# Patient Record
Sex: Male | Born: 1950
Health system: Southern US, Community
[De-identification: ages and names within clinical notes are randomized; demographics above are authoritative.]

## PROBLEM LIST (undated history)

## (undated) DIAGNOSIS — E785 Hyperlipidemia, unspecified: Secondary | ICD-10-CM

## (undated) DIAGNOSIS — I10 Essential (primary) hypertension: Secondary | ICD-10-CM

## (undated) DIAGNOSIS — K5792 Diverticulitis of intestine, part unspecified, without perforation or abscess without bleeding: Secondary | ICD-10-CM

## (undated) DIAGNOSIS — N200 Calculus of kidney: Secondary | ICD-10-CM

## (undated) DIAGNOSIS — E7800 Pure hypercholesterolemia, unspecified: Secondary | ICD-10-CM

## (undated) DIAGNOSIS — R319 Hematuria, unspecified: Secondary | ICD-10-CM

## (undated) DIAGNOSIS — L409 Psoriasis, unspecified: Secondary | ICD-10-CM

## (undated) DIAGNOSIS — L309 Dermatitis, unspecified: Secondary | ICD-10-CM

## (undated) DIAGNOSIS — R0602 Shortness of breath: Secondary | ICD-10-CM

## (undated) DIAGNOSIS — E119 Type 2 diabetes mellitus without complications: Secondary | ICD-10-CM

## (undated) HISTORY — DX: Type 2 diabetes mellitus without complications: E11.9

## (undated) HISTORY — DX: Hyperlipidemia, unspecified: E78.5

## (undated) HISTORY — DX: Psoriasis, unspecified: L40.9

## (undated) HISTORY — DX: Calculus of kidney: N20.0

## (undated) HISTORY — PX: TRIGGER FINGER RELEASE: SHX641

## (undated) HISTORY — DX: Diverticulitis of intestine, part unspecified, without perforation or abscess without bleeding: K57.92

## (undated) HISTORY — PX: NECK SURGERY: SHX720

## (undated) HISTORY — DX: Pure hypercholesterolemia, unspecified: E78.00

## (undated) HISTORY — DX: Shortness of breath: R06.02

## (undated) HISTORY — DX: Hematuria, unspecified: R31.9

## (undated) HISTORY — DX: Essential (primary) hypertension: I10

## (undated) HISTORY — PX: LEFT HEART CATH: SHX5946

## (undated) HISTORY — DX: Dermatitis, unspecified: L30.9

---

## 2006-04-05 ENCOUNTER — Ambulatory Visit (HOSPITAL_COMMUNITY): Admission: RE | Admit: 2006-04-05 | Discharge: 2006-04-06 | Payer: Self-pay | Admitting: Neurosurgery

## 2007-01-09 ENCOUNTER — Ambulatory Visit: Payer: Self-pay | Admitting: Cardiology

## 2008-05-27 ENCOUNTER — Ambulatory Visit (HOSPITAL_COMMUNITY): Admission: RE | Admit: 2008-05-27 | Discharge: 2008-05-27 | Payer: Self-pay | Admitting: Ophthalmology

## 2011-04-27 NOTE — Op Note (Signed)
Albert Peterson, Peterson                ACCOUNT NO.:  [id number]   MEDICAL RECORD NO.:  [id number]          PATIENT TYPE:  AMB   LOCATION:  SDS                          FACILITY:  [facility]   PHYSICIAN:  Coletta Memos, M.D.     DATE OF BIRTH:  1951-11-24   DATE OF PROCEDURE:  04/05/2006  DATE OF DISCHARGE:                                 OPERATIVE REPORT   PREOPERATIVE DIAGNOSIS:  1.  Displaced disk left C6-C7.  2.  Left C7 radiculopathy.   POSTOPERATIVE DIAGNOSES:  1.  Displaced disk left C6-C7.  2.  Left C7 radiculopathy.   PROCEDURE:  1.  Anterior cervical decompression C6-C7.  2.  Arthrodesis C6-C7 with Peak interbody grafts 9 mm Synthes filled with      DBX bone putty.  3.  Anterior plating using 16 mm Synthes ACS plate with 04-VW screws.   COMPLICATIONS:  None.   SURGEON:  Coletta Memos, M.D.   ASSISTANT:  Hilda Lias, M.D.   INDICATIONS:  Albert Peterson is a gentleman who presented with severe pain  in his left upper extremity secondary to a displaced disk on MRI at C6-C7.  I recommended and he agreed to undergo operative decompression after  conservative treatment did not provide satisfactory relief.   OPERATIVE NOTE:  Albert Peterson was brought to the operating room, intubated, and  placed under a general anesthetic without difficulty.  His head was  positioned in slight extension on a horseshoe headrest.  His neck was  prepped and he was draped in sterile fashion.  I infiltrated 4 mL 0.5%  lidocaine with 1:200,000 strength epinephrine into the cervical region  starting from the midline extending to the medial border of the left  sternocleidomastoid approximately a fingerbreadth below the cricothyroid  membrane.  I opened the skin with a #10 blade and took this down through the  soft tissue to the platysma.  I dissected in a plane superior to the  platysma rostrally and caudally.  I then opened the platysma in a horizontal  fashion.  I dissected inferior to the platysma  rostrally and caudally to  free up soft tissue in the incision.  I then created an avascular plane to  the cervical spine.  I was able to appreciate the carotid artery and  retracted that laterally and the medial strap muscles medially.  I was  caudal enough that I took the omohyoid laterally, also.  I placed a spinal  needle and it appeared to be at C5-C6 but it was not a clear picture.  So, I  placed another spinal needle and that was at C4-C5 and I could, therefore,  count down to the C6-C7 level where I needed to be.  I then took a Financial risk analyst and removed what was a very large osteophyte anteriorly and bridging  the disk space at C6-C7.  I then reflected the longus colli muscles and  placed a self-retaining retractor.  I then used a 15 blade to open the disk  space and with the use of curets and Kerrison punches, 1 and 2 pituitary  rongeurs, I  removed a significant amount of disk material.  The microscope  was then brought into the operative field and again with curettes and a high-  speed drill, I removed bone, spurs, and ligament, until I was at the  posterior longitudinal ligament.  I then opened that ligament with a 1  Kerrison punch.  I then enlarged the opening using successively larger  Kerrison punches.  The thecal sac was exposed and then I decompressed both  C7 nerve roots.  I then irrigated out the wound.  I then sized first an 8 mm  Peak graft and frankly it appeared to be too loose.  I then placed a 9 mm  graft over DBX bone putty.  Distraction pins had been placed, one at C6-1  and one at C7.  They were then removed after I placed the interbody device.   I then sized the plate and used 16 mm.  Four screws were placed, first by  drilling and the screws were all self-tapping, and this was done with Dr.  Cassandria Santee assistance.  We did that until good purchase was achieved to the  plate.  I then irrigated the wound.  I then closed the wound in a layered  fashion.  Since I  was not able to see C6-C7 on the initial x-ray, I did not  take an x-ray postop.  I reapproximated the platysma, subcutaneous, and  subcuticular layers.  Dermabond was used for a sterile dressing.  The  patient tolerated procedure well.           ______________________________  Coletta Memos, M.D.     KC/MEDQ  D:  04/05/2006  T:  04/06/2006  Job:  [id number]

## 2011-09-06 LAB — BASIC METABOLIC PANEL
BUN: 14
Chloride: 106
Potassium: 3.8
Sodium: 138

## 2013-12-08 ENCOUNTER — Other Ambulatory Visit (HOSPITAL_COMMUNITY): Payer: Self-pay

## 2013-12-08 ENCOUNTER — Encounter (HOSPITAL_COMMUNITY): Payer: Self-pay | Admitting: Pharmacy Technician

## 2013-12-09 NOTE — Patient Instructions (Signed)
Your procedure is scheduled on:  12/17/13  Report to [facility] at 06:30 AM.  Call this number if you have problems the morning of surgery: [phone]   Remember:   Do not eat or drink:After Midnight.  Take these medicines the morning of surgery with A SIP OF WATER: None   Do not wear jewelry, make-up or nail polish.  Do not wear lotions, powders, or perfumes. You may wear deodorant.  Do not bring valuables to the hospital.  Contacts, dentures or bridgework may not be worn into surgery.   Patients discharged the day of surgery will not be allowed to drive home.  Special Instructions: Start using your eye drops as prescribed by your eye doctor.   Please read over the following fact sheets that you were given: Anesthesia Post-op Instructions and Care and Recovery After Surgery    Cataract Surgery  A cataract is a clouding of the lens of the eye. When a lens becomes cloudy, vision is reduced based on the degree and nature of the clouding. Surgery may be needed to improve vision. Surgery removes the cloudy lens and usually replaces it with a substitute lens (intraocular lens, IOL). LET YOUR EYE DOCTOR KNOW ABOUT:  Allergies to food or medicine.   Medicines taken including herbs, eyedrops, over-the-counter medicines, and creams.   Use of steroids (by mouth or creams).   Previous problems with anesthetics or numbing medicine.   History of bleeding problems or blood clots.   Previous surgery.   Other health problems, including diabetes and kidney problems.   Possibility of pregnancy, if this applies.  RISKS AND COMPLICATIONS  Infection.   Inflammation of the eyeball (endophthalmitis) that can spread to both eyes (sympathetic ophthalmia).   Poor wound healing.   If an IOL is inserted, it can later fall out of proper position. This is very uncommon.   Clouding of the part of your eye that holds an IOL in place. This is called an "after-cataract." These are uncommon, but easily  treated.  BEFORE THE PROCEDURE  Do not eat or drink anything except small amounts of water for 8 to 12 before your surgery, or as directed by your caregiver.   Unless you are told otherwise, continue any eyedrops you have been prescribed.   Talk to your primary caregiver about all other medicines that you take (both prescription and non-prescription). In some cases, you may need to stop or change medicines near the time of your surgery. This is most important if you are taking blood-thinning medicine.Do not stop medicines unless you are told to do so.   Arrange for someone to drive you to and from the procedure.   Do not put contact lenses in either eye on the day of your surgery.  PROCEDURE There is more than one method for safely removing a cataract. Your doctor can explain the differences and help determine which is best for you. Phacoemulsification surgery is the most common form of cataract surgery.  An injection is given behind the eye or eyedrops are given to make this a painless procedure.   A small cut (incision) is made on the edge of the clear, dome-shaped surface that covers the front of the eye (cornea).   A tiny probe is painlessly inserted into the eye. This device gives off ultrasound waves that soften and break up the cloudy center of the lens. This makes it easier for the cloudy lens to be removed by suction.   An IOL may be implanted.  The normal lens of the eye is covered by a clear capsule. Part of that capsule is intentionally left in the eye to support the IOL.   Your surgeon may or may not use stitches to close the incision.  There are other forms of cataract surgery that require a larger incision and stiches to close the eye. This approach is taken in cases where the doctor feels that the cataract cannot be easily removed using phacoemulsification. AFTER THE PROCEDURE  When an IOL is implanted, it does not need care. It becomes a permanent part of your eye  and cannot be seen or felt.   Your doctor will schedule follow-up exams to check on your progress.   Review your other medicines with your doctor to see which can be resumed after surgery.   Use eyedrops or take medicine as prescribed by your doctor.  Document Released: 11/15/2011 Document Reviewed: 11/12/2011 [facility] Patient Information 2012 [place], [place].   PATIENT INSTRUCTIONS POST-ANESTHESIA  IMMEDIATELY FOLLOWING SURGERY:  Do not drive or operate machinery for the first twenty four hours after surgery.  Do not make any important decisions for twenty four hours after surgery or while taking narcotic pain medications or sedatives.  If you develop intractable nausea and vomiting or a severe headache please notify your doctor immediately.  FOLLOW-UP:  Please make an appointment with your surgeon as instructed. You do not need to follow up with anesthesia unless specifically instructed to do so.  WOUND CARE INSTRUCTIONS (if applicable):  Keep a dry clean dressing on the anesthesia/puncture wound site if there is drainage.  Once the wound has quit draining you may leave it open to air.  Generally you should leave the bandage intact for twenty four hours unless there is drainage.  If the epidural site drains for more than 36-48 hours please call the anesthesia department.  QUESTIONS?:  Please feel free to call your physician or the hospital operator if you have any questions, and they will be happy to assist you.

## 2013-12-11 ENCOUNTER — Encounter (HOSPITAL_COMMUNITY): Payer: Self-pay

## 2013-12-11 ENCOUNTER — Encounter (HOSPITAL_COMMUNITY)
Admission: RE | Admit: 2013-12-11 | Discharge: 2013-12-11 | Disposition: A | Discharge status: Home / Self Care | Payer: BC Managed Care – PPO | Source: Ambulatory Visit | Attending: Ophthalmology | Admitting: Ophthalmology

## 2013-12-11 ENCOUNTER — Other Ambulatory Visit: Payer: Self-pay

## 2013-12-11 DIAGNOSIS — Z01818 Encounter for other preprocedural examination: Secondary | ICD-10-CM | POA: Insufficient documentation

## 2013-12-11 DIAGNOSIS — Z01812 Encounter for preprocedural laboratory examination: Secondary | ICD-10-CM | POA: Insufficient documentation

## 2013-12-11 LAB — BASIC METABOLIC PANEL
BUN: 16 mg/dL (ref 6–23)
CO2: 26 mEq/L (ref 19–32)
Calcium: 9.5 mg/dL (ref 8.4–10.5)
Chloride: 104 mEq/L (ref 96–112)
Creatinine, Ser: 1.23 mg/dL (ref 0.50–1.35)
GFR calc Af Amer: 70 mL/min — ABNORMAL LOW (ref >90)
GFR, EST NON AFRICAN AMERICAN: 60 mL/min — AB (ref >90)
GLUCOSE: 152 mg/dL — AB (ref 70–99)
POTASSIUM: 4.3 meq/L (ref 3.7–5.3)
Sodium: 138 mEq/L (ref 137–147)

## 2013-12-11 LAB — HEMOGLOBIN AND HEMATOCRIT, BLOOD
HCT: 44.2 % (ref 39.0–52.0)
HEMOGLOBIN: 15 g/dL (ref 13.0–17.0)

## 2013-12-16 MED ORDER — PHENYLEPHRINE HCL 2.5 % OP SOLN
OPHTHALMIC | Status: AC
  Filled 2013-12-16: qty 15

## 2013-12-16 MED ORDER — LIDOCAINE HCL (PF) 1 % IJ SOLN
INTRAMUSCULAR | Status: AC
  Filled 2013-12-16: qty 2

## 2013-12-16 MED ORDER — NEOMYCIN-POLYMYXIN-DEXAMETH 3.5-10000-0.1 OP SUSP
OPHTHALMIC | Status: AC
  Filled 2013-12-16: qty 5

## 2013-12-16 MED ORDER — LIDOCAINE HCL 3.5 % OP GEL
OPHTHALMIC | Status: AC
  Filled 2013-12-16: qty 1

## 2013-12-16 MED ORDER — CYCLOPENTOLATE-PHENYLEPHRINE OP SOLN OPTIME - NO CHARGE
OPHTHALMIC | Status: AC
  Filled 2013-12-16: qty 2

## 2013-12-16 MED ORDER — TETRACAINE HCL 0.5 % OP SOLN
OPHTHALMIC | Status: AC
  Filled 2013-12-16: qty 2

## 2013-12-17 ENCOUNTER — Encounter (HOSPITAL_COMMUNITY): Payer: BC Managed Care – PPO | Admitting: Anesthesiology

## 2013-12-17 ENCOUNTER — Ambulatory Visit (HOSPITAL_COMMUNITY): Payer: BC Managed Care – PPO | Admitting: Anesthesiology

## 2013-12-17 ENCOUNTER — Encounter (HOSPITAL_COMMUNITY): Payer: Self-pay | Admitting: *Deleted

## 2013-12-17 ENCOUNTER — Ambulatory Visit (HOSPITAL_COMMUNITY)
Admission: RE | Admit: 2013-12-17 | Discharge: 2013-12-17 | Disposition: A | Discharge status: Home / Self Care | Payer: BC Managed Care – PPO | Source: Ambulatory Visit | Attending: Ophthalmology | Admitting: Ophthalmology

## 2013-12-17 ENCOUNTER — Encounter (HOSPITAL_COMMUNITY)
Admission: RE | Disposition: A | Discharge status: Home / Self Care | Payer: Self-pay | Source: Ambulatory Visit | Attending: Ophthalmology

## 2013-12-17 DIAGNOSIS — I10 Essential (primary) hypertension: Secondary | ICD-10-CM | POA: Insufficient documentation

## 2013-12-17 DIAGNOSIS — H2589 Other age-related cataract: Secondary | ICD-10-CM | POA: Insufficient documentation

## 2013-12-17 HISTORY — PX: CATARACT EXTRACTION W/PHACO: SHX586

## 2013-12-17 SURGERY — PHACOEMULSIFICATION, CATARACT, WITH IOL INSERTION
Anesthesia: Monitor Anesthesia Care | Site: Eye | Laterality: Right

## 2013-12-17 MED ORDER — BSS IO SOLN
INTRAOCULAR | Status: DC | PRN
Start: 2013-12-17 — End: 2013-12-17
  Administered 2013-12-17: 15 mL via INTRAOCULAR

## 2013-12-17 MED ORDER — LIDOCAINE HCL (PF) 1 % IJ SOLN
INTRAMUSCULAR | Status: DC | PRN
Start: 2013-12-17 — End: 2013-12-17
  Administered 2013-12-17: .4 mL

## 2013-12-17 MED ORDER — LACTATED RINGERS IV SOLN
INTRAVENOUS | Status: DC | PRN
  Administered 2013-12-17: 07:00:00 via INTRAVENOUS

## 2013-12-17 MED ORDER — FENTANYL CITRATE 0.05 MG/ML IJ SOLN: 25.0000 ug | INTRAMUSCULAR | Status: DC | PRN

## 2013-12-17 MED ORDER — FENTANYL CITRATE 0.05 MG/ML IJ SOLN
INTRAMUSCULAR | Status: AC
  Filled 2013-12-17: qty 2

## 2013-12-17 MED ORDER — EPINEPHRINE HCL 1 MG/ML IJ SOLN
INTRAMUSCULAR | Status: AC
  Filled 2013-12-17: qty 1

## 2013-12-17 MED ORDER — CYCLOPENTOLATE-PHENYLEPHRINE 0.2-1 % OP SOLN
1.0000 [drp] | OPHTHALMIC | Status: AC
  Administered 2013-12-17 (×3): 1 [drp] via OPHTHALMIC

## 2013-12-17 MED ORDER — ONDANSETRON HCL 4 MG/2ML IJ SOLN
4.0000 mg | Freq: Once | INTRAMUSCULAR | Status: AC | PRN
Start: 2013-12-17 — End: 2013-12-17

## 2013-12-17 MED ORDER — TETRACAINE HCL 0.5 % OP SOLN
1.0000 [drp] | OPHTHALMIC | Status: AC
  Administered 2013-12-17 (×3): 1 [drp] via OPHTHALMIC

## 2013-12-17 MED ORDER — FENTANYL CITRATE 0.05 MG/ML IJ SOLN
25.0000 ug | INTRAMUSCULAR | Status: AC
  Administered 2013-12-17 (×2): 25 ug via INTRAVENOUS

## 2013-12-17 MED ORDER — MIDAZOLAM HCL 2 MG/2ML IJ SOLN
INTRAMUSCULAR | Status: AC
  Filled 2013-12-17: qty 2

## 2013-12-17 MED ORDER — LIDOCAINE HCL 3.5 % OP GEL
1.0000 "application " | Freq: Once | OPHTHALMIC | Status: AC
  Administered 2013-12-17: 1 via OPHTHALMIC

## 2013-12-17 MED ORDER — NEOMYCIN-POLYMYXIN-DEXAMETH 3.5-10000-0.1 OP SUSP
OPHTHALMIC | Status: DC | PRN
  Administered 2013-12-17: 2 [drp] via OPHTHALMIC

## 2013-12-17 MED ORDER — EPINEPHRINE HCL 1 MG/ML IJ SOLN
INTRAMUSCULAR | Status: DC | PRN
  Administered 2013-12-17: 08:00:00

## 2013-12-17 MED ORDER — LACTATED RINGERS IV SOLN
INTRAVENOUS | Status: DC
Start: 2013-12-17 — End: 2013-12-19
  Administered 2013-12-17: 08:00:00 via INTRAVENOUS

## 2013-12-17 MED ORDER — PHENYLEPHRINE HCL 2.5 % OP SOLN
1.0000 [drp] | OPHTHALMIC | Status: AC
  Administered 2013-12-17 (×3): 1 [drp] via OPHTHALMIC

## 2013-12-17 MED ORDER — PROVISC 10 MG/ML IO SOLN
INTRAOCULAR | Status: DC | PRN
Start: 2013-12-17 — End: 2013-12-17
  Administered 2013-12-17: 0.85 mL via INTRAOCULAR

## 2013-12-17 MED ORDER — POVIDONE-IODINE 5 % OP SOLN
OPHTHALMIC | Status: DC | PRN
  Administered 2013-12-17: 1 via OPHTHALMIC

## 2013-12-17 MED ORDER — MIDAZOLAM HCL 2 MG/2ML IJ SOLN
1.0000 mg | INTRAMUSCULAR | Status: DC | PRN
  Administered 2013-12-17: 2 mg via INTRAVENOUS

## 2013-12-17 SURGICAL SUPPLY — 33 items
CAPSULAR TENSION RING-AMO (OPHTHALMIC RELATED) IMPLANT
CLOTH BEACON ORANGE TIMEOUT ST (SAFETY) ×2 IMPLANT
EYE SHIELD UNIVERSAL CLEAR (GAUZE/BANDAGES/DRESSINGS) ×2 IMPLANT
GLOVE BIO SURGEON STRL SZ 6.5 (GLOVE) IMPLANT
GLOVE BIO SURGEONS STRL SZ 6.5 (GLOVE)
GLOVE BIOGEL PI IND STRL 6.5 (GLOVE) IMPLANT
GLOVE BIOGEL PI IND STRL 7.0 (GLOVE) IMPLANT
GLOVE BIOGEL PI IND STRL 7.5 (GLOVE) IMPLANT
GLOVE BIOGEL PI INDICATOR 6.5 (GLOVE)
GLOVE BIOGEL PI INDICATOR 7.0 (GLOVE) ×4
GLOVE BIOGEL PI INDICATOR 7.5 (GLOVE)
GLOVE ECLIPSE 6.5 STRL STRAW (GLOVE) IMPLANT
GLOVE ECLIPSE 7.0 STRL STRAW (GLOVE) IMPLANT
GLOVE ECLIPSE 7.5 STRL STRAW (GLOVE) IMPLANT
GLOVE EXAM NITRILE LRG STRL (GLOVE) IMPLANT
GLOVE EXAM NITRILE MD LF STRL (GLOVE) IMPLANT
GLOVE SKINSENSE NS SZ6.5 (GLOVE)
GLOVE SKINSENSE NS SZ7.0 (GLOVE)
GLOVE SKINSENSE STRL SZ6.5 (GLOVE) IMPLANT
GLOVE SKINSENSE STRL SZ7.0 (GLOVE) IMPLANT
KIT VITRECTOMY (OPHTHALMIC RELATED) IMPLANT
PAD ARMBOARD 7.5X6 YLW CONV (MISCELLANEOUS) ×2 IMPLANT
PROC W NO LENS (INTRAOCULAR LENS)
PROC W SPEC LENS (INTRAOCULAR LENS)
PROCESS W NO LENS (INTRAOCULAR LENS) IMPLANT
PROCESS W SPEC LENS (INTRAOCULAR LENS) IMPLANT
RING MALYGIN (MISCELLANEOUS) IMPLANT
SIGHTPATH CAT PROC W REG LENS (Ophthalmic Related) ×3 IMPLANT
SYR TB 1ML LL NO SAFETY (SYRINGE) ×2 IMPLANT
TAPE SURG TRANSPORE 1 IN (GAUZE/BANDAGES/DRESSINGS) IMPLANT
TAPE SURGICAL TRANSPORE 1 IN (GAUZE/BANDAGES/DRESSINGS) ×2
VISCOELASTIC ADDITIONAL (OPHTHALMIC RELATED) IMPLANT
WATER STERILE IRR 250ML POUR (IV SOLUTION) ×2 IMPLANT

## 2013-12-17 NOTE — Preoperative (Signed)
Beta Blockers   Reason not to administer Beta Blockers:Not Applicable 

## 2013-12-17 NOTE — Op Note (Signed)
Date of Admission: 12/17/2013  Date of Surgery: 12/17/2013  Pre-Op Dx: Cataract  Right  Eye  Post-Op Dx: Combined Cataract  Right  Eye,  Dx Code 366.19  Surgeon: Tonny Branch, M.D.  Assistants: None  Anesthesia: Topical with MAC  Indications: Painless, progressive loss of vision with compromise of daily activities.  Surgery: Cataract Extraction with Intraocular lens Implant Right Eye  Discription: The patient had dilating drops and viscous lidocaine placed into the right eye in the pre-op holding area. After transfer to the operating room, a time out was performed. The patient was then prepped and draped. Beginning with a 65 degree blade a paracentesis port was made at the surgeon's 2 o'clock position. The anterior chamber was then filled with 1% non-preserved lidocaine. This was followed by filling the anterior chamber with Provisc.  A 2.85mm keratome blade was used to make a clear corneal incision at the temporal limbus.  A bent cystatome needle was used to create a continuous tear capsulotomy. Hydrodissection was performed with balanced salt solution on a Fine canula. The lens nucleus was then removed using the phacoemulsification handpiece. Residual cortex was removed with the I&A handpiece. The anterior chamber and capsular bag were refilled with Provisc. A posterior chamber intraocular lens was placed into the capsular bag with it's injector. The implant was positioned with the Kuglan hook. The Provisc was then removed from the anterior chamber and capsular bag with the I&A handpiece. Stromal hydration of the main incision and paracentesis port was performed with BSS on a Fine canula. The wounds were tested for leak which was negative. The patient tolerated the procedure well. There were no operative complications. The patient was then transferred to the recovery room in stable condition.  Complications: None  Specimen: None  EBL: None  Prosthetic device: B&L enVista, MX60, power 19.0D, SN  [id number].

## 2013-12-17 NOTE — Anesthesia Postprocedure Evaluation (Signed)
  Anesthesia Post-op Note  Patient: Albert Peterson  Procedure(s) Performed: Procedure(s): CATARACT EXTRACTION PHACO AND INTRAOCULAR LENS PLACEMENT (IOC) CDE=13.21 (Right)  Patient Location: Short Stay  Anesthesia Type:MAC  Level of Consciousness: awake, alert , oriented and patient cooperative  Airway and Oxygen Therapy: Patient Spontanous Breathing  Post-op Pain: none  Post-op Assessment: Post-op Vital signs reviewed, Patient's Cardiovascular Status Stable, Respiratory Function Stable, Patent Airway, No signs of Nausea or vomiting and Pain level controlled  Post-op Vital Signs: Reviewed and stable  Complications: No apparent anesthesia complications

## 2013-12-17 NOTE — Anesthesia Preprocedure Evaluation (Signed)
Anesthesia Evaluation  Patient identified by MRN, date of birth, ID band Patient awake    Reviewed: Allergy & Precautions, H&P , NPO status , Patient's Chart, lab work & pertinent test results  Airway Mallampati: II TM Distance: >3 FB     Dental  (+) Edentulous Upper and Edentulous Lower   Pulmonary former smoker,  breath sounds clear to auscultation        Cardiovascular hypertension (not taking meds), Rhythm:Regular Rate:Normal     Neuro/Psych    GI/Hepatic negative GI ROS,   Endo/Other    Renal/GU      Musculoskeletal   Abdominal   Peds  Hematology   Anesthesia Other Findings   Reproductive/Obstetrics                           Anesthesia Physical Anesthesia Plan  ASA: II  Anesthesia Plan: MAC   Post-op Pain Management:    Induction: Intravenous  Airway Management Planned: Nasal Cannula  Additional Equipment:   Intra-op Plan:   Post-operative Plan:   Informed Consent: I have reviewed the patients History and Physical, chart, labs and discussed the procedure including the risks, benefits and alternatives for the proposed anesthesia with the patient or authorized representative who has indicated his/her understanding and acceptance.     Plan Discussed with:   Anesthesia Plan Comments:         Anesthesia Quick Evaluation

## 2013-12-17 NOTE — Anesthesia Procedure Notes (Signed)
Procedure Name: MAC Date/Time: 12/17/2013 7:59 AM Performed by: Andree Elk, AMY A Pre-anesthesia Checklist: Patient identified, Timeout performed, Emergency Drugs available, Suction available and Patient being monitored Patient Re-evaluated:Patient Re-evaluated prior to inductionOxygen Delivery Method: Nasal cannula

## 2013-12-17 NOTE — Transfer of Care (Signed)
Immediate Anesthesia Transfer of Care Note  Patient: Albert Peterson  Procedure(s) Performed: Procedure(s): CATARACT EXTRACTION PHACO AND INTRAOCULAR LENS PLACEMENT (IOC) CDE=13.21 (Right)  Patient Location: Short Stay  Anesthesia Type:MAC  Level of Consciousness: awake, alert , oriented and patient cooperative  Airway & Oxygen Therapy: Patient Spontanous Breathing  Post-op Assessment: Report given to PACU RN and Post -op Vital signs reviewed and stable  Post vital signs: Reviewed and stable  Complications: No apparent anesthesia complications

## 2013-12-17 NOTE — H&P (Signed)
I have reviewed the H&P, the patient was re-examined, and I have identified no interval changes in medical condition and plan of care since the history and physical of record  

## 2013-12-21 ENCOUNTER — Encounter (HOSPITAL_COMMUNITY): Payer: Self-pay | Admitting: Ophthalmology

## 2014-12-15 DIAGNOSIS — K859 Acute pancreatitis without necrosis or infection, unspecified: Secondary | ICD-10-CM | POA: Insufficient documentation

## 2015-07-25 ENCOUNTER — Encounter: Payer: Self-pay | Admitting: Pulmonary Disease

## 2015-07-26 ENCOUNTER — Encounter: Payer: Self-pay | Admitting: Pulmonary Disease

## 2015-07-26 ENCOUNTER — Ambulatory Visit (INDEPENDENT_AMBULATORY_CARE_PROVIDER_SITE_OTHER): Payer: BLUE CROSS/BLUE SHIELD | Admitting: Pulmonary Disease

## 2015-07-26 ENCOUNTER — Ambulatory Visit (INDEPENDENT_AMBULATORY_CARE_PROVIDER_SITE_OTHER)
Admission: RE | Admit: 2015-07-26 | Discharge: 2015-07-26 | Disposition: A | Discharge status: Home / Self Care | Payer: BLUE CROSS/BLUE SHIELD | Source: Ambulatory Visit | Attending: Pulmonary Disease | Admitting: Pulmonary Disease

## 2015-07-26 ENCOUNTER — Telehealth: Payer: Self-pay | Admitting: Pulmonary Disease

## 2015-07-26 VITALS — BP 130/70 | HR 65 | Ht 68.5 in | Wt 231.4 lb

## 2015-07-26 DIAGNOSIS — E785 Hyperlipidemia, unspecified: Secondary | ICD-10-CM | POA: Insufficient documentation

## 2015-07-26 DIAGNOSIS — I208 Other forms of angina pectoris: Secondary | ICD-10-CM | POA: Diagnosis not present

## 2015-07-26 DIAGNOSIS — R0600 Dyspnea, unspecified: Secondary | ICD-10-CM

## 2015-07-26 DIAGNOSIS — N281 Cyst of kidney, acquired: Secondary | ICD-10-CM | POA: Insufficient documentation

## 2015-07-26 DIAGNOSIS — I10 Essential (primary) hypertension: Secondary | ICD-10-CM | POA: Insufficient documentation

## 2015-07-26 DIAGNOSIS — I2089 Other forms of angina pectoris: Secondary | ICD-10-CM

## 2015-07-26 DIAGNOSIS — L409 Psoriasis, unspecified: Secondary | ICD-10-CM | POA: Insufficient documentation

## 2015-07-26 DIAGNOSIS — K5732 Diverticulitis of large intestine without perforation or abscess without bleeding: Secondary | ICD-10-CM | POA: Insufficient documentation

## 2015-07-26 DIAGNOSIS — N200 Calculus of kidney: Secondary | ICD-10-CM | POA: Insufficient documentation

## 2015-07-26 NOTE — Telephone Encounter (Signed)
Not entered in error.

## 2015-07-26 NOTE — Patient Instructions (Signed)
1. We are checking breathing tests to determine if you have COPD. 2. Albert Peterson doing a chest x-ray today to make sure you don't have any underlying abnormality in your lungs that could be contributing to your shortness of breath. 3. Please contact her cardiologist office in the next week or so to inquire as to their thoughts regarding Prinzmetal's angina. 4. You will return to clinic in 2-4 weeks to review your test results but please call me if you have any further questions or concerns.

## 2015-07-26 NOTE — Progress Notes (Signed)
Subjective:    Patient ID: Albert Peterson, male    DOB: 1951-05-06, 64 y.o.   MRN: [id number]  HPI He reports he started noticing dyspnea on exertion about a year ago.  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  He reports this seems to be intermittent.  He reports he has slowed down to try to prevent this.  He denies any worsening of his breathing with exposure to dust, chemical fumes, or perfumes. He denies any history of allergies or recurrent bronchitis. He denies any prior history of pneumonia. As a child he had no breathing problems. No allergies or eczema as a child. He does report his breathing seems to be worse when exposed to the increased heat & humidity recently. He has also developed an intermittent cough productive of mucus that he cannot qualify or quantify.  He denies any wheezing. He reports he can walk at least 3 blocks without stopping during a recent trip. He underwent a stress test recently that was abnormal but then underwent a left heart catheterization that was reportedly normal. He reports intermittent, "sharp" pain with exertion that is relieved with rest and resolves spontaneously. This is always associated with dyspnea as well. He denies any pleurisy. No fever, chills, or sweats. No nausea, emesis or diarrhea. He reports he has chronic back pain from "bulging discs". Denies any other joint pain, stiffness, or swelling.    Review of Systems No bruising. Has a rash on his leg that has been present for years and reportedly is "psoriasis". No dysuria or hematuria. A pertinent 14 point review of systems is negative except as per the history of presenting illness.  No Known Allergies  Current Outpatient Prescriptions on File Prior to Visit  Medication Sig Dispense Refill  . aspirin 81 MG tablet Take 81 mg by mouth daily.    Marland Kitchen lisinopril (PRINIVIL,ZESTRIL) 20 MG tablet Take 20 mg by mouth daily.    . metoprolol succinate (TOPROL-XL) 25 MG 24 hr  tablet Take 25 mg by mouth daily.     No current facility-administered medications on file prior to visit.   Past Medical History  Diagnosis Date  . Benign essential hypertension   . Hypercholesteremia   . DM2 (diabetes mellitus, type 2)   . Diverticulitis   . Eczema   . Hematuria   . Hyperlipidemia   . SOB (shortness of breath)   . Nephrolithiasis   . Psoriasis     right leg   Past Surgical History  Procedure Laterality Date  . Neck surgery    . Cataract extraction w/phaco Right 12/17/2013    Procedure: CATARACT EXTRACTION PHACO AND INTRAOCULAR LENS PLACEMENT (IOC) CDE=13.21;  Surgeon: Tonny Branch, MD;  Location: [facility];  Service: Ophthalmology;  Laterality: Right;  . Trigger finger release    . Left heart cath     Family History  Problem Relation Age of Onset  . Colon cancer Father   . Cancer Mother     ? type  . Breast cancer Maternal Aunt   . Lung disease Neg Hx    Social History   Social History  . Marital Status: Married    Spouse Name: N/A  . Number of Children: 1  . Years of Education: N/A   Occupational History  . shipping and receiving     Social History Main Topics  . Smoking status: Former Smoker -- 1.50 packs/day for 15 years    Types: Cigarettes  Start date: 12/11/1963    Quit date: 12/10/1993  . Smokeless tobacco: None     Comment: Quit smoking for 13 years at one point  . Alcohol Use: No  . Drug Use: No  . Sexual Activity: Not Asked   Other Topics Concern  . None   Social History Narrative   Originally from [place]. He moved to [place] in 1973. He has only lived here and [place]. He has traveled to [place], [place], [place], [place], [place], and states along the way to [place]. No international travel. 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 No history of exposure to exotic lumber. Has a dog at home. No bird, mold, or hot tub exposure. 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 No known asbestos exposure.         Objective:   Physical Exam Height 5' 8.5" (1.74 m), weight 231 lb 6.4 oz (104.962 kg). General:  Awake. Alert. No acute distress. Accompanied by wife today. Moderate central obesity..  Integument:  Warm & dry. No rash on exposed skin. No bruising. Lymphatics:  No appreciated cervical or supraclavicular lymphadenoapthy. HEENT:  Moist mucus membranes. No oral ulcers. No scleral injection or icterus. Endotracheal tube in place. PERRL. Cardiovascular:  Regular rate. No edema. No appreciable JVD.  Pulmonary:  Good aeration & clear to auscultation bilaterally. Symmetric chest wall expansion. No accessory muscle use. Abdomen: Soft. Normal bowel sounds. Protuberant. Grossly nontender. Musculoskeletal:  Normal bulk and tone. Hand grip strength 5/5 bilaterally. No joint deformity or effusion appreciated. Neurological:  CN 2-12 grossly in tact. No meningismus. Moving all 4 extremities equally. Symmetric patellar deep tendon reflexes. Psychiatric:  Mood and affect congruent. Speech normal rhythm, rate & tone.   LABS 12/15/14  BMP: 138/4.0/103/25/18/0.84/137/9.0 LFT: 4.0/7.2/0.5/83/16/16 CK: 94 CK-MB: 1.5 Troponin I: <0.01 BNP: 51 Amylase: 1281 Lipase: 1736 CBC: 13.8/14.7/44.5/258  IMAGING CXR PA/LAT 12/15/14 (personally reviewed by me): Low lung volumes. Questionable bilateral hilar fullness likely confounded by low lung volumes. No obvious parenchymal opacity or mass appreciated. No pleural effusion. Heart normal in size. Mediastinum otherwise normal in contour.  ABDOMINAL U/S 12/15/14 (per radiologist): Known left renal cyst. No gallbladder wall thickening or gallstone identified. No sonographic Murphy sign. Common bile duct 5 mm. Pancreas not well characterized in the setting of known pancreatitis.  CT ABDOMEN/PELVIS 12/15/14 (personally reviewed by me): Dependent atelectasis. No obvious pericardial effusion with limited views. Peripancreatic inflammation with trace free fluid identified. No evidence  of pancreatic duct dilatation. Moderate colonic diverticulosis noted. 4 mm right lower pole nephrolithiasis & 5.5 cm left lower pole renal cyst. No intraperitoneal free air.  CARDIAC NUCLEAR STRESS TEST (09/28/14): Abnormal stress test with moderate sized mild in intensity reversible perfusion defect noted in lateral wall suggesting moderate ischemia. Mildly reduced LV systolic function with calculated EF 46%. No specific wall motion abnormalities seen. Mild global hypokinesis. 1 mm horizontal ST depression noted with exercise on EKG portion. No chest pain reported. Appropriate hemodynamic response to exercise.    Assessment & Plan:  Patient is a 64 year old Caucasian male with a remote history of tobacco use but approximately a 22 year pack history. 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 Certainly the patient's history would be concerning for myocardial ischemia, but given his recent history of a normal left heart catheterization this can be safely ruled out. I remain concerned about the possibility for Prinzmetal's angina given the abnormal stress test from October  2015. Even so, the patient's dyspnea seems to be the major inciting factor for his chest discomfort. It seems to be affecting his ability to function with limited exertional/exercise capacity. COPD is certainly possible given his history but this will need to be further investigated with pulmonary function testing. I instructed the patient and his wife to contact my office if they've any further questions or concerns before his next appointment.  1. Dyspnea: Suspect underlying COPD. Checking full pulmonary function testing as well as 6 minute walk test before next appointment. Checking chest x-ray PA/LAT. 2. Angina of effort: Question possible variant angina/Prinzmetal angina. Defer to cardiology on further thoughts and recommendations. 3. Follow-up: Patient to return to clinic in 2-4 weeks to review his  results.

## 2015-08-11 ENCOUNTER — Telehealth: Payer: Self-pay | Admitting: Pulmonary Disease

## 2015-08-11 NOTE — Telephone Encounter (Signed)
I called spoke with spouse. She reports the PFT is already scheduled for 09/09/15. Nothing further needed

## 2015-09-09 ENCOUNTER — Ambulatory Visit: Payer: BLUE CROSS/BLUE SHIELD

## 2015-09-16 ENCOUNTER — Ambulatory Visit: Payer: BLUE CROSS/BLUE SHIELD | Admitting: Pulmonary Disease

## 2015-09-16 ENCOUNTER — Ambulatory Visit: Payer: BLUE CROSS/BLUE SHIELD

## 2015-10-21 ENCOUNTER — Telehealth: Payer: Self-pay | Admitting: Pulmonary Disease

## 2015-10-21 NOTE — Telephone Encounter (Signed)
That's fine with me. JN

## 2015-10-21 NOTE — Telephone Encounter (Signed)
Patient's wife says that she would like her husband to be seen the same day as the 6MW and PFT on 11/04/15. She said that she knows that Dr. Ashok Cordia is not working that day and requests that he see another provider the same day.  The only day he is off work is on Fridays.   Dr. Ashok Cordia, is it okay for patient to see another provider after the 6MW and PFT on the same day?  Please advise.

## 2015-10-21 NOTE — Telephone Encounter (Signed)
Spoke with patient's wife, she was under the impression that patient had to stop taking his inhalers a week before the test. Advised her that patient can use his inhalers, he just cannot use the inhalers the day of the appointment.  She said that she is okay with keeping the appointments the way they are scheduled.  She was just afraid that he would end up in the hospital if he couldn't use his inhalers.  Appointments staying same. Nothing further needed. Closing encounter

## 2015-11-04 ENCOUNTER — Ambulatory Visit (INDEPENDENT_AMBULATORY_CARE_PROVIDER_SITE_OTHER): Payer: BLUE CROSS/BLUE SHIELD | Admitting: Pulmonary Disease

## 2015-11-04 DIAGNOSIS — R0600 Dyspnea, unspecified: Secondary | ICD-10-CM

## 2015-11-04 DIAGNOSIS — I208 Other forms of angina pectoris: Secondary | ICD-10-CM

## 2015-11-04 DIAGNOSIS — I2089 Other forms of angina pectoris: Secondary | ICD-10-CM

## 2015-11-04 LAB — PULMONARY FUNCTION TEST
DL/VA % PRED: 114 %
DL/VA: 5.11 ml/min/mmHg/L
DLCO unc % pred: 80 %
DLCO unc: 23.95 ml/min/mmHg
FEF 25-75 POST: 4.56 L/s
FEF 25-75 Pre: 4.01 L/sec
FEF2575-%CHANGE-POST: 13 %
FEF2575-%PRED-POST: 182 %
FEF2575-%PRED-PRE: 160 %
FEV1-%Change-Post: 3 %
FEV1-%Pred-Post: 90 %
FEV1-%Pred-Pre: 87 %
FEV1-PRE: 2.76 L
FEV1-Post: 2.85 L
FEV1FVC-%CHANGE-POST: 2 %
FEV1FVC-%PRED-PRE: 115 %
FEV6-%Change-Post: 2 %
FEV6-%Pred-Post: 81 %
FEV6-%Pred-Pre: 79 %
FEV6-Post: 3.25 L
FEV6-Pre: 3.18 L
FEV6FVC-%Change-Post: 0 %
FEV6FVC-%Pred-Post: 105 %
FEV6FVC-%Pred-Pre: 104 %
FVC-%CHANGE-POST: 0 %
FVC-%PRED-POST: 76 %
FVC-%Pred-Pre: 75 %
FVC-POST: 3.25 L
FVC-PRE: 3.22 L
POST FEV1/FVC RATIO: 88 %
PRE FEV1/FVC RATIO: 86 %
Post FEV6/FVC ratio: 100 %
Pre FEV6/FVC Ratio: 99 %
RV % pred: 63 %
RV: 1.44 L
TLC % PRED: 69 %
TLC: 4.62 L

## 2015-11-04 NOTE — Progress Notes (Signed)
PFT done today. 

## 2015-11-04 NOTE — Progress Notes (Signed)
PFT 11/04/15: FVC 3.22 L (75%) FEV1 2.76 L (87%) FEV1/FVC 0.86 FEF 25-75 4.01 L (160%) no bronchodilator response TLC 4.62 L (69%) RV 63% ERV 30% DLCO uncorrected 80%  6MWT 11/04/15:  Walked 432 meters / Baseline Sat 94% on RA / Nadir Sat 94% on RA

## 2015-11-18 ENCOUNTER — Encounter: Payer: Self-pay | Admitting: Pulmonary Disease

## 2015-11-18 ENCOUNTER — Other Ambulatory Visit: Payer: BLUE CROSS/BLUE SHIELD

## 2015-11-18 ENCOUNTER — Ambulatory Visit (INDEPENDENT_AMBULATORY_CARE_PROVIDER_SITE_OTHER): Payer: BLUE CROSS/BLUE SHIELD | Admitting: Pulmonary Disease

## 2015-11-18 VITALS — BP 146/82 | HR 72 | Ht 68.0 in | Wt 230.2 lb

## 2015-11-18 DIAGNOSIS — R05 Cough: Secondary | ICD-10-CM | POA: Diagnosis not present

## 2015-11-18 DIAGNOSIS — J984 Other disorders of lung: Secondary | ICD-10-CM | POA: Diagnosis not present

## 2015-11-18 DIAGNOSIS — R0789 Other chest pain: Secondary | ICD-10-CM | POA: Diagnosis not present

## 2015-11-18 DIAGNOSIS — R059 Cough, unspecified: Secondary | ICD-10-CM

## 2015-11-18 NOTE — Patient Instructions (Signed)
1. I will contact you with your echocardiogram & test results. 2. We may need to do cardiopulmonary exercise testing depending upon the results. 3. Please call my office if you notice any new problems with your breathing before your next appointment.

## 2015-11-18 NOTE — Progress Notes (Signed)
Subjective:    Patient ID: Albert Peterson, male    DOB: 08-11-1951, 64 y.o.   MRN: [id number]  C.C.:  Follow-up for Dyspnea & Chest Pain.  HPI Dyspnea: No evidence for COPD on pulmonary function testing. Patient does have mild restriction on lung volumes. Reports he continues to cough producing a tan mucus. Recently he was treated with antibiotics with significant improvement in his dyspnea. He has now had return of his dyspnea on exertion that is intermittent. No fever, chills, or sweats. Doesn't recall if his cough resolved on the antibiotics. He did try a sample of Anoro at home without any improvement in symptoms.   Chest Pain:  He continues to have chest tightness with this dyspnea on exertion. This resolves with rest.  Review of Systems No fever, chills, or sweats. No nausea or emesis.  No Known Allergies  Current Outpatient Prescriptions on File Prior to Visit  Medication Sig Dispense Refill  . aspirin 81 MG tablet Take 81 mg by mouth daily.    Marland Kitchen lisinopril (PRINIVIL,ZESTRIL) 20 MG tablet Take 20 mg by mouth daily.    . metoprolol succinate (TOPROL-XL) 25 MG 24 hr tablet Take 25 mg by mouth daily.     No current facility-administered medications on file prior to visit.   Past Medical History  Diagnosis Date  . Benign essential hypertension   . Hypercholesteremia   . DM2 (diabetes mellitus, type 2) ([place])   . Diverticulitis   . Eczema   . Hematuria   . Hyperlipidemia   . SOB (shortness of breath)   . Nephrolithiasis   . Psoriasis     right leg   Past Surgical History  Procedure Laterality Date  . Neck surgery    . Cataract extraction w/phaco Right 12/17/2013    Procedure: CATARACT EXTRACTION PHACO AND INTRAOCULAR LENS PLACEMENT (IOC) CDE=13.21;  Surgeon: Tonny Branch, MD;  Location: [facility];  Service: Ophthalmology;  Laterality: Right;  . Trigger finger release    . Left heart cath     Family History  Problem Relation Age of Onset  . Colon cancer Father   . Cancer  Mother     ? type  . Breast cancer Maternal Aunt   . Lung disease Neg Hx    Social History   Social History  . Marital Status: Married    Spouse Name: N/A  . Number of Children: 1  . Years of Education: N/A   Occupational History  . shipping and receiving     Social History Main Topics  . Smoking status: Former Smoker -- 1.50 packs/day for 15 years    Types: Cigarettes    Start date: 12/11/1963    Quit date: 12/10/1993  . Smokeless tobacco: None     Comment: Quit smoking for 13 years at one point  . Alcohol Use: No  . Drug Use: No  . Sexual Activity: Not Asked   Other Topics Concern  . None   Social History Narrative   Originally from [place]. He moved to [place] in 1973. He has only lived here and [place]. He has traveled to [place], [place], [place], [place], [place], and states along the way to [place]. No international travel. 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 No history of exposure to exotic lumber. Has a dog at home. No bird, mold, or hot tub exposure. 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 No known asbestos exposure.  Objective:   Physical Exam BP 146/82 mmHg  Pulse 72  Ht 5\' 8"  (1.727 m)  Wt 230 lb 3.2 oz (104.418 kg)  BMI 35.01 kg/m2  SpO2 95% General:  Awake. Alert. No acute distress. Accompanied by wife today. Obese.  Integument:  Warm & dry. No rash on exposed skin. No bruising. Lymphatics:  No appreciated cervical or supraclavicular lymphadenoapthy. HEENT:  Moist mucus membranes. Minimal nasal turbinate swelling. No oral ulcers. PERRL. Cardiovascular:  Regular rate. No edema. No appreciable JVD.  Pulmonary: Clear bilaterally to auscultation. No accessory muscle use. Abdomen: Soft. Normal bowel sounds. Protuberant.  Musculoskeletal:  Normal bulk and tone. Hand grip strength 5/5 bilaterally. No joint deformity or effusion appreciated.  PFT 11/04/15: FVC 3.22 L (75%) FEV1 2.76 L (87%) FEV1/FVC 0.86 FEF 25-75 4.01 L  (160%) no bronchodilator response TLC 4.62 L (69%) RV 63% ERV 30% DLCO uncorrected 80%  6MWT 11/04/15: Walked 432 meters / Baseline Sat 94% on RA / Nadir Sat 94% on RA  IMAGING CXR PA/LAT 07/26/15 (personally reviewed by me): Low lung volumes. No nodule or opacity appreciated. Heart normal in size. Mediastinum normal in contour.  CXR PA/LAT 12/15/14 (previously reviewed by me): Low lung volumes. Questionable bilateral hilar fullness likely confounded by low lung volumes. No obvious parenchymal opacity or mass appreciated. No pleural effusion. Heart normal in size. Mediastinum otherwise normal in contour.  ABDOMINAL U/S 12/15/14 (per radiologist): Known left renal cyst. No gallbladder wall thickening or gallstone identified. No sonographic Murphy sign. Common bile duct 5 mm. Pancreas not well characterized in the setting of known pancreatitis.  CT ABDOMEN/PELVIS 12/15/14 (previously reviewed by me): Dependent atelectasis. No obvious pericardial effusion with limited views. Peripancreatic inflammation with trace free fluid identified. No evidence of pancreatic duct dilatation. Moderate colonic diverticulosis noted. 4 mm right lower pole nephrolithiasis & 5.5 cm left lower pole renal cyst. No intraperitoneal free air.  CARDIAC NUCLEAR STRESS TEST (09/28/14): Abnormal stress test with moderate sized mild in intensity reversible perfusion defect noted in lateral wall suggesting moderate ischemia. Mildly reduced LV systolic function with calculated EF 46%. No specific wall motion abnormalities seen. Mild global hypokinesis. 1 mm horizontal ST depression noted with exercise on EKG portion. No chest pain reported. Appropriate hemodynamic response to exercise.  LABS 12/15/14  BMP: 138/4.0/103/25/18/0.84/137/9.0 LFT: 4.0/7.2/0.5/83/16/16 CK: 94 CK-MB: 1.5 Troponin I: <0.01 BNP: 51 Amylase: 1281 Lipase: 1736 CBC: 13.8/14.7/44.5/258    Assessment & Plan:  64 year old male with mild restrictive lung  disease. Reports his dyspnea and chest discomfort did significantly improve during a three-week course of broad-spectrum antibiotics for treatment of his underlying MRSA cellulitis. An atypical infection is certainly possible. There is no suggestion of interstitial lung disease on his prior chest x-ray but this is certainly a possibility despite his normal carbon monoxide diffusion capacity. I reassured the patient and his wife that inhaler therapy would be of limited benefit at this time given absence of COPD on his spirometry. I instructed the patient and his wife to contact me if they have any new breathing problems before his next appointment.  1. Mild restrictive lung disease: Checking CT scan of the chest without contrast. May need cardiopulmonary exercise testing for evaluation of underlying dyspnea. 2. Other chest pain: Checking transthoracic echocardiogram. We require cardiopulmonary exercise testing depending upon this result. 3. Cough: Checking sputum culture for AFB, fungus, and bacteria. 4. Follow-up: Patient to return to clinic in 4-8 weeks or sooner if needed.

## 2015-11-21 LAB — RESPIRATORY CULTURE OR RESPIRATORY AND SPUTUM CULTURE
Culture: NORMAL
ORGANISM ID, BACTERIA: NORMAL

## 2015-11-25 ENCOUNTER — Ambulatory Visit (HOSPITAL_COMMUNITY)
Admission: RE | Admit: 2015-11-25 | Discharge: 2015-11-25 | Disposition: A | Discharge status: Home / Self Care | Payer: BLUE CROSS/BLUE SHIELD | Source: Ambulatory Visit | Attending: Pulmonary Disease | Admitting: Pulmonary Disease

## 2015-11-25 ENCOUNTER — Ambulatory Visit (HOSPITAL_BASED_OUTPATIENT_CLINIC_OR_DEPARTMENT_OTHER)
Admission: RE | Admit: 2015-11-25 | Discharge: 2015-11-25 | Disposition: A | Discharge status: Home / Self Care | Payer: BLUE CROSS/BLUE SHIELD | Source: Ambulatory Visit | Attending: Pulmonary Disease | Admitting: Pulmonary Disease

## 2015-11-25 DIAGNOSIS — I10 Essential (primary) hypertension: Secondary | ICD-10-CM | POA: Diagnosis not present

## 2015-11-25 DIAGNOSIS — R0789 Other chest pain: Secondary | ICD-10-CM | POA: Insufficient documentation

## 2015-11-25 DIAGNOSIS — J984 Other disorders of lung: Secondary | ICD-10-CM

## 2015-11-25 DIAGNOSIS — E785 Hyperlipidemia, unspecified: Secondary | ICD-10-CM | POA: Insufficient documentation

## 2015-11-25 DIAGNOSIS — E119 Type 2 diabetes mellitus without complications: Secondary | ICD-10-CM | POA: Diagnosis not present

## 2015-11-25 DIAGNOSIS — I2510 Atherosclerotic heart disease of native coronary artery without angina pectoris: Secondary | ICD-10-CM | POA: Diagnosis not present

## 2015-11-29 ENCOUNTER — Other Ambulatory Visit: Payer: Self-pay

## 2015-11-29 DIAGNOSIS — R0600 Dyspnea, unspecified: Secondary | ICD-10-CM

## 2015-12-13 LAB — FUNGUS CULTURE W SMEAR: SMEAR RESULT: NONE SEEN

## 2015-12-16 ENCOUNTER — Encounter (HOSPITAL_COMMUNITY): Payer: BLUE CROSS/BLUE SHIELD

## 2015-12-28 ENCOUNTER — Ambulatory Visit: Payer: BLUE CROSS/BLUE SHIELD | Admitting: Pulmonary Disease

## 2015-12-31 LAB — AFB CULTURE WITH SMEAR (NOT AT ARMC): ACID FAST SMEAR: NONE SEEN

## 2016-03-28 DIAGNOSIS — R404 Transient alteration of awareness: Secondary | ICD-10-CM | POA: Diagnosis not present

## 2016-03-28 DIAGNOSIS — R112 Nausea with vomiting, unspecified: Secondary | ICD-10-CM | POA: Diagnosis not present

## 2016-03-28 DIAGNOSIS — R197 Diarrhea, unspecified: Secondary | ICD-10-CM | POA: Diagnosis not present

## 2016-03-28 DIAGNOSIS — R531 Weakness: Secondary | ICD-10-CM | POA: Diagnosis not present

## 2016-05-21 ENCOUNTER — Encounter: Payer: Self-pay | Admitting: Internal Medicine

## 2016-06-19 ENCOUNTER — Ambulatory Visit: Payer: BLUE CROSS/BLUE SHIELD | Admitting: Nurse Practitioner

## 2016-06-29 ENCOUNTER — Encounter: Payer: Self-pay | Admitting: Nurse Practitioner

## 2016-06-29 ENCOUNTER — Ambulatory Visit (INDEPENDENT_AMBULATORY_CARE_PROVIDER_SITE_OTHER): Payer: BLUE CROSS/BLUE SHIELD | Admitting: Nurse Practitioner

## 2016-06-29 ENCOUNTER — Other Ambulatory Visit: Payer: Self-pay

## 2016-06-29 VITALS — BP 122/65 | HR 85 | Temp 97.9°F | Ht 68.5 in | Wt 215.6 lb

## 2016-06-29 DIAGNOSIS — R1013 Epigastric pain: Secondary | ICD-10-CM | POA: Diagnosis not present

## 2016-06-29 DIAGNOSIS — R0600 Dyspnea, unspecified: Secondary | ICD-10-CM | POA: Diagnosis not present

## 2016-06-29 NOTE — Progress Notes (Signed)
Primary Care Physician:  Monico Blitz, MD Primary Gastroenterologist:  Dr. Gala Romney  Chief Complaint  Patient presents with  . Gastroesophageal Reflux    HPI:   Albert Peterson is a 65 y.o. male who presents on referral from primary care for GERD. PCP notes reviewed, is on Protonix once daily. No colonoscopy or endoscopy notes in our system. Has received been referred to cardiology and pulmonology due to dyspnea on exertion and chest pain. Pulmonology and cardiology workup completed. Pulmonology notes dyspnea suspected underlying COPD, possible angina on exertion defer to cardiology. After pulmonary function testing noted does have mild restrictive lung volumes.  Recently seen in the emergency department at [facility] on 05/11/2016 for abdominal pain, vomiting. Vitals are stable, labs drawn show borderline normal white blood cell count at 10.2, normal hemoglobin and hematocrit of 14.9/44.9, normal kidney function with a creatinine of 1.07. Normal LFTs.  Today he states he's been having some epigastric pain as well as shortness of breath after eating. Symptoms have been ongoing for approximately 2 years. No N/V. Minimal to no epigastric discomfort. Denies esophageal burning, bitter taste. Has been taking Protonix which has helped. Denies hematochezia, melena, fever, chills. Has lost some weight intentionally. Has been seen by cardiology with stress test and cardiac cath with no stenting required. Pulmonary said mild COPD, but not a primary cause of his symptoms. Last colonoscopy "well over 5 years ago" at [facility] and was not told when to return. Has never had EGD. Before starting Protonix he would have to sit upright in bed to catch his breath; tends to eat late due to work. Denies dysphagia symptoms. Denies chest pain, dyspnea, dizziness, lightheadedness, syncope, near syncope. Denies any other upper or lower GI symptoms.  Past Medical History  Diagnosis Date  . Benign essential  hypertension   . Hypercholesteremia   . DM2 (diabetes mellitus, type 2) ([place])   . Diverticulitis   . Eczema   . Hematuria   . Hyperlipidemia   . SOB (shortness of breath)   . Nephrolithiasis     Per patient "kidney cysts" not stones  . Psoriasis     right leg    Past Surgical History  Procedure Laterality Date  . Neck surgery    . Cataract extraction w/phaco Right 12/17/2013    Procedure: CATARACT EXTRACTION PHACO AND INTRAOCULAR LENS PLACEMENT (IOC) CDE=13.21;  Surgeon: Tonny Branch, MD;  Location: [facility];  Service: Ophthalmology;  Laterality: Right;  . Trigger finger release    . Left heart cath    . Colonoscopy      At [facility] 2012 or earlier; records requested    Current Outpatient Prescriptions  Medication Sig Dispense Refill  . aspirin 81 MG tablet Take 81 mg by mouth daily.    . hydrALAZINE (APRESOLINE) 50 MG tablet Take 25 mg by mouth 2 (two) times daily.   2  . pantoprazole (PROTONIX) 40 MG tablet Take 40 mg by mouth daily.  2  . Potassium 99 MG TABS Take 99 mg by mouth daily.    . ranitidine (ZANTAC) 150 MG tablet Take 150 mg by mouth 2 (two) times daily.     No current facility-administered medications for this visit.    Allergies as of 06/29/2016  . (No Known Allergies)    Family History  Problem Relation Age of Onset  . Colon cancer Father     Unknown age of diagnosis  . Cancer Mother     ? type  . Breast  cancer Maternal Aunt   . Lung disease Neg Hx     Social History   Social History  . Marital Status: Married    Spouse Name: N/A  . Number of Children: 1  . Years of Education: N/A   Occupational History  . shipping and receiving     Social History Main Topics  . Smoking status: Former Smoker -- 1.50 packs/day for 15 years    Types: Cigarettes    Start date: 12/11/1963    Quit date: 12/10/1993  . Smokeless tobacco: Never Used     Comment: Quit smoking for 13 years at one point  . Alcohol Use: No     Comment: None currently; previous  "youth experience" as a teenager  . Drug Use: No  . Sexual Activity: Not on file   Other Topics Concern  . Not on file   Social History Narrative   Originally from [place]. He moved to [place] in 1973. He has only lived here and [place]. He has traveled to [place], [place], [place], [place], [place], and states along the way to [place]. No international travel. 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 No history of exposure to exotic lumber. Has a dog at home. No bird, mold, or hot tub exposure. 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 No known asbestos exposure.     Review of Systems: General: Negative for anorexia, weight loss, fever, chills, fatigue, weakness. ENT: Negative for hoarseness, difficulty swallowing. CV: Negative for chest pain, angina, palpitations, peripheral edema.  Respiratory: Negative for dyspnea at rest, cough, sputum, wheezing.  GI: See history of present illness. MS: Negative for excessive joint/back pain.  Derm: Negative for rash or itching.  Neuro: Negative for memory loss, confusion.  Endo: Negative for unusual weight change.  Heme: Negative for bruising or bleeding.    Physical Exam: BP 122/65 mmHg  Pulse 85  Temp(Src) 97.9 F (36.6 C) (Oral)  Ht 5' 8.5" (1.74 m)  Wt 215 lb 9.6 oz (97.796 kg)  BMI 32.30 kg/m2 General:   Alert and oriented. Pleasant and cooperative. Well-nourished and well-developed.  Head:  Normocephalic and atraumatic. Eyes:  Without icterus, sclera clear and conjunctiva pink.  Ears:  Normal auditory acuity. Cardiovascular:  S1, S2 present without murmurs appreciated. Extremities without clubbing or edema. Respiratory:  Clear to auscultation bilaterally. No wheezes, rales, or rhonchi. No distress.  Gastrointestinal:  +BS, rounded but soft, non-tender and non-distended. No HSM noted. No guarding or rebound. No masses appreciated.  Rectal:  Deferred  Musculoskalatal:  Symmetrical without gross  deformities. Neurologic:  Alert and oriented x4;  grossly normal neurologically. Psych:  Alert and cooperative. Normal mood and affect. Heme/Lymph/Immune: No excessive bruising noted.    06/29/2016 11:16 AM   Disclaimer: This note was dictated with voice recognition software. Similar sounding words can inadvertently be transcribed and may not be corrected upon review.

## 2016-06-29 NOTE — Patient Instructions (Signed)
1. Continue taking Protonix and ranitidine as we discussed. 2. We will request records from [facility] including your emergency room visit, CAT scan imaging, previous colonoscopy. 3. We will schedule your procedure for you. 4. Return for follow-up in 3 months or based on recommendations made after your procedure.

## 2016-06-29 NOTE — Assessment & Plan Note (Signed)
Patient with dyspepsia symptoms including worsening shortness of breath after eating. Denies classic symptoms such as epigastric pain, esophageal burning, nausea, vomiting. He has had an extensive workup with pulmonary and cardiology including cardiac catheterization, stress testing, pulmonary function tests. No etiology for his symptoms discovered as of yet. Protonix has seemed to help his symptoms. At this point given persistent symptoms which improved initially on PPI we will proceed with an upper endoscopy to further evaluate. Differentials include esophagitis, silent GERD, hiatal hernia, peptic ulcer disease, gastritis, duodenitis. No dysphagia.  We'll request previous colonoscopy records as well as radius emergency room notes from [facility] to include CT imaging of the abdomen. Continue PPI and H2 receptor blocker. Return for follow-up in 3 months, or based on postprocedure recommendations.  Proceed with EGD with Dr. Gala Romney in near future: the risks, benefits, and alternatives have been discussed with the patient in detail. The patient states understanding and desires to proceed.  The patient is not on any anticoagulants, anxiolytics, chronic pain medications, or antidepressants. Rare to no drinking, no drugs. Conscious sedation should be adequate for his procedure.

## 2016-06-29 NOTE — Progress Notes (Signed)
CC'D TO PCP °

## 2016-06-29 NOTE — Assessment & Plan Note (Signed)
Worsening dyspnea after eating, extensive pulmonary and cardiac workup. Possible upper GI etiology. Plan per below.

## 2016-07-24 ENCOUNTER — Encounter (HOSPITAL_COMMUNITY): Payer: Self-pay | Admitting: *Deleted

## 2016-07-26 ENCOUNTER — Encounter (HOSPITAL_COMMUNITY): Payer: Self-pay | Admitting: *Deleted

## 2016-07-26 ENCOUNTER — Ambulatory Visit (HOSPITAL_COMMUNITY)
Admission: RE | Admit: 2016-07-26 | Discharge: 2016-07-26 | Disposition: A | Discharge status: Home / Self Care | Payer: BLUE CROSS/BLUE SHIELD | Source: Ambulatory Visit | Attending: Internal Medicine | Admitting: Internal Medicine

## 2016-07-26 ENCOUNTER — Encounter (HOSPITAL_COMMUNITY)
Admission: RE | Disposition: A | Discharge status: Home / Self Care | Payer: Self-pay | Source: Ambulatory Visit | Attending: Internal Medicine

## 2016-07-26 DIAGNOSIS — E119 Type 2 diabetes mellitus without complications: Secondary | ICD-10-CM | POA: Diagnosis not present

## 2016-07-26 DIAGNOSIS — K219 Gastro-esophageal reflux disease without esophagitis: Secondary | ICD-10-CM | POA: Diagnosis not present

## 2016-07-26 DIAGNOSIS — K3189 Other diseases of stomach and duodenum: Secondary | ICD-10-CM | POA: Diagnosis not present

## 2016-07-26 DIAGNOSIS — Z79899 Other long term (current) drug therapy: Secondary | ICD-10-CM | POA: Insufficient documentation

## 2016-07-26 DIAGNOSIS — Z7982 Long term (current) use of aspirin: Secondary | ICD-10-CM | POA: Diagnosis not present

## 2016-07-26 DIAGNOSIS — K30 Functional dyspepsia: Secondary | ICD-10-CM

## 2016-07-26 DIAGNOSIS — R1013 Epigastric pain: Secondary | ICD-10-CM | POA: Insufficient documentation

## 2016-07-26 DIAGNOSIS — K2950 Unspecified chronic gastritis without bleeding: Secondary | ICD-10-CM | POA: Diagnosis not present

## 2016-07-26 DIAGNOSIS — Z87891 Personal history of nicotine dependence: Secondary | ICD-10-CM | POA: Diagnosis not present

## 2016-07-26 DIAGNOSIS — I10 Essential (primary) hypertension: Secondary | ICD-10-CM | POA: Insufficient documentation

## 2016-07-26 HISTORY — PX: BIOPSY: SHX5522

## 2016-07-26 HISTORY — PX: ESOPHAGOGASTRODUODENOSCOPY: SHX5428

## 2016-07-26 SURGERY — EGD (ESOPHAGOGASTRODUODENOSCOPY)
Anesthesia: Moderate Sedation

## 2016-07-26 MED ORDER — MEPERIDINE HCL 100 MG/ML IJ SOLN
INTRAMUSCULAR | Status: AC
  Filled 2016-07-26: qty 2

## 2016-07-26 MED ORDER — LIDOCAINE VISCOUS 2 % MT SOLN
OROMUCOSAL | Status: AC
  Filled 2016-07-26: qty 15

## 2016-07-26 MED ORDER — STERILE WATER FOR IRRIGATION IR SOLN
Status: DC | PRN
  Administered 2016-07-26: 08:00:00

## 2016-07-26 MED ORDER — MIDAZOLAM HCL 5 MG/5ML IJ SOLN
INTRAMUSCULAR | Status: AC
  Filled 2016-07-26: qty 10

## 2016-07-26 MED ORDER — MIDAZOLAM HCL 5 MG/5ML IJ SOLN
INTRAMUSCULAR | Status: DC | PRN
  Administered 2016-07-26 (×2): 1 mg via INTRAVENOUS
  Administered 2016-07-26: 2 mg via INTRAVENOUS

## 2016-07-26 MED ORDER — ONDANSETRON HCL 4 MG/2ML IJ SOLN
INTRAMUSCULAR | Status: DC | PRN
  Administered 2016-07-26: 4 mg via INTRAVENOUS

## 2016-07-26 MED ORDER — MEPERIDINE HCL 100 MG/ML IJ SOLN
INTRAMUSCULAR | Status: DC | PRN
Start: 2016-07-26 — End: 2016-07-26
  Administered 2016-07-26: 25 mg via INTRAVENOUS
  Administered 2016-07-26: 50 mg via INTRAVENOUS

## 2016-07-26 MED ORDER — SODIUM CHLORIDE 0.9 % IV SOLN
INTRAVENOUS | Status: DC
  Administered 2016-07-26: 1000 mL via INTRAVENOUS

## 2016-07-26 MED ORDER — LIDOCAINE VISCOUS 2 % MT SOLN
OROMUCOSAL | Status: DC | PRN
  Administered 2016-07-26: 3 mL via OROMUCOSAL

## 2016-07-26 MED ORDER — ONDANSETRON HCL 4 MG/2ML IJ SOLN
INTRAMUSCULAR | Status: AC
  Filled 2016-07-26: qty 2

## 2016-07-26 NOTE — H&P (View-Only) (Signed)
Primary Care Physician:  Monico Blitz, MD Primary Gastroenterologist:  Dr. Gala Romney  Chief Complaint  Patient presents with  . Gastroesophageal Reflux    HPI:   Albert Peterson is a 65 y.o. male who presents on referral from primary care for GERD. PCP notes reviewed, is on Protonix once daily. No colonoscopy or endoscopy notes in our system. Has received been referred to cardiology and pulmonology due to dyspnea on exertion and chest pain. Pulmonology and cardiology workup completed. Pulmonology notes dyspnea suspected underlying COPD, possible angina on exertion defer to cardiology. After pulmonary function testing noted does have mild restrictive lung volumes.  Recently seen in the emergency department at [facility] on 05/11/2016 for abdominal pain, vomiting. Vitals are stable, labs drawn show borderline normal white blood cell count at 10.2, normal hemoglobin and hematocrit of 14.9/44.9, normal kidney function with a creatinine of 1.07. Normal LFTs.  Today he states he's been having some epigastric pain as well as shortness of breath after eating. Symptoms have been ongoing for approximately 2 years. No N/V. Minimal to no epigastric discomfort. Denies esophageal burning, bitter taste. Has been taking Protonix which has helped. Denies hematochezia, melena, fever, chills. Has lost some weight intentionally. Has been seen by cardiology with stress test and cardiac cath with no stenting required. Pulmonary said mild COPD, but not a primary cause of his symptoms. Last colonoscopy "well over 5 years ago" at [facility] and was not told when to return. Has never had EGD. Before starting Protonix he would have to sit upright in bed to catch his breath; tends to eat late due to work. Denies dysphagia symptoms. Denies chest pain, dyspnea, dizziness, lightheadedness, syncope, near syncope. Denies any other upper or lower GI symptoms.  Past Medical History  Diagnosis Date  . Benign essential  hypertension   . Hypercholesteremia   . DM2 (diabetes mellitus, type 2) ([place])   . Diverticulitis   . Eczema   . Hematuria   . Hyperlipidemia   . SOB (shortness of breath)   . Nephrolithiasis     Per patient "kidney cysts" not stones  . Psoriasis     right leg    Past Surgical History  Procedure Laterality Date  . Neck surgery    . Cataract extraction w/phaco Right 12/17/2013    Procedure: CATARACT EXTRACTION PHACO AND INTRAOCULAR LENS PLACEMENT (IOC) CDE=13.21;  Surgeon: Tonny Branch, MD;  Location: [facility];  Service: Ophthalmology;  Laterality: Right;  . Trigger finger release    . Left heart cath    . Colonoscopy      At [facility] 2012 or earlier; records requested    Current Outpatient Prescriptions  Medication Sig Dispense Refill  . aspirin 81 MG tablet Take 81 mg by mouth daily.    . hydrALAZINE (APRESOLINE) 50 MG tablet Take 25 mg by mouth 2 (two) times daily.   2  . pantoprazole (PROTONIX) 40 MG tablet Take 40 mg by mouth daily.  2  . Potassium 99 MG TABS Take 99 mg by mouth daily.    . ranitidine (ZANTAC) 150 MG tablet Take 150 mg by mouth 2 (two) times daily.     No current facility-administered medications for this visit.    Allergies as of 06/29/2016  . (No Known Allergies)    Family History  Problem Relation Age of Onset  . Colon cancer Father     Unknown age of diagnosis  . Cancer Mother     ? type  . Breast  cancer Maternal Aunt   . Lung disease Neg Hx     Social History   Social History  . Marital Status: Married    Spouse Name: N/A  . Number of Children: 1  . Years of Education: N/A   Occupational History  . shipping and receiving     Social History Main Topics  . Smoking status: Former Smoker -- 1.50 packs/day for 15 years    Types: Cigarettes    Start date: 12/11/1963    Quit date: 12/10/1993  . Smokeless tobacco: Never Used     Comment: Quit smoking for 13 years at one point  . Alcohol Use: No     Comment: None currently; previous  "youth experience" as a teenager  . Drug Use: No  . Sexual Activity: Not on file   Other Topics Concern  . Not on file   Social History Narrative   Originally from [place]. He moved to [place] in 1973. He has only lived here and [place]. He has traveled to [place], [place], [place], [place], [place], and states along the way to [place]. No international travel. 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 No history of exposure to exotic lumber. Has a dog at home. No bird, mold, or hot tub exposure. 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 No known asbestos exposure.     Review of Systems: General: Negative for anorexia, weight loss, fever, chills, fatigue, weakness. ENT: Negative for hoarseness, difficulty swallowing. CV: Negative for chest pain, angina, palpitations, peripheral edema.  Respiratory: Negative for dyspnea at rest, cough, sputum, wheezing.  GI: See history of present illness. MS: Negative for excessive joint/back pain.  Derm: Negative for rash or itching.  Neuro: Negative for memory loss, confusion.  Endo: Negative for unusual weight change.  Heme: Negative for bruising or bleeding.    Physical Exam: BP 122/65 mmHg  Pulse 85  Temp(Src) 97.9 F (36.6 C) (Oral)  Ht 5' 8.5" (1.74 m)  Wt 215 lb 9.6 oz (97.796 kg)  BMI 32.30 kg/m2 General:   Alert and oriented. Pleasant and cooperative. Well-nourished and well-developed.  Head:  Normocephalic and atraumatic. Eyes:  Without icterus, sclera clear and conjunctiva pink.  Ears:  Normal auditory acuity. Cardiovascular:  S1, S2 present without murmurs appreciated. Extremities without clubbing or edema. Respiratory:  Clear to auscultation bilaterally. No wheezes, rales, or rhonchi. No distress.  Gastrointestinal:  +BS, rounded but soft, non-tender and non-distended. No HSM noted. No guarding or rebound. No masses appreciated.  Rectal:  Deferred  Musculoskalatal:  Symmetrical without gross  deformities. Neurologic:  Alert and oriented x4;  grossly normal neurologically. Psych:  Alert and cooperative. Normal mood and affect. Heme/Lymph/Immune: No excessive bruising noted.    06/29/2016 11:16 AM   Disclaimer: This note was dictated with voice recognition software. Similar sounding words can inadvertently be transcribed and may not be corrected upon review.

## 2016-07-26 NOTE — Interval H&P Note (Signed)
History and Physical Interval Note:  07/26/2016 7:35 AM  Albert Peterson  has presented today for surgery, with the diagnosis of dyspepsia  The various methods of treatment have been discussed with the patient and family. After consideration of risks, benefits and other options for treatment, the patient has consented to  Procedure(s) with comments: ESOPHAGOGASTRODUODENOSCOPY (EGD) (N/A) - 730  as a surgical intervention .  The patient's history has been reviewed, patient examined, no change in status, stable for surgery.  I have reviewed the patient's chart and labs.  Questions were answered to the patient's satisfaction.     Albert Peterson  No change. Patient notes twice a day PPI better than once daily for control of symptoms.  The risks, benefits, limitations, alternatives and imponderables have been reviewed with the patient. Potential for esophageal dilation, biopsy, etc. have also been reviewed.  Questions have been answered. All parties agreeable.

## 2016-07-26 NOTE — Op Note (Signed)
[facility] Patient Name: Albert Peterson Procedure Date: 07/26/2016 7:29 AM MRN: [id number] Date of Birth: [birth year] Attending MD: Norvel Richards , MD CSN: [id number] Age: 65 Admit Type: Outpatient Procedure:                Upper GI endoscopy with biopsy Indications:              Dyspepsia Providers:                Norvel Richards, MD, Jeanann Lewandowsky. Sharon Seller, RN,                            Purcell Nails. Tina Griffiths, Technician Referring MD:              Medicines:                Midazolam 4 mg IV, Meperidine 75 mg IV, Ondansetron                            4 mg IV Complications:            No immediate complications. Estimated Blood Loss:     Estimated blood loss was minimal. Procedure:                Pre-Anesthesia Assessment:                           - Prior to the procedure, a History and Physical                            was performed, and patient medications and                            allergies were reviewed. The patient's tolerance of                            previous anesthesia was also reviewed. The risks                            and benefits of the procedure and the sedation                            options and risks were discussed with the patient.                            All questions were answered, and informed consent                            was obtained. Prior Anticoagulants: The patient has                            taken no previous anticoagulant or antiplatelet                            agents. ASA Grade Assessment: II - A patient with  mild systemic disease. After reviewing the risks                            and benefits, the patient was deemed in                            satisfactory condition to undergo the procedure.                           After obtaining informed consent, the endoscope was                            passed under direct vision. Throughout the                            procedure, the  patient's blood pressure, pulse, and                            oxygen saturations were monitored continuously. The                            [id number] scope was introduced through the                            mouth, and advanced to the second part of duodenum.                            The upper GI endoscopy was accomplished without                            difficulty. The patient tolerated the procedure                            well. Scope In: [id number] AM Scope Out: 7:51:53 AM Total Procedure Duration: 0 hours 5 minutes 14 seconds  Findings:      The examined esophagus was normal.      A few localized 3 mm erosions were found in the gastric antrum. No ulcer       or infiltrative process observed. Biopsies were taken with a cold       forceps for histology. Estimated blood loss was minimal.      The duodenal bulb and second portion of the duodenum were normal. Impression:               - Normal esophagus.                           - Erosive gastropathy. Biopsied.                           - Normal duodenal bulb and second portion of the                            duodenum. Moderate Sedation:      Moderate (conscious) sedation was administered by the endoscopy nurse  and supervised by the endoscopist. The following parameters were       monitored: oxygen saturation, heart rate, blood pressure, respiratory       rate, EKG, adequacy of pulmonary ventilation, and response to care.       Total physician intraservice time was 15 minutes. Recommendation:           - Patient has a contact number available for                            emergencies. The signs and symptoms of potential                            delayed complications were discussed with the                            patient. Return to normal activities tomorrow.                            Written discharge instructions were provided to the                            patient.                           -  Advance diet as tolerated.                           - Stop Protonix for now; three-week trial of                            Dexilant (dexlansoprazole) 60 mg PO daily today.                           - Return to GI office after studies are complete.                            Further evaluation may be needed.                           - Continue present medications. Procedure Code(s):        --- Professional ---                           [phone], Esophagogastroduodenoscopy, flexible,                            transoral; with biopsy, single or multiple                           99152, Moderate sedation services provided by the                            same physician or other qualified health care                            professional performing  the diagnostic or                            therapeutic service that the sedation supports,                            requiring the presence of an independent trained                            observer to assist in the monitoring of the                            patient's level of consciousness and physiological                            status; initial 15 minutes of intraservice time,                            patient age 58 years or older Diagnosis Code(s):        --- Professional ---                           K31.89, Other diseases of stomach and duodenum                           K30, Functional dyspepsia CPT copyright 2016 American Medical Association. All rights reserved. The codes documented in this report are preliminary and upon coder review may  be revised to meet current compliance requirements. Cristopher Estimable. Cuca Benassi, MD Norvel Richards, MD 07/26/2016 8:12:50 AM This report has been signed electronically. Number of Addenda: 0

## 2016-07-26 NOTE — Discharge Instructions (Signed)
EGD Discharge instructions Please read the instructions outlined below and refer to this sheet in the next few weeks. These discharge instructions provide you with general information on caring for yourself after you leave the hospital. Your doctor may also give you specific instructions. While your treatment has been planned according to the most current medical practices available, unavoidable complications occasionally occur. If you have any problems or questions after discharge, please call your doctor. ACTIVITY  You may resume your regular activity but move at a slower pace for the next 24 hours.   Take frequent rest periods for the next 24 hours.   Walking will help expel (get rid of) the air and reduce the bloated feeling in your abdomen.   No driving for 24 hours (because of the anesthesia (medicine) used during the test).   You may shower.   Do not sign any important legal documents or operate any machinery for 24 hours (because of the anesthesia used during the test).  NUTRITION  Drink plenty of fluids.   You may resume your normal diet.   Begin with a light meal and progress to your normal diet.   Avoid alcoholic beverages for 24 hours or as instructed by your caregiver.  MEDICATIONS  You may resume your normal medications unless your caregiver tells you otherwise.  WHAT YOU CAN EXPECT TODAY  You may experience abdominal discomfort such as a feeling of fullness or gas pains.  FOLLOW-UP  Your doctor will discuss the results of your test with you.  SEEK IMMEDIATE MEDICAL ATTENTION IF ANY OF THE FOLLOWING OCCUR:  Excessive nausea (feeling sick to your stomach) and/or vomiting.   Severe abdominal pain and distention (swelling).   Trouble swallowing.   Temperature over 101 F (37.8 C).   Rectal bleeding or vomiting of blood.    Stop Protonix; begin a three-week trial of Dexilant 60 mg once daily. Go by my office for supply of free samples  Further  recommendations to follow pending review of pathology report

## 2016-07-27 ENCOUNTER — Encounter: Payer: Self-pay | Admitting: Internal Medicine

## 2016-07-31 ENCOUNTER — Telehealth: Payer: Self-pay | Admitting: Internal Medicine

## 2016-07-31 ENCOUNTER — Encounter (HOSPITAL_COMMUNITY): Payer: Self-pay | Admitting: Internal Medicine

## 2016-07-31 NOTE — Telephone Encounter (Signed)
PT WIFE STATED THAT HE WAS WORSE SINCE HE STOPPED HIS PROTONIX AND BEGAN WITH THE SAMPLES HE GOT HERE.  HE IS GOING TO TAKE PROTONIX AGAIN.  [phone]   ALSO WANTING TO HEAR  BIOPSY RESULTS IF THEY ARE READY

## 2016-08-02 NOTE — Telephone Encounter (Signed)
Communication noted; he may stay on Protonix

## 2016-08-02 NOTE — Telephone Encounter (Signed)
Spoke with [name], pts wife. Pt has not gotten letter yet. Went over letter with her. Pt said the protonix works better for him and he would prefer to stay on it.

## 2016-10-02 ENCOUNTER — Ambulatory Visit: Payer: BLUE CROSS/BLUE SHIELD | Admitting: Nurse Practitioner

## 2016-10-12 ENCOUNTER — Ambulatory Visit: Payer: BLUE CROSS/BLUE SHIELD | Admitting: Nurse Practitioner

## 2017-01-21 DIAGNOSIS — E784 Other hyperlipidemia: Secondary | ICD-10-CM | POA: Diagnosis not present

## 2017-01-21 DIAGNOSIS — I10 Essential (primary) hypertension: Secondary | ICD-10-CM | POA: Diagnosis not present

## 2017-01-21 DIAGNOSIS — K5732 Diverticulitis of large intestine without perforation or abscess without bleeding: Secondary | ICD-10-CM | POA: Diagnosis not present

## 2017-01-21 DIAGNOSIS — J4521 Mild intermittent asthma with (acute) exacerbation: Secondary | ICD-10-CM | POA: Diagnosis not present

## 2017-01-23 DIAGNOSIS — R0600 Dyspnea, unspecified: Secondary | ICD-10-CM | POA: Diagnosis not present

## 2017-01-23 DIAGNOSIS — R5383 Other fatigue: Secondary | ICD-10-CM | POA: Diagnosis not present

## 2017-05-10 DIAGNOSIS — M25512 Pain in left shoulder: Secondary | ICD-10-CM | POA: Diagnosis not present

## 2017-05-10 DIAGNOSIS — I10 Essential (primary) hypertension: Secondary | ICD-10-CM | POA: Diagnosis not present

## 2017-05-10 DIAGNOSIS — J4521 Mild intermittent asthma with (acute) exacerbation: Secondary | ICD-10-CM | POA: Diagnosis not present

## 2017-05-10 DIAGNOSIS — E784 Other hyperlipidemia: Secondary | ICD-10-CM | POA: Diagnosis not present

## 2017-05-10 DIAGNOSIS — K5732 Diverticulitis of large intestine without perforation or abscess without bleeding: Secondary | ICD-10-CM | POA: Diagnosis not present

## 2017-05-10 DIAGNOSIS — Z125 Encounter for screening for malignant neoplasm of prostate: Secondary | ICD-10-CM | POA: Diagnosis not present

## 2017-08-13 IMAGING — CT CT CHEST W/O CM
2 of 3 series · 15 of 36 positions shown, 18 images · non-contrast
Comparison: Chest radiograph July 26, 2015

CLINICAL DATA: Shortness of Breath, intermittent for 1 year.
History of restrictive type lung disease. Hypertension. Former
tobacco use.

EXAM:
CT CHEST WITHOUT CONTRAST
TECHNIQUE: Multidetector CT imaging of the chest was performed following the
standard protocol without IV contrast.

[Series 2: chestroutine 5.0 b40f · axial · 0.75mm/px · z∈[-295,-60]mm · 12 of 57 slices shown, 15 images]
[im 5/57  mediastinal]
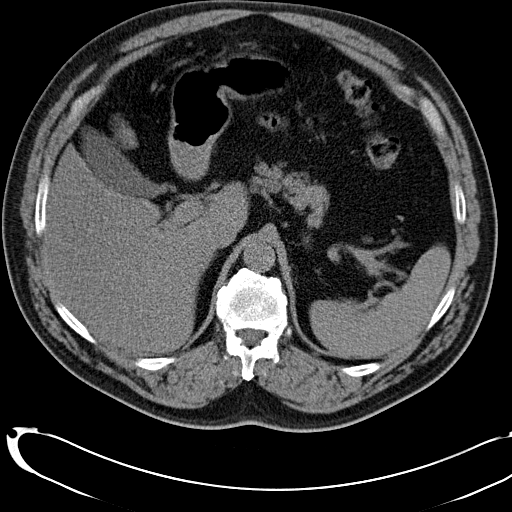
[im 5/57  lung]
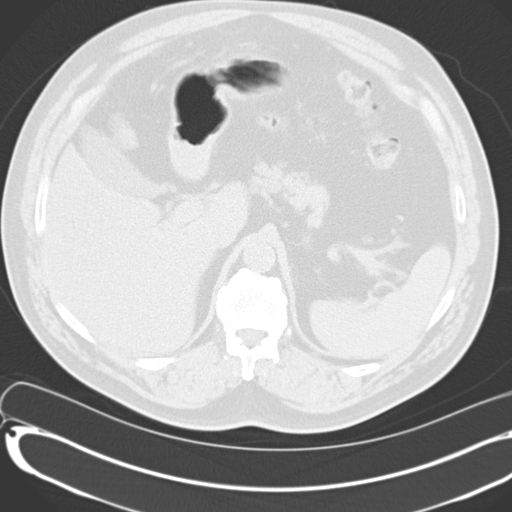
[im 9/57  lung]
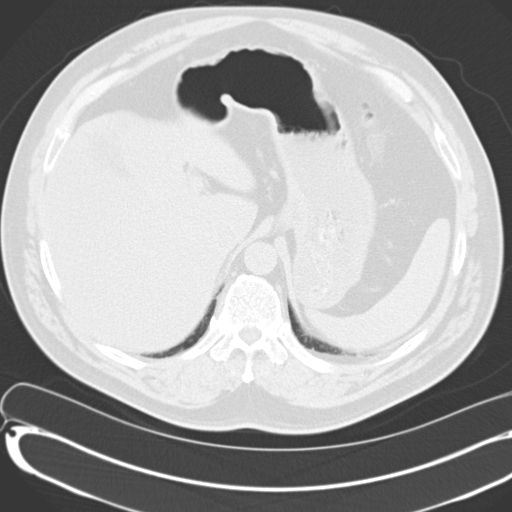
[im 13/57  lung]
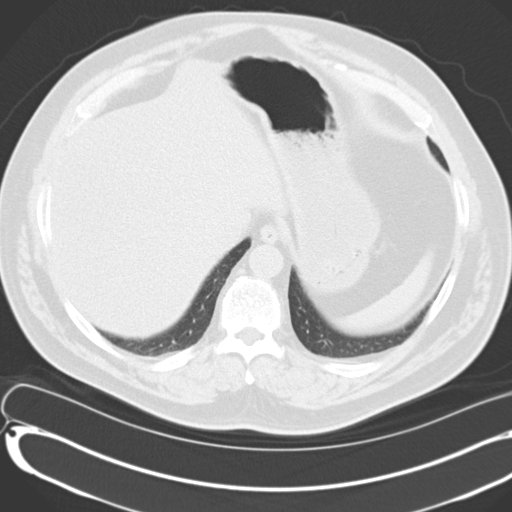
[im 17/57  lung]
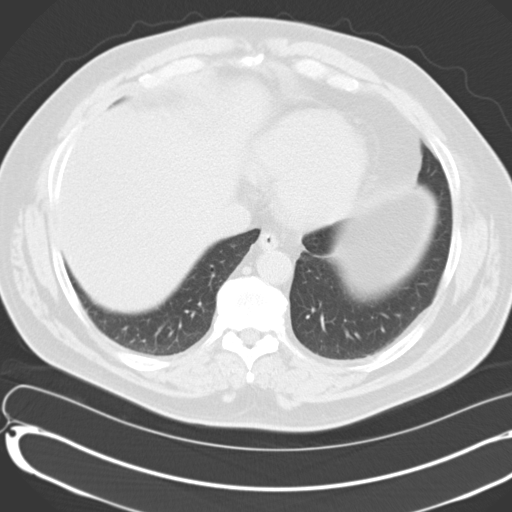
[im 21/57  mediastinal]
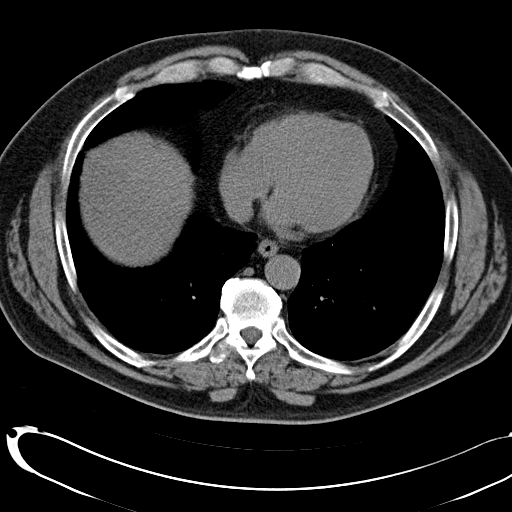
[im 21/57  lung]
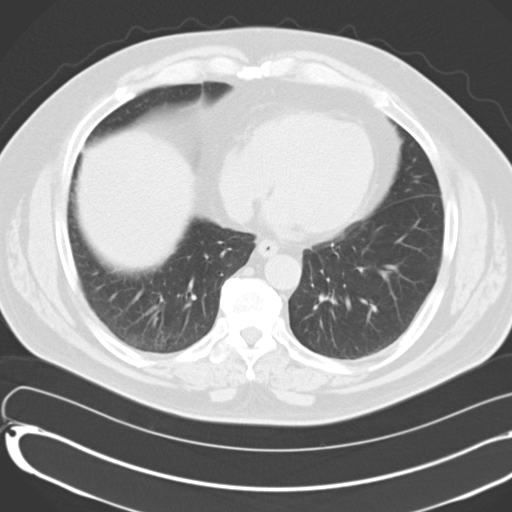
[im 25/57  lung]
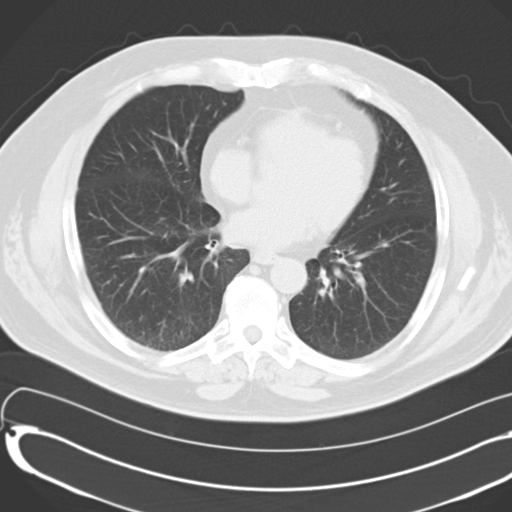
[im 32/57  lung]
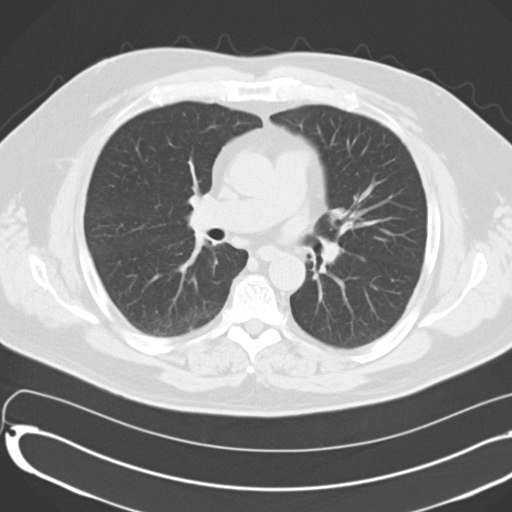
[im 36/57  lung]
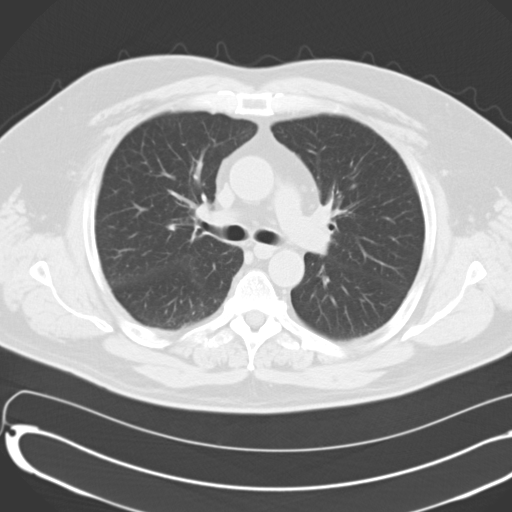
[im 40/57  mediastinal]
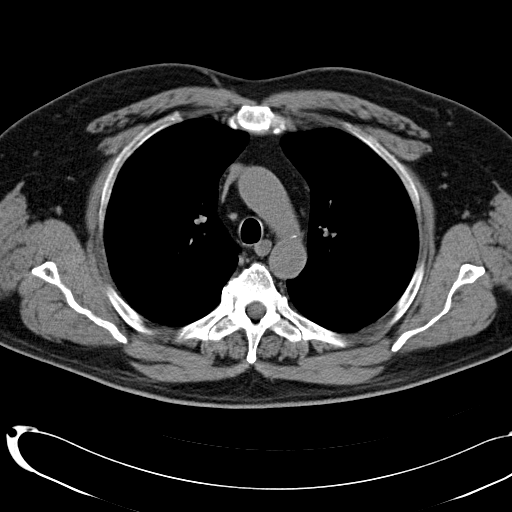
[im 40/57  lung]
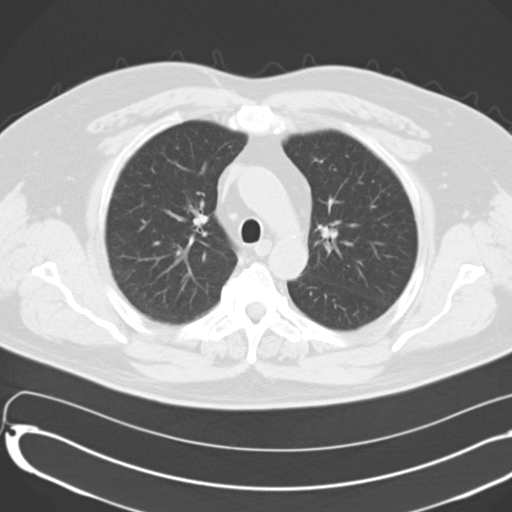
[im 44/57  lung]
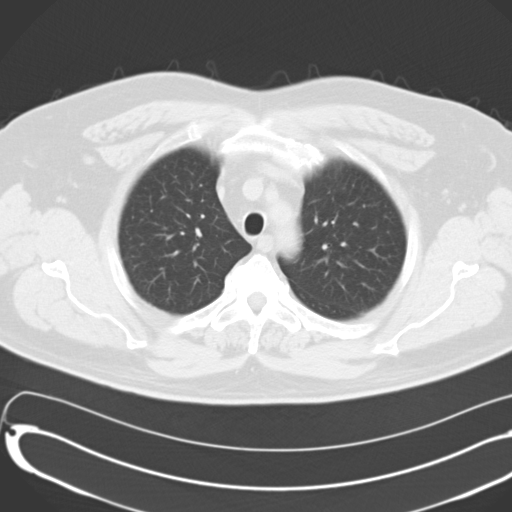
[im 48/57  lung]
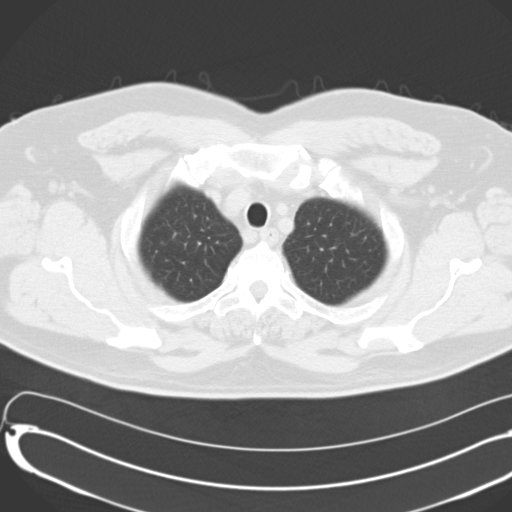
[im 52/57  lung]
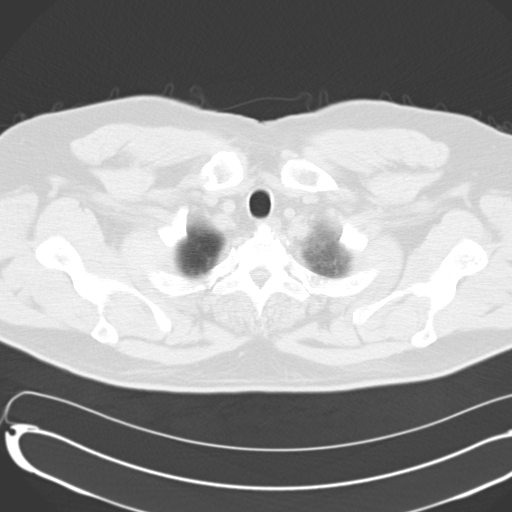

[Series 4: mpr coro 3mm · coronal · 0.57mm/px · 3 of 87 slices shown]
[im 18/87  lung]
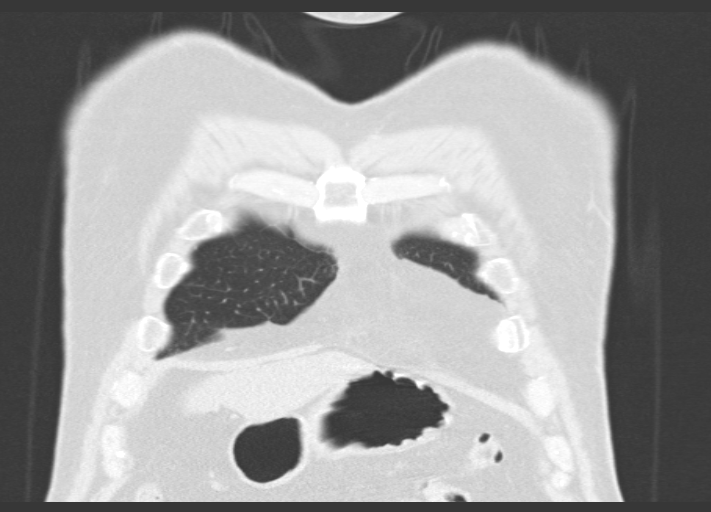
[im 35/87  lung]
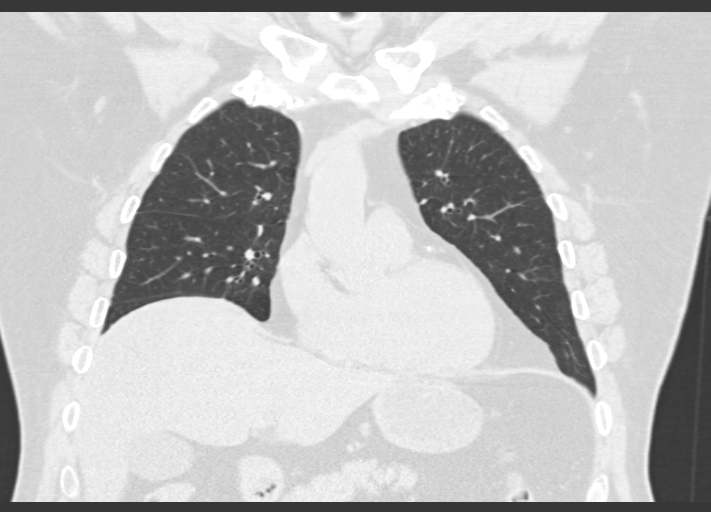
[im 52/87  lung]
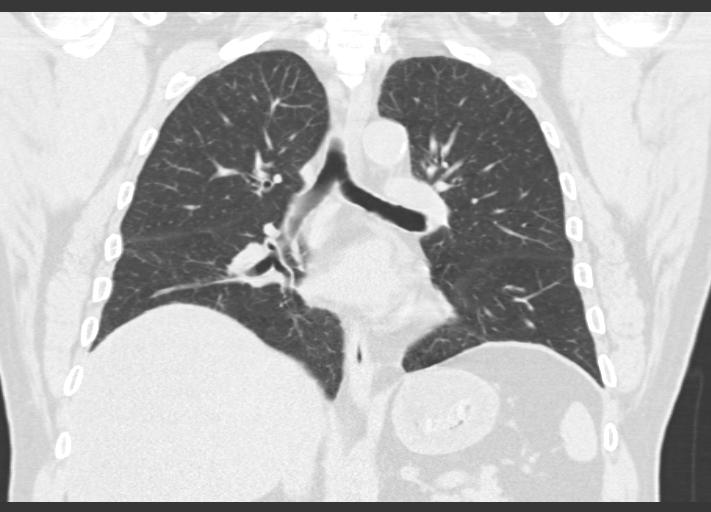

[15 of 36 positions shown; findings below may reference images not displayed]

FINDINGS: Mediastinum/Lymph Nodes: Visualized thyroid appears normal. There is
no appreciable thoracic adenopathy. There is no thoracic aortic
aneurysm. Visualized great vessels appear unremarkable on this
noncontrast enhanced study. There are scattered foci of coronary
artery calcification. The pericardium is not thickened. There is
moderate mediastinal fat.

Lungs/Pleura: There is no edema or consolidation. There is no
appreciable lobular septal thickening or honeycombing type
appearance to suggest interstitial fibrosis. No appreciable
ground-glass type appearance is evident on this study. There is no
abnormal pleural thickening or effusion.

Upper abdomen: Visualized upper abdominal structures are
unremarkable.

Musculoskeletal: There is postoperative change in the lower cervical
spine. No blastic or lytic bone lesions.
IMPRESSION: Lungs are clear. There is no evidence suggesting interstitial
fibrosis or lobular septal thickening of the interstitium. No
airspace opacity or ground-glass type opacity. No pleural effusions.

No adenopathy.

Scattered foci of coronary calcification are present.

## 2017-09-09 DIAGNOSIS — J4521 Mild intermittent asthma with (acute) exacerbation: Secondary | ICD-10-CM | POA: Diagnosis not present

## 2017-09-09 DIAGNOSIS — R7303 Prediabetes: Secondary | ICD-10-CM | POA: Diagnosis not present

## 2017-09-09 DIAGNOSIS — E782 Mixed hyperlipidemia: Secondary | ICD-10-CM | POA: Diagnosis not present

## 2017-09-09 DIAGNOSIS — I10 Essential (primary) hypertension: Secondary | ICD-10-CM | POA: Diagnosis not present

## 2017-09-09 DIAGNOSIS — Z0000 Encounter for general adult medical examination without abnormal findings: Secondary | ICD-10-CM | POA: Diagnosis not present

## 2017-10-04 DIAGNOSIS — Z23 Encounter for immunization: Secondary | ICD-10-CM | POA: Diagnosis not present

## 2017-12-23 ENCOUNTER — Encounter: Payer: Self-pay | Admitting: Cardiovascular Disease

## 2017-12-23 DIAGNOSIS — I10 Essential (primary) hypertension: Secondary | ICD-10-CM | POA: Diagnosis not present

## 2017-12-23 DIAGNOSIS — Z125 Encounter for screening for malignant neoplasm of prostate: Secondary | ICD-10-CM | POA: Diagnosis not present

## 2017-12-23 DIAGNOSIS — J4521 Mild intermittent asthma with (acute) exacerbation: Secondary | ICD-10-CM | POA: Diagnosis not present

## 2017-12-23 DIAGNOSIS — Z0000 Encounter for general adult medical examination without abnormal findings: Secondary | ICD-10-CM | POA: Diagnosis not present

## 2017-12-23 DIAGNOSIS — E7849 Other hyperlipidemia: Secondary | ICD-10-CM | POA: Diagnosis not present

## 2018-02-04 DIAGNOSIS — Z79899 Other long term (current) drug therapy: Secondary | ICD-10-CM | POA: Diagnosis not present

## 2018-02-04 DIAGNOSIS — E119 Type 2 diabetes mellitus without complications: Secondary | ICD-10-CM | POA: Diagnosis not present

## 2018-02-04 DIAGNOSIS — R079 Chest pain, unspecified: Secondary | ICD-10-CM | POA: Diagnosis not present

## 2018-02-04 DIAGNOSIS — R0602 Shortness of breath: Secondary | ICD-10-CM | POA: Diagnosis not present

## 2018-02-04 DIAGNOSIS — Z7982 Long term (current) use of aspirin: Secondary | ICD-10-CM | POA: Diagnosis not present

## 2018-02-04 DIAGNOSIS — Z87891 Personal history of nicotine dependence: Secondary | ICD-10-CM | POA: Diagnosis not present

## 2018-02-04 DIAGNOSIS — I10 Essential (primary) hypertension: Secondary | ICD-10-CM | POA: Diagnosis not present

## 2018-03-06 DIAGNOSIS — I10 Essential (primary) hypertension: Secondary | ICD-10-CM | POA: Diagnosis not present

## 2018-03-06 DIAGNOSIS — K5730 Diverticulosis of large intestine without perforation or abscess without bleeding: Secondary | ICD-10-CM | POA: Diagnosis not present

## 2018-03-06 DIAGNOSIS — M1990 Unspecified osteoarthritis, unspecified site: Secondary | ICD-10-CM | POA: Diagnosis not present

## 2018-03-06 DIAGNOSIS — J45909 Unspecified asthma, uncomplicated: Secondary | ICD-10-CM | POA: Diagnosis not present

## 2018-03-06 DIAGNOSIS — Z1211 Encounter for screening for malignant neoplasm of colon: Secondary | ICD-10-CM | POA: Diagnosis not present

## 2018-03-06 DIAGNOSIS — D122 Benign neoplasm of ascending colon: Secondary | ICD-10-CM | POA: Diagnosis not present

## 2018-03-10 DIAGNOSIS — E7849 Other hyperlipidemia: Secondary | ICD-10-CM | POA: Diagnosis not present

## 2018-03-10 DIAGNOSIS — I10 Essential (primary) hypertension: Secondary | ICD-10-CM | POA: Diagnosis not present

## 2018-03-10 DIAGNOSIS — J4521 Mild intermittent asthma with (acute) exacerbation: Secondary | ICD-10-CM | POA: Diagnosis not present

## 2018-03-10 HISTORY — PX: COLONOSCOPY: SHX174

## 2018-03-25 ENCOUNTER — Emergency Department (HOSPITAL_COMMUNITY): Payer: Medicare Other

## 2018-03-25 ENCOUNTER — Emergency Department (HOSPITAL_COMMUNITY)
Admission: EM | Admit: 2018-03-25 | Discharge: 2018-03-25 | Discharge status: Against Medical Advice | Payer: Medicare Other | Attending: Emergency Medicine | Admitting: Emergency Medicine

## 2018-03-25 ENCOUNTER — Encounter (HOSPITAL_COMMUNITY): Payer: Self-pay | Admitting: Emergency Medicine

## 2018-03-25 ENCOUNTER — Other Ambulatory Visit: Payer: Self-pay

## 2018-03-25 DIAGNOSIS — E1140 Type 2 diabetes mellitus with diabetic neuropathy, unspecified: Secondary | ICD-10-CM | POA: Insufficient documentation

## 2018-03-25 DIAGNOSIS — I10 Essential (primary) hypertension: Secondary | ICD-10-CM | POA: Insufficient documentation

## 2018-03-25 DIAGNOSIS — Z7982 Long term (current) use of aspirin: Secondary | ICD-10-CM | POA: Diagnosis not present

## 2018-03-25 DIAGNOSIS — Z87891 Personal history of nicotine dependence: Secondary | ICD-10-CM | POA: Diagnosis not present

## 2018-03-25 DIAGNOSIS — R200 Anesthesia of skin: Secondary | ICD-10-CM | POA: Insufficient documentation

## 2018-03-25 DIAGNOSIS — R0602 Shortness of breath: Secondary | ICD-10-CM | POA: Diagnosis not present

## 2018-03-25 DIAGNOSIS — R072 Precordial pain: Secondary | ICD-10-CM | POA: Diagnosis not present

## 2018-03-25 DIAGNOSIS — R202 Paresthesia of skin: Secondary | ICD-10-CM | POA: Diagnosis not present

## 2018-03-25 DIAGNOSIS — R079 Chest pain, unspecified: Secondary | ICD-10-CM | POA: Diagnosis not present

## 2018-03-25 DIAGNOSIS — R0789 Other chest pain: Secondary | ICD-10-CM | POA: Diagnosis not present

## 2018-03-25 DIAGNOSIS — Z79899 Other long term (current) drug therapy: Secondary | ICD-10-CM | POA: Insufficient documentation

## 2018-03-25 LAB — CBC
HEMATOCRIT: 42.7 % (ref 39.0–52.0)
Hemoglobin: 13.9 g/dL (ref 13.0–17.0)
MCH: 29.6 pg (ref 26.0–34.0)
MCHC: 32.6 g/dL (ref 30.0–36.0)
MCV: 91 fL (ref 78.0–100.0)
Platelets: 296 10*3/uL (ref 150–400)
RBC: 4.69 MIL/uL (ref 4.22–5.81)
RDW: 12.5 % (ref 11.5–15.5)
WBC: 11.4 10*3/uL — AB (ref 4.0–10.5)

## 2018-03-25 LAB — BASIC METABOLIC PANEL
ANION GAP: 11 (ref 5–15)
BUN: 16 mg/dL (ref 6–20)
CALCIUM: 9.4 mg/dL (ref 8.9–10.3)
CO2: 22 mmol/L (ref 22–32)
Chloride: 106 mmol/L (ref 101–111)
Creatinine, Ser: 1.2 mg/dL (ref 0.61–1.24)
GFR calc Af Amer: >60 mL/min (ref >60)
Glucose, Bld: 146 mg/dL — ABNORMAL HIGH (ref 65–99)
POTASSIUM: 3.7 mmol/L (ref 3.5–5.1)
SODIUM: 139 mmol/L (ref 135–145)

## 2018-03-25 LAB — TROPONIN I

## 2018-03-25 NOTE — ED Triage Notes (Signed)
Pt reports CP started this evening, had checked into another ED and the pain went away so he left. CP returned again and called EMS. Denies pain at this time. Pt took a Rolaids 45 minutes ago and EMS gave pt 324 ASA.

## 2018-03-25 NOTE — Discharge Instructions (Signed)
You were seen in the ED today with worsening chest pain. You are deciding to leave the Emergency Department against medical advice. You understand that we could be missing the early signs of a heart attack and delaying this diagnosis could lead to significant disability or even death. You are welcome to return at any time for treatment if you chang your mind. Please call 911 immediately with any new or worsening chest pain. Call your Cardiologist first thing tomorrow morning.

## 2018-03-25 NOTE — ED Provider Notes (Signed)
Emergency Department Provider Note   I have reviewed the triage vital signs and the nursing notes.   HISTORY  Chief Complaint Chest Pain   HPI Albert Peterson is a 67 y.o. male with PMH of HLD, DM, HTN's to the emergency department for evaluation of intermittent chest pain this evening.  Patient had an initial episode of substernal chest tightness that was more severe than his typical episodes.  He experienced some associated right arm numbness/tingling symptoms and presented to the [facility] emergency department.  He left there without being seen because his pain resolved.  After returning home he had 2 additional episodes of chest pain which is very unusual for him.  Patient states that he typically has chest pain in the setting of difficulty breathing and has never experienced right arm symptoms with his chest pain before.  He states he is seen a cardiologist for these symptoms with his last heart catheterization in 2015.  He is also had an upper endoscopy in 2017 with no clear diagnosis given.  Patient does not have nitroglycerin at home. He was given ASA en route with EMS.   Past Medical History:  Diagnosis Date  . Benign essential hypertension   . Diverticulitis   . DM2 (diabetes mellitus, type 2) ([place])   . Eczema   . Hematuria   . Hypercholesteremia   . Hyperlipidemia   . Nephrolithiasis    Per patient "kidney cysts" not stones  . Psoriasis    right leg  . SOB (shortness of breath)     Patient Active Problem List   Diagnosis Date Noted  . Dyspepsia 06/29/2016  . Restrictive lung disease 11/18/2015  . HTN (hypertension), benign 07/26/2015  . Renal cyst, left 07/26/2015  . Nephrolithiasis 07/26/2015  . Dyspnea 07/26/2015  . Hyperlipidemia 07/26/2015  . Diverticulitis of colon 07/26/2015  . Psoriasis 07/26/2015  . Angina of effort ([place]) 07/26/2015  . Pancreatitis, acute 12/15/2014    Past Surgical History:  Procedure Laterality Date  . BIOPSY  07/26/2016   Procedure: BIOPSY;  Surgeon: Daneil Dolin, MD;  Location: [facility] ENDO SUITE;  Service: Endoscopy;;  gastric   . CATARACT EXTRACTION W/PHACO Right 12/17/2013   Procedure: CATARACT EXTRACTION PHACO AND INTRAOCULAR LENS PLACEMENT (IOC) CDE=13.21;  Surgeon: Tonny Branch, MD;  Location: [facility] ORS;  Service: Ophthalmology;  Laterality: Right;  . COLONOSCOPY     At [facility] 2012 or earlier; records requested  . ESOPHAGOGASTRODUODENOSCOPY N/A 07/26/2016   Procedure: ESOPHAGOGASTRODUODENOSCOPY (EGD);  Surgeon: Daneil Dolin, MD;  Location: [facility] ENDO SUITE;  Service: Endoscopy;  Laterality: N/A;  730   . LEFT HEART CATH    . NECK SURGERY    . TRIGGER FINGER RELEASE      Current Outpatient Rx  . Order #: [id number] Class: Historical Med  . Order #: [id number] Class: Historical Med  . Order #: [id number] Class: Historical Med  . Order #: [id number] Class: Historical Med  . Order #: [id number] Class: Historical Med    Allergies Patient has no known allergies.  Family History  Problem Relation Age of Onset  . Colon cancer Father        Unknown age of diagnosis  . Cancer Mother        ? type  . Breast cancer Maternal Aunt   . Lung disease Neg Hx     Social History Social History   Tobacco Use  . Smoking status: Former Smoker    Packs/day: 1.50    Years: 15.00    Pack years:  22.50    Types: Cigarettes    Start date: 12/11/1963    Last attempt to quit: 12/10/1993    Years since quitting: 24.3  . Smokeless tobacco: Never Used  . Tobacco comment: Quit smoking for 13 years at one point  Substance Use Topics  . Alcohol use: No    Alcohol/week: 0.0 oz    Comment: None currently; previous "youth experience" as a teenager  . Drug use: No    Review of Systems  Constitutional: No fever/chills Eyes: No visual changes. ENT: No sore throat. Cardiovascular: Positive chest pain. Respiratory: Denies shortness of breath. Gastrointestinal: No abdominal pain.  No nausea, no vomiting.  No diarrhea.  No  constipation. Genitourinary: Negative for dysuria. Musculoskeletal: Negative for back pain. Skin: Negative for rash. Neurological: Negative for headaches, focal weakness. Positive right arm paresthesias.   10-point ROS otherwise negative.  ____________________________________________   PHYSICAL EXAM:  VITAL SIGNS: ED Triage Vitals  Enc Vitals Group     BP 03/25/18 2039 (!) 154/83     Pulse Rate 03/25/18 2039 74     Resp 03/25/18 2039 18     Temp 03/25/18 2039 (!) 97.5 F (36.4 C)     Temp Source 03/25/18 2039 Oral     SpO2 03/25/18 2039 98 %     Weight 03/25/18 2040 223 lb (101.2 kg)     Height 03/25/18 2040 5\' 8"  (1.727 m)     Pain Score 03/25/18 2040 0   Constitutional: Alert and oriented. Well appearing and in no acute distress. Eyes: Conjunctivae are normal. Head: Atraumatic. Nose: No congestion/rhinnorhea. Mouth/Throat: Mucous membranes are moist.  Neck: No stridor. Cardiovascular: Normal rate, regular rhythm. Good peripheral circulation. Grossly normal heart sounds.   Respiratory: Normal respiratory effort.  No retractions. Lungs CTAB. Gastrointestinal: Soft and nontender. No distention.  Musculoskeletal: No lower extremity tenderness nor edema. No gross deformities of extremities. Neurologic:  Normal speech and language. No gross focal neurologic deficits are appreciated.  Skin:  Skin is warm, dry and intact. No rash noted.  ____________________________________________   LABS (all labs ordered are listed, but only abnormal results are displayed)  Labs Reviewed  BASIC METABOLIC PANEL - Abnormal; Notable for the following components:      Result Value   Glucose, Bld 146 (*)    All other components within normal limits  CBC - Abnormal; Notable for the following components:   WBC 11.4 (*)    All other components within normal limits  TROPONIN I   ____________________________________________  EKG   EKG Interpretation  Date/Time:  Tuesday March 25 2018  20:42:59 EDT Ventricular Rate:  77 PR Interval:  164 QRS Duration: 80 QT Interval:  384 QTC Calculation: 434 R Axis:   2 Text Interpretation:  Normal sinus rhythm Left ventricular hypertrophy Inferior infarct , age undetermined Abnormal ECG since last tracing no significant change Confirmed by Noemi Chapel [phone]) on 03/25/2018 9:04:36 PM       ____________________________________________  RADIOLOGY  Dg Chest 2 View  Result Date: 03/25/2018 CLINICAL DATA:  Chest pain EXAM: CHEST - 2 VIEW COMPARISON:  02/04/2018 FINDINGS: Anterior cervical fusion. Normal cardiac silhouette. No effusion, infiltrate pneumothorax. subtle linear interstitial markings extend to the peripheral lung. No acute osseous abnormality. IMPRESSION: Mild interstitial edema pattern. Electronically Signed   By: Suzy Bouchard M.D.   On: 03/25/2018 21:10    ____________________________________________   PROCEDURES  Procedure(s) performed:   Procedures  None ____________________________________________   INITIAL IMPRESSION / ASSESSMENT AND PLAN /  ED COURSE  Pertinent labs & imaging results that were available during my care of the patient were reviewed by me and considered in my medical decision making (see chart for details).  Patient presents to the emergency department for evaluation of intermittent chest pain is evening with right arm paresthesias.  No neuro findings on my exam.  His chest pain is resolved.  I reviewed his EKG, chest x-ray, lab work with no acute findings or suggestion of NSTEMI.  I am concerned given the story of his symptoms.  He does experience intermittent chest pain episodes but none like this in the past.  He has several risk factors for acute coronary syndrome and his most recent stress testing was in 2017 with last cath in 2015 at a [facility] facility.  He was given aspirin prior to arrival.  I counseled the patient and his wife at bedside that I would advise overnight observation for  enzyme trending and consideration of provocative testing in the morning.  The patient verbalizes his frustration that labs and EKG are always "normal."  Tried to explain the importance of additional testing and possible stress testing to the patient but he is adamant about returning home.  We discussed the possibility of missing an unstable angina episode which could progress to NSTEMI/STEMI.  He understands the risks of leaving [place] including significant morbidity and mortality.  I allowed the patient time to think about his decision and discuss with his wife privately but he is adamant about returning home at this time.  Provided written return precautions and contact information for a local cardiologist.  I told her that he is welcome to return to the emergency department any time should he change his mind about accepting treatment.   ____________________________________________  FINAL CLINICAL IMPRESSION(S) / ED DIAGNOSES  Final diagnoses:  Precordial chest pain    Note:  This document was prepared using Dragon voice recognition software and may include unintentional dictation errors.  Nanda Quinton, MD Emergency Medicine    Maalik Pinn, [facility], MD 03/25/18 [phone]

## 2018-03-31 DIAGNOSIS — I201 Angina pectoris with documented spasm: Secondary | ICD-10-CM | POA: Diagnosis not present

## 2018-04-02 DIAGNOSIS — R931 Abnormal findings on diagnostic imaging of heart and coronary circulation: Secondary | ICD-10-CM | POA: Diagnosis not present

## 2018-04-02 DIAGNOSIS — I201 Angina pectoris with documented spasm: Secondary | ICD-10-CM | POA: Diagnosis not present

## 2018-04-02 DIAGNOSIS — R079 Chest pain, unspecified: Secondary | ICD-10-CM | POA: Diagnosis not present

## 2018-04-03 ENCOUNTER — Telehealth: Payer: Self-pay | Admitting: Cardiovascular Disease

## 2018-04-03 ENCOUNTER — Encounter: Payer: Self-pay | Admitting: Cardiovascular Disease

## 2018-04-03 NOTE — Telephone Encounter (Signed)
I was called by Dr. Sherrie Sport about this patient.  He is a 67 year old gentleman with a history of GERD who has been experiencing chest pain associated with exertion with accompanying diaphoresis.  He reportedly underwent an abnormal nuclear stress test which showed ischemia and there were ST depressions, LVEF 43%.  He was started on aspirin, Plavix, and Imdur by his PCP.  I have asked my staff to schedule a new patient appointment with me early next week.

## 2018-04-07 ENCOUNTER — Other Ambulatory Visit: Payer: Self-pay | Admitting: Cardiovascular Disease

## 2018-04-07 ENCOUNTER — Encounter: Payer: Self-pay | Admitting: Cardiovascular Disease

## 2018-04-07 ENCOUNTER — Ambulatory Visit (INDEPENDENT_AMBULATORY_CARE_PROVIDER_SITE_OTHER): Payer: Medicare Other | Admitting: Cardiovascular Disease

## 2018-04-07 VITALS — BP 138/76 | HR 77 | Ht 68.0 in | Wt 221.0 lb

## 2018-04-07 DIAGNOSIS — R9439 Abnormal result of other cardiovascular function study: Secondary | ICD-10-CM | POA: Diagnosis not present

## 2018-04-07 DIAGNOSIS — I209 Angina pectoris, unspecified: Secondary | ICD-10-CM

## 2018-04-07 DIAGNOSIS — I25118 Atherosclerotic heart disease of native coronary artery with other forms of angina pectoris: Secondary | ICD-10-CM | POA: Diagnosis not present

## 2018-04-07 DIAGNOSIS — I10 Essential (primary) hypertension: Secondary | ICD-10-CM

## 2018-04-07 DIAGNOSIS — Z9289 Personal history of other medical treatment: Secondary | ICD-10-CM | POA: Diagnosis not present

## 2018-04-07 NOTE — Progress Notes (Signed)
CARDIOLOGY CONSULT NOTE  Patient ID: Albert Peterson MRN: [id number] DOB/AGE: 06-12-1951 67 y.o.  Admit date: (Not on file) Primary Physician: Neale Burly, MD Referring Physician: Neale Burly, MD  Reason for Consultation: Chest pain and abnormal stress test  HPI: Albert Peterson is a 67 y.o. male who is being seen today for the evaluation of chest pain and abnormal stress test at the request of [facility]*.   I was called by his PCP last week due to the patient experiencing chest pain with exertion and diaphoresis.  He told me he started him on aspirin, Plavix, and Imdur.  I reviewed extensive documentation from his PCP including labs and studies.  Nuclear stress test on Apr 21, 2018 demonstrated a "tiny reversible defect in the lateral wall near the apex "with LVEF 43%.  This was felt to be an underestimation due to extracardiac activity as per the report.  I reviewed the ED notes dated 02/04/2018.  Blood pressure was severely elevated at 202/115.  Chest x-ray showed low lung volumes.  Other reported blood pressure was 191/103.  I reviewed labs: Sodium 137, potassium 3.7, BUN 14, creatinine 1.25, N-terminal proBNP 113.8, white blood cells 10.6, hemoglobin 14.1, platelets 280.  Troponins were normal.  D-dimer was also normal.  I personally reviewed the ECG dated 02/04/2018 which demonstrated normal sinus rhythm with diffuse nonspecific ST segment abnormalities and nonspecific T wave abnormalities particularly in the lateral leads.  He underwent coronary angiography on 10/01/2014 at [facility].  This demonstrated 50% proximal eccentric LAD stenosis just before the origin of the first diagonal branch.  There was 30% stenosis in the mid LAD beyond the first diagonal and 50% eccentric distal stenosis.  There was ectasia throughout the proximal to mid left circumflex and a 50 to 60% ostial proximal stenosis.  There was 20 to 30% proximal RCA stenosis.  He was having  chest pain at that time with uncontrolled blood pressure.  Nuclear stress test at that time was deemed to be falsely positive.  I personally reviewed an echocardiogram dated 11/25/2015 which showed normal left ventricular systolic function, LVEF 60 to 65%, with grade 1 diastolic dysfunction.  After his coronary angiogram in 2015, he was evaluated by pulmonary and GI.  He had some mild restriction on pulmonary function testing in 2016 and there was some consideration for an atypical pulmonary infection and the differential diagnosis, as his symptoms significant improved after a 3-week course of antibiotics.  He underwent an EGD in December 2017 which demonstrated a normal esophagus and erosive gastropathy with a few gastric antral lesions.  His symptoms are significantly worse after eating.  He has exertional chest pain and dyspnea.  He has also had right arm numbness.  He is usually very active and he has had a marked degree of fatigue.  His symptoms have not improved on the medications his PCP prescribed.  He is here with his wife who works at the [employer].  He is originally from [place], [place].    No Known Allergies  Current Outpatient Medications  Medication Sig Dispense Refill  . aspirin 81 MG tablet Take 81 mg by mouth daily.    . clopidogrel (PLAVIX) 75 MG tablet Take 75 mg by mouth 2 (two) times daily.  0  . hydrALAZINE (APRESOLINE) 50 MG tablet Take 25 mg by mouth 2 (two) times daily.   2  . isosorbide mononitrate (IMDUR) 30 MG 24 hr tablet Take 30 mg  by mouth daily.  0  . ranitidine (ZANTAC) 150 MG tablet Take 150 mg by mouth 2 (two) times daily.     No current facility-administered medications for this visit.     Past Medical History:  Diagnosis Date  . Benign essential hypertension   . Diverticulitis   . DM2 (diabetes mellitus, type 2) ([place])   . Eczema   . Hematuria   . Hypercholesteremia   . Hyperlipidemia   . Nephrolithiasis    Per patient "kidney cysts"  not stones  . Psoriasis    right leg  . SOB (shortness of breath)     Past Surgical History:  Procedure Laterality Date  . BIOPSY  07/26/2016   Procedure: BIOPSY;  Surgeon: Daneil Dolin, MD;  [facility];  Service: Endoscopy;;  gastric   . CATARACT EXTRACTION W/PHACO Right 12/17/2013   Procedure: CATARACT EXTRACTION PHACO AND INTRAOCULAR LENS PLACEMENT (IOC) CDE=13.21;  Surgeon: Tonny Branch, MD;  Location: [facility] ORS;  Service: Ophthalmology;  Laterality: Right;  . COLONOSCOPY  03/2018   At [facility] 2012 or earlier; records requested  . ESOPHAGOGASTRODUODENOSCOPY N/A 07/26/2016   Procedure: ESOPHAGOGASTRODUODENOSCOPY (EGD);  Surgeon: Daneil Dolin, MD;  Location: [facility];  Service: Endoscopy;  Laterality: N/A;  730   . LEFT HEART CATH    . NECK SURGERY    . TRIGGER FINGER RELEASE      Social History   Socioeconomic History  . Marital status: Married    Spouse name: Not on file  . Number of children: 1  . Years of education: Not on file  . Highest education level: Not on file  Occupational History  . Occupation: shipping and receiving   Social Needs  . Financial resource strain: Not on file  . Food insecurity:    Worry: Not on file    Inability: Not on file  . Transportation needs:    Medical: Not on file    Non-medical: Not on file  Tobacco Use  . Smoking status: Former Smoker    Packs/day: 1.50    Years: 15.00    Pack years: 22.50    Types: Cigarettes    Start date: 12/11/1963    Last attempt to quit: 12/10/1993    Years since quitting: 24.3  . Smokeless tobacco: Never Used  . Tobacco comment: Quit smoking for 13 years at one point  Substance and Sexual Activity  . Alcohol use: No    Alcohol/week: 0.0 oz    Comment: None currently; previous "youth experience" as a teenager  . Drug use: No  . Sexual activity: Not on file  Lifestyle  . Physical activity:    Days per week: Not on file    Minutes per session: Not on file  . Stress: Not on file    Relationships  . Social connections:    Talks on phone: Not on file    Gets together: Not on file    Attends religious service: Not on file    Active member of club or organization: Not on file    Attends meetings of clubs or organizations: Not on file    Relationship status: Not on file  . Intimate partner violence:    Fear of current or ex partner: Not on file    Emotionally abused: Not on file    Physically abused: Not on file    Forced sexual activity: Not on file  Other Topics Concern  . Not on file  Social History Narrative  Originally from [place]. He moved to [place] in 1973. He has only lived here and [place]. He has traveled to [place], [place], [place], [place], [place], and states along the way to [place]. No international travel. 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 No history of exposure to exotic lumber. Has a dog at home. No bird, mold, or hot tub exposure. 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 No known asbestos exposure.      No family history of premature CAD in 1st degree relatives.  Current Meds  Medication Sig  . aspirin 81 MG tablet Take 81 mg by mouth daily.  . clopidogrel (PLAVIX) 75 MG tablet Take 75 mg by mouth 2 (two) times daily.  . hydrALAZINE (APRESOLINE) 50 MG tablet Take 25 mg by mouth 2 (two) times daily.   . isosorbide mononitrate (IMDUR) 30 MG 24 hr tablet Take 30 mg by mouth daily.  . ranitidine (ZANTAC) 150 MG tablet Take 150 mg by mouth 2 (two) times daily.      Review of systems complete and found to be negative unless listed above in HPI    Physical exam Blood pressure 138/76, pulse 77, height 5\' 8"  (1.727 m), weight 221 lb (100.2 kg), SpO2 97 %. General: NAD Neck: No JVD, no thyromegaly or thyroid nodule.  Lungs: Clear to auscultation bilaterally with normal respiratory effort. CV: Nondisplaced PMI. Regular rate and rhythm, normal S1/S2, no S3/S4, no murmur.  No peripheral edema.  No carotid bruit.   Abdomen: Soft, nontender, no distention.  Skin: Intact without lesions or rashes.  Neurologic: Alert and oriented x 3.  Psych: Normal affect. Extremities: No clubbing or cyanosis.  HEENT: Normal.   ECG: Most recent ECG reviewed.   Labs: Lab Results  Component Value Date/Time   K 3.7 03/25/2018 09:03 PM   BUN 16 03/25/2018 09:03 PM   CREATININE 1.20 03/25/2018 09:03 PM   HGB 13.9 03/25/2018 09:03 PM     Lipids: No results found for: LDLCALC, LDLDIRECT, CHOL, TRIG, HDL      ASSESSMENT AND PLAN:  1.  Chest pain with exertional dyspnea and fatigue with abnormal stress test: I am concerned that coronary artery disease has progressed to the point of becoming hemodynamically significant and his symptoms represent angina pectoris CCS class III.  I will stop both Plavix and Imdur.  He is on aspirin 81 mg.  He will likely require statin therapy. I will arrange for left heart catheterization and coronary angiography. Risks and benefits of cardiac catheterization have been discussed with the patient.  These include bleeding, infection, kidney damage, stroke, heart attack, death.  The patient understands these risks and is willing to proceed.  2.  Hypertension: Blood pressure is controlled on present therapy.  I will continue to monitor.     Disposition: Follow up in 1 month  Signed: Kate Sable, M.D., F.A.C.C.  04/07/2018, 4:00 PM

## 2018-04-07 NOTE — Patient Instructions (Signed)
   [facility] [place] [address] Dept: [phone] Loc: [phone]  Albert Peterson  04/07/2018  You are scheduled for a Cardiac Catheterization Monday, May 6 with Dr.Peter Martinique  1. Please arrive at the [facility] (Main Entrance A) at [facility]: [address] [place], [place] [address] at 5:30 AM (two hours before your procedure to ensure your preparation). Free valet parking service is available.   Special note: Every effort is made to have your procedure done on time. Please understand that emergencies sometimes delay scheduled procedures.  2. Diet: Do not eat or drink anything after midnight prior to your procedure except sips of water to take medications.  3. Labs: get lab work tomorrow  4. Medication instructions in preparation for your procedure: take medications as usual  On the morning of your procedure, take your Aspirin and any morning medicines NOT listed above.  You may use sips of water.  5. Plan for one night stay--bring personal belongings. 6. Bring a current list of your medications and current insurance cards. 7. You MUST have a responsible person to drive you home. 8. Someone MUST be with you the first 24 hours after you arrive home or your discharge will be delayed. 9. Please wear clothes that are easy to get on and off and wear slip-on shoes.  Thank you for allowing [place] to care for you!   -- [facility]

## 2018-04-07 NOTE — H&P (View-Only) (Signed)
CARDIOLOGY CONSULT NOTE  Patient ID: Albert Peterson MRN: [id number] DOB/AGE: June 26, 1951 67 y.o.  Admit date: (Not on file) Primary Physician: Neale Burly, MD Referring Physician: Neale Burly, MD  Reason for Consultation: Chest pain and abnormal stress test  HPI: Albert Peterson is a 67 y.o. male who is being seen today for the evaluation of chest pain and abnormal stress test at the request of [facility]*.   I was called by his PCP last week due to the patient experiencing chest pain with exertion and diaphoresis.  He told me he started him on aspirin, Plavix, and Imdur.  I reviewed extensive documentation from his PCP including labs and studies.  Nuclear stress test on 05-Apr-2018 demonstrated a "tiny reversible defect in the lateral wall near the apex "with LVEF 43%.  This was felt to be an underestimation due to extracardiac activity as per the report.  I reviewed the ED notes dated 02/04/2018.  Blood pressure was severely elevated at 202/115.  Chest x-ray showed low lung volumes.  Other reported blood pressure was 191/103.  I reviewed labs: Sodium 137, potassium 3.7, BUN 14, creatinine 1.25, N-terminal proBNP 113.8, white blood cells 10.6, hemoglobin 14.1, platelets 280.  Troponins were normal.  D-dimer was also normal.  I personally reviewed the ECG dated 02/04/2018 which demonstrated normal sinus rhythm with diffuse nonspecific ST segment abnormalities and nonspecific T wave abnormalities particularly in the lateral leads.  He underwent coronary angiography on 10/01/2014 at [facility].  This demonstrated 50% proximal eccentric LAD stenosis just before the origin of the first diagonal branch.  There was 30% stenosis in the mid LAD beyond the first diagonal and 50% eccentric distal stenosis.  There was ectasia throughout the proximal to mid left circumflex and a 50 to 60% ostial proximal stenosis.  There was 20 to 30% proximal RCA stenosis.  He was having  chest pain at that time with uncontrolled blood pressure.  Nuclear stress test at that time was deemed to be falsely positive.  I personally reviewed an echocardiogram dated 11/25/2015 which showed normal left ventricular systolic function, LVEF 60 to 65%, with grade 1 diastolic dysfunction.  After his coronary angiogram in 2015, he was evaluated by pulmonary and GI.  He had some mild restriction on pulmonary function testing in 2016 and there was some consideration for an atypical pulmonary infection and the differential diagnosis, as his symptoms significant improved after a 3-week course of antibiotics.  He underwent an EGD in December 2017 which demonstrated a normal esophagus and erosive gastropathy with a few gastric antral lesions.  His symptoms are significantly worse after eating.  He has exertional chest pain and dyspnea.  He has also had right arm numbness.  He is usually very active and he has had a marked degree of fatigue.  His symptoms have not improved on the medications his PCP prescribed.  He is here with his wife who works at the [employer].  He is originally from [place], [place].    No Known Allergies  Current Outpatient Medications  Medication Sig Dispense Refill  . aspirin 81 MG tablet Take 81 mg by mouth daily.    . clopidogrel (PLAVIX) 75 MG tablet Take 75 mg by mouth 2 (two) times daily.  0  . hydrALAZINE (APRESOLINE) 50 MG tablet Take 25 mg by mouth 2 (two) times daily.   2  . isosorbide mononitrate (IMDUR) 30 MG 24 hr tablet Take 30 mg  by mouth daily.  0  . ranitidine (ZANTAC) 150 MG tablet Take 150 mg by mouth 2 (two) times daily.     No current facility-administered medications for this visit.     Past Medical History:  Diagnosis Date  . Benign essential hypertension   . Diverticulitis   . DM2 (diabetes mellitus, type 2) ([place])   . Eczema   . Hematuria   . Hypercholesteremia   . Hyperlipidemia   . Nephrolithiasis    Per patient "kidney cysts"  not stones  . Psoriasis    right leg  . SOB (shortness of breath)     Past Surgical History:  Procedure Laterality Date  . BIOPSY  07/26/2016   Procedure: BIOPSY;  Surgeon: Daneil Dolin, MD;  [facility];  Service: Endoscopy;;  gastric   . CATARACT EXTRACTION W/PHACO Right 12/17/2013   Procedure: CATARACT EXTRACTION PHACO AND INTRAOCULAR LENS PLACEMENT (IOC) CDE=13.21;  Surgeon: Tonny Branch, MD;  Location: [facility];  Service: Ophthalmology;  Laterality: Right;  . COLONOSCOPY  03/2018   At [facility] 2012 or earlier; records requested  . ESOPHAGOGASTRODUODENOSCOPY N/A 07/26/2016   Procedure: ESOPHAGOGASTRODUODENOSCOPY (EGD);  Surgeon: Daneil Dolin, MD;  Location[facility];  Service: Endoscopy;  Laterality: N/A;  730   . LEFT HEART CATH    . NECK SURGERY    . TRIGGER FINGER RELEASE      Social History   Socioeconomic History  . Marital status: Married    Spouse name: Not on file  . Number of children: 1  . Years of education: Not on file  . Highest education level: Not on file  Occupational History  . Occupation: shipping and receiving   Social Needs  . Financial resource strain: Not on file  . Food insecurity:    Worry: Not on file    Inability: Not on file  . Transportation needs:    Medical: Not on file    Non-medical: Not on file  Tobacco Use  . Smoking status: Former Smoker    Packs/day: 1.50    Years: 15.00    Pack years: 22.50    Types: Cigarettes    Start date: 12/11/1963    Last attempt to quit: 12/10/1993    Years since quitting: 24.3  . Smokeless tobacco: Never Used  . Tobacco comment: Quit smoking for 13 years at one point  Substance and Sexual Activity  . Alcohol use: No    Alcohol/week: 0.0 oz    Comment: None currently; previous "youth experience" as a teenager  . Drug use: No  . Sexual activity: Not on file  Lifestyle  . Physical activity:    Days per week: Not on file    Minutes per session: Not on file  . Stress: Not on file    Relationships  . Social connections:    Talks on phone: Not on file    Gets together: Not on file    Attends religious service: Not on file    Active member of club or organization: Not on file    Attends meetings of clubs or organizations: Not on file    Relationship status: Not on file  . Intimate partner violence:    Fear of current or ex partner: Not on file    Emotionally abused: Not on file    Physically abused: Not on file    Forced sexual activity: Not on file  Other Topics Concern  . Not on file  Social History Narrative  Originally from [place]. He moved to [place] in 1973. He has only lived here and [place]. He has traveled to [place], [place], [place], [place], [place], and states along the way to [place]. No international travel. 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 No history of exposure to exotic lumber. Has a dog at home. No bird, mold, or hot tub exposure. 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 No known asbestos exposure.      No family history of premature CAD in 1st degree relatives.  Current Meds  Medication Sig  . aspirin 81 MG tablet Take 81 mg by mouth daily.  . clopidogrel (PLAVIX) 75 MG tablet Take 75 mg by mouth 2 (two) times daily.  . hydrALAZINE (APRESOLINE) 50 MG tablet Take 25 mg by mouth 2 (two) times daily.   . isosorbide mononitrate (IMDUR) 30 MG 24 hr tablet Take 30 mg by mouth daily.  . ranitidine (ZANTAC) 150 MG tablet Take 150 mg by mouth 2 (two) times daily.      Review of systems complete and found to be negative unless listed above in HPI    Physical exam Blood pressure 138/76, pulse 77, height 5\' 8"  (1.727 m), weight 221 lb (100.2 kg), SpO2 97 %. General: NAD Neck: No JVD, no thyromegaly or thyroid nodule.  Lungs: Clear to auscultation bilaterally with normal respiratory effort. CV: Nondisplaced PMI. Regular rate and rhythm, normal S1/S2, no S3/S4, no murmur.  No peripheral edema.  No carotid bruit.   Abdomen: Soft, nontender, no distention.  Skin: Intact without lesions or rashes.  Neurologic: Alert and oriented x 3.  Psych: Normal affect. Extremities: No clubbing or cyanosis.  HEENT: Normal.   ECG: Most recent ECG reviewed.   Labs: Lab Results  Component Value Date/Time   K 3.7 03/25/2018 09:03 PM   BUN 16 03/25/2018 09:03 PM   CREATININE 1.20 03/25/2018 09:03 PM   HGB 13.9 03/25/2018 09:03 PM     Lipids: No results found for: LDLCALC, LDLDIRECT, CHOL, TRIG, HDL      ASSESSMENT AND PLAN:  1.  Chest pain with exertional dyspnea and fatigue with abnormal stress test: I am concerned that coronary artery disease has progressed to the point of becoming hemodynamically significant and his symptoms represent angina pectoris CCS class III.  I will stop both Plavix and Imdur.  He is on aspirin 81 mg.  He will likely require statin therapy. I will arrange for left heart catheterization and coronary angiography. Risks and benefits of cardiac catheterization have been discussed with the patient.  These include bleeding, infection, kidney damage, stroke, heart attack, death.  The patient understands these risks and is willing to proceed.  2.  Hypertension: Blood pressure is controlled on present therapy.  I will continue to monitor.     Disposition: Follow up in 1 month  Signed: Kate Sable, M.D., F.A.C.C.  04/07/2018, 4:00 PM

## 2018-04-08 ENCOUNTER — Other Ambulatory Visit (HOSPITAL_COMMUNITY)
Admission: RE | Admit: 2018-04-08 | Discharge: 2018-04-08 | Disposition: A | Discharge status: Home / Self Care | Payer: Medicare Other | Source: Ambulatory Visit | Attending: Cardiovascular Disease | Admitting: Cardiovascular Disease

## 2018-04-08 DIAGNOSIS — R9439 Abnormal result of other cardiovascular function study: Secondary | ICD-10-CM | POA: Diagnosis not present

## 2018-04-08 DIAGNOSIS — I209 Angina pectoris, unspecified: Secondary | ICD-10-CM | POA: Diagnosis not present

## 2018-04-08 LAB — CBC WITH DIFFERENTIAL/PLATELET
Basophils Absolute: 0.1 10*3/uL (ref 0.0–0.1)
Basophils Relative: 1 %
EOS ABS: 0.2 10*3/uL (ref 0.0–0.7)
Eosinophils Relative: 2 %
HEMATOCRIT: 45.8 % (ref 39.0–52.0)
HEMOGLOBIN: 14.9 g/dL (ref 13.0–17.0)
LYMPHS PCT: 18 %
Lymphs Abs: 1.5 10*3/uL (ref 0.7–4.0)
MCH: 29.6 pg (ref 26.0–34.0)
MCHC: 32.5 g/dL (ref 30.0–36.0)
MCV: 90.9 fL (ref 78.0–100.0)
Monocytes Absolute: 0.6 10*3/uL (ref 0.1–1.0)
Monocytes Relative: 7 %
NEUTROS ABS: 6.1 10*3/uL (ref 1.7–7.7)
NEUTROS PCT: 72 %
Platelets: 319 10*3/uL (ref 150–400)
RBC: 5.04 MIL/uL (ref 4.22–5.81)
RDW: 12.2 % (ref 11.5–15.5)
WBC: 8.4 10*3/uL (ref 4.0–10.5)

## 2018-04-08 LAB — BASIC METABOLIC PANEL
ANION GAP: 11 (ref 5–15)
BUN: 14 mg/dL (ref 6–20)
CHLORIDE: 103 mmol/L (ref 101–111)
CO2: 24 mmol/L (ref 22–32)
CREATININE: 1.2 mg/dL (ref 0.61–1.24)
Calcium: 9.5 mg/dL (ref 8.9–10.3)
GFR calc non Af Amer: >60 mL/min (ref >60)
Glucose, Bld: 129 mg/dL — ABNORMAL HIGH (ref 65–99)
POTASSIUM: 4 mmol/L (ref 3.5–5.1)
Sodium: 138 mmol/L (ref 135–145)

## 2018-04-08 LAB — PROTIME-INR
INR: 1.04
PROTHROMBIN TIME: 13.5 s (ref 11.4–15.2)

## 2018-04-09 ENCOUNTER — Encounter

## 2018-04-09 ENCOUNTER — Ambulatory Visit: Payer: BLUE CROSS/BLUE SHIELD | Admitting: Cardiology

## 2018-04-10 ENCOUNTER — Telehealth: Payer: Self-pay | Admitting: *Deleted

## 2018-04-10 NOTE — Telephone Encounter (Signed)
Pt contacted pre-catheterization scheduled at [facility] for: Monday Apr 14, 2018 7:30 AM Verified arrival time and place: [facility] at: 5:30 AM  No solid food after midnight prior to cath, clear liquids until 5 AM Verified allergies in Epic Verified no diabetes medications.   AM meds can be  taken pre-cath with sip of water including: ASA 81 mg  Confirmed patient has responsible person to drive home post procedure and observe patient for 24 hours: yes

## 2018-04-14 ENCOUNTER — Other Ambulatory Visit: Payer: Self-pay | Admitting: *Deleted

## 2018-04-14 ENCOUNTER — Other Ambulatory Visit: Payer: Self-pay

## 2018-04-14 ENCOUNTER — Inpatient Hospital Stay (HOSPITAL_COMMUNITY)
Admission: RE | Admit: 2018-04-14 | Discharge: 2018-04-22 | DRG: 234 | Disposition: A | Discharge status: Home Health | Payer: Medicare Other | Source: Ambulatory Visit | Attending: Cardiothoracic Surgery | Admitting: Cardiothoracic Surgery

## 2018-04-14 ENCOUNTER — Encounter (HOSPITAL_COMMUNITY): Payer: Self-pay | Admitting: Surgery

## 2018-04-14 ENCOUNTER — Inpatient Hospital Stay (HOSPITAL_COMMUNITY)
Admission: RE | Disposition: A | Discharge status: Home Health | Payer: Self-pay | Source: Ambulatory Visit | Attending: Cardiothoracic Surgery

## 2018-04-14 DIAGNOSIS — I2510 Atherosclerotic heart disease of native coronary artery without angina pectoris: Secondary | ICD-10-CM

## 2018-04-14 DIAGNOSIS — E119 Type 2 diabetes mellitus without complications: Secondary | ICD-10-CM | POA: Diagnosis present

## 2018-04-14 DIAGNOSIS — Z800 Family history of malignant neoplasm of digestive organs: Secondary | ICD-10-CM

## 2018-04-14 DIAGNOSIS — Z01810 Encounter for preprocedural cardiovascular examination: Secondary | ICD-10-CM | POA: Diagnosis not present

## 2018-04-14 DIAGNOSIS — E8770 Fluid overload, unspecified: Secondary | ICD-10-CM | POA: Diagnosis not present

## 2018-04-14 DIAGNOSIS — I10 Essential (primary) hypertension: Secondary | ICD-10-CM | POA: Diagnosis not present

## 2018-04-14 DIAGNOSIS — D62 Acute posthemorrhagic anemia: Secondary | ICD-10-CM | POA: Diagnosis not present

## 2018-04-14 DIAGNOSIS — J811 Chronic pulmonary edema: Secondary | ICD-10-CM | POA: Diagnosis not present

## 2018-04-14 DIAGNOSIS — I208 Other forms of angina pectoris: Secondary | ICD-10-CM | POA: Diagnosis present

## 2018-04-14 DIAGNOSIS — J9811 Atelectasis: Secondary | ICD-10-CM | POA: Diagnosis not present

## 2018-04-14 DIAGNOSIS — Z951 Presence of aortocoronary bypass graft: Secondary | ICD-10-CM

## 2018-04-14 DIAGNOSIS — N179 Acute kidney failure, unspecified: Secondary | ICD-10-CM | POA: Diagnosis not present

## 2018-04-14 DIAGNOSIS — I25110 Atherosclerotic heart disease of native coronary artery with unstable angina pectoris: Secondary | ICD-10-CM | POA: Diagnosis not present

## 2018-04-14 DIAGNOSIS — R918 Other nonspecific abnormal finding of lung field: Secondary | ICD-10-CM | POA: Diagnosis not present

## 2018-04-14 DIAGNOSIS — E7800 Pure hypercholesterolemia, unspecified: Secondary | ICD-10-CM | POA: Diagnosis not present

## 2018-04-14 DIAGNOSIS — Z9689 Presence of other specified functional implants: Secondary | ICD-10-CM

## 2018-04-14 DIAGNOSIS — E785 Hyperlipidemia, unspecified: Secondary | ICD-10-CM | POA: Diagnosis present

## 2018-04-14 DIAGNOSIS — Z87891 Personal history of nicotine dependence: Secondary | ICD-10-CM | POA: Diagnosis not present

## 2018-04-14 DIAGNOSIS — I25810 Atherosclerosis of coronary artery bypass graft(s) without angina pectoris: Secondary | ICD-10-CM | POA: Diagnosis not present

## 2018-04-14 DIAGNOSIS — Z09 Encounter for follow-up examination after completed treatment for conditions other than malignant neoplasm: Secondary | ICD-10-CM

## 2018-04-14 DIAGNOSIS — Z7982 Long term (current) use of aspirin: Secondary | ICD-10-CM | POA: Diagnosis not present

## 2018-04-14 DIAGNOSIS — Z803 Family history of malignant neoplasm of breast: Secondary | ICD-10-CM

## 2018-04-14 DIAGNOSIS — J90 Pleural effusion, not elsewhere classified: Secondary | ICD-10-CM | POA: Diagnosis not present

## 2018-04-14 DIAGNOSIS — I209 Angina pectoris, unspecified: Secondary | ICD-10-CM | POA: Diagnosis not present

## 2018-04-14 DIAGNOSIS — I25119 Atherosclerotic heart disease of native coronary artery with unspecified angina pectoris: Secondary | ICD-10-CM

## 2018-04-14 DIAGNOSIS — I200 Unstable angina: Secondary | ICD-10-CM | POA: Diagnosis present

## 2018-04-14 DIAGNOSIS — I2089 Other forms of angina pectoris: Secondary | ICD-10-CM | POA: Diagnosis present

## 2018-04-14 DIAGNOSIS — I083 Combined rheumatic disorders of mitral, aortic and tricuspid valves: Secondary | ICD-10-CM | POA: Diagnosis not present

## 2018-04-14 DIAGNOSIS — Z7902 Long term (current) use of antithrombotics/antiplatelets: Secondary | ICD-10-CM

## 2018-04-14 DIAGNOSIS — Z4682 Encounter for fitting and adjustment of non-vascular catheter: Secondary | ICD-10-CM | POA: Diagnosis not present

## 2018-04-14 DIAGNOSIS — R079 Chest pain, unspecified: Secondary | ICD-10-CM | POA: Diagnosis not present

## 2018-04-14 HISTORY — PX: LEFT HEART CATH AND CORONARY ANGIOGRAPHY: CATH118249

## 2018-04-14 LAB — URINALYSIS, ROUTINE W REFLEX MICROSCOPIC
Bilirubin Urine: NEGATIVE
Glucose, UA: NEGATIVE mg/dL
Hgb urine dipstick: NEGATIVE
Ketones, ur: NEGATIVE mg/dL
Leukocytes, UA: NEGATIVE
Nitrite: NEGATIVE
Protein, ur: NEGATIVE mg/dL
Specific Gravity, Urine: 1.032 — ABNORMAL HIGH (ref 1.005–1.030)
pH: 6 (ref 5.0–8.0)

## 2018-04-14 LAB — HEPARIN LEVEL (UNFRACTIONATED)

## 2018-04-14 LAB — GLUCOSE, CAPILLARY: GLUCOSE-CAPILLARY: 97 mg/dL (ref 65–99)

## 2018-04-14 SURGERY — LEFT HEART CATH AND CORONARY ANGIOGRAPHY
Anesthesia: LOCAL

## 2018-04-14 MED ORDER — ISOSORBIDE MONONITRATE ER 30 MG PO TB24
30.0000 mg | ORAL_TABLET | Freq: Every day | ORAL | Status: DC
  Administered 2018-04-14 – 2018-04-15 (×2): 30 mg via ORAL
  Filled 2018-04-14 (×2): qty 1

## 2018-04-14 MED ORDER — ONDANSETRON HCL 4 MG/2ML IJ SOLN
4.0000 mg | Freq: Four times a day (QID) | INTRAMUSCULAR | Status: DC | PRN
Start: 2018-04-14 — End: 2018-04-16

## 2018-04-14 MED ORDER — SODIUM CHLORIDE 0.9 % WEIGHT BASED INFUSION: 1.0000 mL/kg/h | INTRAVENOUS | Status: DC

## 2018-04-14 MED ORDER — MIDAZOLAM HCL 2 MG/2ML IJ SOLN
INTRAMUSCULAR | Status: AC
  Filled 2018-04-14: qty 2

## 2018-04-14 MED ORDER — ASPIRIN 81 MG PO CHEW: 81.0000 mg | CHEWABLE_TABLET | ORAL | Status: DC

## 2018-04-14 MED ORDER — SODIUM CHLORIDE 0.9% FLUSH: 3.0000 mL | INTRAVENOUS | Status: DC | PRN

## 2018-04-14 MED ORDER — LIDOCAINE HCL (PF) 1 % IJ SOLN
INTRAMUSCULAR | Status: AC
  Filled 2018-04-14: qty 30

## 2018-04-14 MED ORDER — LIDOCAINE HCL (PF) 1 % IJ SOLN
INTRAMUSCULAR | Status: DC | PRN
  Administered 2018-04-14: 2 mL

## 2018-04-14 MED ORDER — SODIUM CHLORIDE 0.9 % IV SOLN: 250.0000 mL | INTRAVENOUS | Status: DC | PRN

## 2018-04-14 MED ORDER — IOHEXOL 350 MG/ML SOLN
INTRAVENOUS | Status: DC | PRN
  Administered 2018-04-14: 90 mL via INTRAVENOUS

## 2018-04-14 MED ORDER — SODIUM CHLORIDE 0.9% FLUSH: 3.0000 mL | Freq: Two times a day (BID) | INTRAVENOUS | Status: DC

## 2018-04-14 MED ORDER — HEPARIN SODIUM (PORCINE) 1000 UNIT/ML IJ SOLN
INTRAMUSCULAR | Status: AC
  Filled 2018-04-14: qty 1

## 2018-04-14 MED ORDER — ACETAMINOPHEN 325 MG PO TABS: 650.0000 mg | ORAL_TABLET | ORAL | Status: DC | PRN

## 2018-04-14 MED ORDER — MIDAZOLAM HCL 2 MG/2ML IJ SOLN
INTRAMUSCULAR | Status: DC | PRN
  Administered 2018-04-14: 1 mg via INTRAVENOUS

## 2018-04-14 MED ORDER — SODIUM CHLORIDE 0.9 % WEIGHT BASED INFUSION: 1.0000 mL/kg/h | INTRAVENOUS | Status: AC

## 2018-04-14 MED ORDER — VERAPAMIL HCL 2.5 MG/ML IV SOLN
INTRAVENOUS | Status: AC
  Filled 2018-04-14: qty 2

## 2018-04-14 MED ORDER — SODIUM CHLORIDE 0.9 % WEIGHT BASED INFUSION
3.0000 mL/kg/h | INTRAVENOUS | Status: DC
  Administered 2018-04-14: 3 mL/kg/h via INTRAVENOUS

## 2018-04-14 MED ORDER — FENTANYL CITRATE (PF) 100 MCG/2ML IJ SOLN
INTRAMUSCULAR | Status: AC
  Filled 2018-04-14: qty 2

## 2018-04-14 MED ORDER — HEPARIN (PORCINE) IN NACL 1000-0.9 UT/500ML-% IV SOLN
INTRAVENOUS | Status: AC
  Filled 2018-04-14: qty 1000

## 2018-04-14 MED ORDER — VERAPAMIL HCL 2.5 MG/ML IV SOLN
INTRAVENOUS | Status: DC | PRN
  Administered 2018-04-14: 10 mL via INTRA_ARTERIAL

## 2018-04-14 MED ORDER — FENTANYL CITRATE (PF) 100 MCG/2ML IJ SOLN
INTRAMUSCULAR | Status: DC | PRN
  Administered 2018-04-14: 25 ug via INTRAVENOUS

## 2018-04-14 MED ORDER — METOPROLOL SUCCINATE ER 25 MG PO TB24
25.0000 mg | ORAL_TABLET | Freq: Every day | ORAL | Status: DC
  Administered 2018-04-14 – 2018-04-15 (×2): 25 mg via ORAL
  Filled 2018-04-14 (×2): qty 1

## 2018-04-14 MED ORDER — HEPARIN (PORCINE) IN NACL 100-0.45 UNIT/ML-% IJ SOLN
1250.0000 [IU]/h | INTRAMUSCULAR | Status: DC
  Administered 2018-04-14: 1000 [IU]/h via INTRAVENOUS
  Administered 2018-04-15: 1250 [IU]/h via INTRAVENOUS
  Filled 2018-04-14 (×2): qty 250

## 2018-04-14 MED ORDER — HEPARIN SODIUM (PORCINE) 1000 UNIT/ML IJ SOLN
INTRAMUSCULAR | Status: DC | PRN
  Administered 2018-04-14: 5000 [IU] via INTRAVENOUS

## 2018-04-14 MED ORDER — ROSUVASTATIN CALCIUM 10 MG PO TABS
40.0000 mg | ORAL_TABLET | Freq: Every day | ORAL | Status: DC
  Administered 2018-04-14 – 2018-04-21 (×7): 40 mg via ORAL
  Filled 2018-04-14: qty 4
  Filled 2018-04-14 (×5): qty 1
  Filled 2018-04-14: qty 4

## 2018-04-14 MED ORDER — ROSUVASTATIN CALCIUM 20 MG PO TABS: 20.0000 mg | ORAL_TABLET | Freq: Every day | ORAL | 11 refills | Status: DC

## 2018-04-14 MED ORDER — HEPARIN (PORCINE) IN NACL 2-0.9 UNITS/ML
INTRAMUSCULAR | Status: AC | PRN
  Administered 2018-04-14 (×2): 500 mL

## 2018-04-14 MED ORDER — SODIUM CHLORIDE 0.9% FLUSH
3.0000 mL | Freq: Two times a day (BID) | INTRAVENOUS | Status: DC
  Administered 2018-04-15 (×2): 3 mL via INTRAVENOUS

## 2018-04-14 SURGICAL SUPPLY — 13 items
BAND CMPR LRG ZPHR (HEMOSTASIS) ×1
BAND ZEPHYR COMPRESS 30 LONG (HEMOSTASIS) ×1 IMPLANT
CATH INFINITI 5 FR JL3.5 (CATHETERS) ×1 IMPLANT
CATH INFINITI 5FR ANG PIGTAIL (CATHETERS) ×1 IMPLANT
CATH INFINITI JR4 5F (CATHETERS) ×1 IMPLANT
GLIDESHEATH SLEND SS 6F .021 (SHEATH) ×1 IMPLANT
GUIDEWIRE INQWIRE 1.5J.035X260 (WIRE) IMPLANT
INQWIRE 1.5J .035X260CM (WIRE) ×2
KIT HEART LEFT (KITS) ×2 IMPLANT
PACK CARDIAC CATHETERIZATION (CUSTOM PROCEDURE TRAY) ×2 IMPLANT
SYR MEDRAD MARK V 150ML (SYRINGE) ×2 IMPLANT
TRANSDUCER W/STOPCOCK (MISCELLANEOUS) ×2 IMPLANT
TUBING CIL FLEX 10 FLL-RA (TUBING) ×2 IMPLANT

## 2018-04-14 NOTE — Consult Note (Signed)
[address]       [place] [address]             [phone]                    [name] Rice Medical Record #[id number] Date of Birth: Dec 06, 1951  Referring: No ref. provider found Primary Care: Neale Burly, MD Primary Cardiologist: Kate Sable, MD  Chief Complaint: Chest pain and shortness of breath   History of Present Illness:    Albert Peterson 67 y.o. male is seen in the hospital today for three-vessel coronary artery disease.  The patient gives a history of 4 to 5 years of exertional shortness of breath.  He underwent cardiac catheterization at no [place] in 2015, no significant disease was noted and the patient was referred to the pulmonologist.  No specific pulmonary diagnosis was made.  The patient notes more recently increasing episodes of both shortness of breath and chest discomfort sometimes with exertion and sometimes at rest.  Because of the increasing severity and frequency of the symptoms he was referred to Dr. Bronson Ing.  Stress test was positive, the patient noted similar symptoms during the stress test, he required nitroglycerin and oxygen at the completion.  The patient was then referred for cardiac catheterization today, 2 days ago while at a car so he noted recurrent chest pain and took the nitroglycerin of 1 of his friends with relief.       Current Activity/ Functional Status:  Patient is independent with mobility/ambulation, transfers, ADL's, IADL's.   Zubrod Score: At the time of surgery this patient's most appropriate activity status/level should be described as: []     0    Normal activity, no symptoms [x]     1    Restricted in physical strenuous activity but ambulatory, able to do out light work []     2    Ambulatory and capable of self care, unable to do work activities, up and about               >50 % of waking hours                              []     3    Only limited self care, in bed greater than 50%  of waking hours []     4    Completely disabled, no self care, confined to bed or chair []     5    Moribund   Past Medical History:  Diagnosis Date  . Benign essential hypertension   . Diverticulitis   . DM2 (diabetes mellitus, type 2) ([facility])   . Eczema   . Hematuria   . Hypercholesteremia   . Hyperlipidemia   . Nephrolithiasis    Per patient "kidney cysts" not stones  . Psoriasis    right leg  . SOB (shortness of breath)     Past Surgical History:  Procedure Laterality Date  . BIOPSY  07/26/2016   Procedure: BIOPSY;  Surgeon: Daneil Dolin, MD;  Location: [facility];  Service: Endoscopy;;  gastric   . CATARACT EXTRACTION W/PHACO Right 12/17/2013   Procedure: CATARACT EXTRACTION PHACO AND INTRAOCULAR LENS PLACEMENT (IOC) CDE=13.21;  Surgeon: Tonny Branch, MD;  Location: [facility];  Service: Ophthalmology;  Laterality: Right;  . COLONOSCOPY  03/2018   At [facility] 2012 or earlier; records requested  .  ESOPHAGOGASTRODUODENOSCOPY N/A 07/26/2016   Procedure: ESOPHAGOGASTRODUODENOSCOPY (EGD);  Surgeon: Daneil Dolin, MD;  Location: [facility];  Service: Endoscopy;  Laterality: N/A;  730   . LEFT HEART CATH    . NECK SURGERY    . TRIGGER FINGER RELEASE      Family History  Problem Relation Age of Onset  . Colon cancer Father        Unknown age of diagnosis  . Cancer Mother        ? type  . Breast cancer Maternal Aunt   . Lung disease Neg Hx     Social History   Socioeconomic History  . Marital status: Married    Spouse name: Not on file  . Number of children: 1  . Years of education: Not on file  . Highest education level: Not on file  Occupational History  . Occupation: shipping and receiving   Social Needs  . Financial resource strain: Not on file  . Food insecurity:    Worry: Not on file    Inability: Not on file  . Transportation needs:    Medical: Not on file    Non-medical: Not on file  Tobacco Use  . Smoking status: Former Smoker    Packs/day: 1.50     Years: 15.00    Pack years: 22.50    Types: Cigarettes    Start date: 12/11/1963    Last attempt to quit: 12/10/1993    Years since quitting: 24.3  . Smokeless tobacco: Never Used  . Tobacco comment: Quit smoking for 13 years at one point  Substance and Sexual Activity  . Alcohol use: No    Alcohol/week: 0.0 oz    Comment: None currently; previous "youth experience" as a teenager  . Drug use: No  . Sexual activity: Not on file     Social History Narrative   Originally from [place]. He moved to [place] in 1973. He has only lived here and [place]. He has traveled to [place], [place], [place], [place], [place], and states along the way to [place]. No international travel. 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 No history of exposure to exotic lumber. Has a dog at home. No bird, mold, or hot tub exposure. 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 No known asbestos exposure.     Social History   Tobacco Use  Smoking Status Former Smoker  . Packs/day: 1.50  . Years: 15.00  . Pack years: 22.50  . Types: Cigarettes  . Start date: 12/11/1963  . Last attempt to quit: 12/10/1993  . Years since quitting: 24.3  Smokeless Tobacco Never Used  Tobacco Comment   Quit smoking for 13 years at one point    Social History   Substance and Sexual Activity  Alcohol Use No  . Alcohol/week: 0.0 oz   Comment: None currently; previous "youth experience" as a teenager     No Known Allergies  Current Facility-Administered Medications  Medication Dose Route Frequency Provider Last Rate Last Dose  . 0.9 %  sodium chloride infusion  250 mL Intravenous PRN Martinique, Peter M, MD      . acetaminophen (TYLENOL) tablet 650 mg  650 mg Oral Q4H PRN Martinique, Peter M, MD      . heparin ADULT infusion 100 units/mL (25000 units/243mL sodium chloride 0.45%)  1,000 Units/hr Intravenous Continuous Priscella Mann, RPH      . isosorbide mononitrate (IMDUR) 24 hr tablet 30 mg  30 mg Oral Daily Martinique,  Peter M, MD   30 mg at 04/14/18 7425  . metoprolol succinate (TOPROL-XL) 24 hr tablet 25 mg  25 mg Oral Daily Martinique, Peter M, MD   25 mg at 04/14/18 9563  . ondansetron (ZOFRAN) injection 4 mg  4 mg Intravenous Q6H PRN Martinique, Peter M, MD      . sodium chloride flush (NS) 0.9 % injection 3 mL  3 mL Intravenous Q12H Martinique, Peter M, MD      . sodium chloride flush (NS) 0.9 % injection 3 mL  3 mL Intravenous PRN Martinique, Peter M, MD        Pertinent items are noted in HPI.   Review of Systems:     Cardiac Review of Systems: [Y] = yes  or   [ N ] = no   Chest Pain [  y  ]  Resting SOB [  y ] Exertional SOB  Blue.Reese ]  Vertell Limber Florencio.Farrier  ]   Pedal Edema [ n  ]    Palpitations [ n ] Syncope  [n  ]   Presyncope [ n  ]   General Review of Systems: [Y] = yes [  ]=no Constitional: recent weight change [  ];  Wt loss over the last 3 months [   ] anorexia [  ]; fatigue [  ]; nausea [  ]; night sweats [  ]; fever [  ]; or chills [  ];          Dental: poor dentition[  ]; Last Dentist visit:   Eye : blurred vision [  ]; diplopia [   ]; vision changes [  ];  Amaurosis fugax[  ]; Resp: cough [  ];  wheezing[  ];  hemoptysis[  ]; shortness of breath[y  ]; paroxysmal nocturnal dyspnea[ y ]; dyspnea on exertion[ y ]; or orthopnea[  ];  GI:  gallstones[  ], vomiting[  ];  dysphagia[  ]; melena[  ];  hematochezia [  ]; heartburn[  ];   Hx of  Colonoscopy[  ]; GU: kidney stones [  ]; hematuria[  ];   dysuria [  ];  nocturia[  ];  history of     obstruction [  ]; urinary frequency [  ]             Skin: rash, swelling[  ];, hair loss[  ];  peripheral edema[  ];  or itching[  ]; Musculosketetal: myalgias[  ];  joint swelling[  ];  joint erythema[  ];  joint pain[  ];  back pain[  ];  Heme/Lymph: bruising[  ];  bleeding[  ];  anemia[  ];  Neuro: TIA[  ];  headaches[  ];  stroke[  ];  vertigo[  ];  seizures[  ];   paresthesias[  ];  difficulty walking[  ];  Psych:depression[  ]; anxiety[  ];  Endocrine: diabetes[  y];   thyroid dysfunction[  ];  Immunizations: Flu up to date [  ]; Pneumococcal up to date [  ];  Other:    PHYSICAL EXAMINATION: BP 135/69 (BP Location: Left Arm)   Pulse (!) 59   Temp 98.3 F (36.8 C) (Oral)   Resp (!) 24   Ht 5\' 8"  (1.727 m)   Wt 217 lb (98.4 kg)   SpO2 96%   BMI 32.99 kg/m  General appearance: alert, cooperative and no distress Head: Normocephalic, without obvious abnormality, atraumatic Neck: no adenopathy, no  carotid bruit, no JVD, supple, symmetrical, trachea midline and thyroid not enlarged, symmetric, no tenderness/mass/nodules Lymph nodes: Cervical, supraclavicular, and axillary nodes normal. Resp: clear to auscultation bilaterally Back: symmetric, no curvature. ROM normal. No CVA tenderness. Cardio: regular rate and rhythm, S1, S2 normal, no murmur, click, rub or gallop GI: soft, non-tender; bowel sounds normal; no masses,  no organomegaly Extremities: extremities normal, atraumatic, no cyanosis or edema and Homans sign is negative, no sign of DVT Neurologic: Grossly normal  Diagnostic Studies & Laboratory data:     Recent Radiology Findings:   Dg Chest 2 View  Result Date: 03/25/2018 CLINICAL DATA:  Chest pain EXAM: CHEST - 2 VIEW COMPARISON:  02/04/2018 FINDINGS: Anterior cervical fusion. Normal cardiac silhouette. No effusion, infiltrate pneumothorax. subtle linear interstitial markings extend to the peripheral lung. No acute osseous abnormality. IMPRESSION: Mild interstitial edema pattern. Electronically Signed   By: Suzy Bouchard M.D.   On: 03/25/2018 21:10     I have independently reviewed the above radiology studies  and reviewed the findings with the patient.   Recent Lab Findings: Lab Results  Component Value Date   WBC 8.4 04/08/2018   HGB 14.9 04/08/2018   HCT 45.8 04/08/2018   PLT 319 04/08/2018   GLUCOSE 129 (H) 04/08/2018   NA 138 04/08/2018   K 4.0 04/08/2018   CL 103 04/08/2018   CREATININE 1.20 04/08/2018   BUN 14 04/08/2018    CO2 24 04/08/2018   INR 1.04 04/08/2018   Cath 2015 DATE OF PROCEDURE: 10/01/2014 10:09 AM  PROCEDURAL INDICATIONS : Chest pain with abnormal Cardiolite at [facility]: See the admission narrative summary for additional details.Please see the recent office visit by me for additional details.   PROCEDURES PERFORMED:  1. Left heart catheterization 2. Selective Coronary angiography. 3. Single plane Left ventriculography 4. Pressure wire interrogation of the ostial proximal left circumflex with measurement of fractional flow reserve ratio  ACCESS LOCATION: Right Radial artery   DIAGNOSTIC CATHETERS: 5 [place] Tiger catheter 5 French pigtail catheter  Interventional equipment: 5 [place] EBU 3.5 guide catheter  [employer] V[place]ata Doppler pressure wire  MEDICATIONS: Please see the catheterization data report for additional details Verapamil 2.5 mg IA Heparin 7000 units  DESCRIPTION OF THE PROCEDURE: The indications as well as the potential benefits and risks ofthe procedure were discussed with the patient. Informed consent was obtained. The patient was brought to the catheterization laboratory and electrocardiographic  and hemodynamic monitoring was established. The patient was prepped and draped in the standard aseptic technique. Local topical analgesia was provided at the site of vascular access with 2% lidocaine. Vascular access was obtained via the modified  Seldinger technique with placement of a 6 French glide sheath into the right radial artery without difficult or complication.. Verapamil was administered intra-arterially to avoid spasm. The angiographic catheters were advanced and exchanged over a  guidewire and under fluoroscopic guidance and carefully aspirated and flushed to avoid any retained air. Selective coronary angiography was performed in multiple orthogonal views. Single plane left ventriculography was performed in a  single plain RAO  view. Following completion of the left heart catheterization Doppler pressure wire interrogation of the ostial-proximal segment of the left circumflex was undertaken. Heparin was administered. ACT was measured in additional heparin was administered. The  EBU guide was placed at the ostium of the left main coronary artery. The Doppler pressure wire was advanced into the left circumflex and was equalized and then adenosine infusion was administered  to investigate fractional flow reserve ratio. The patient  tolerated this procedure well.  Complications: None Fluoroscopic time: 15 minutes Total contrast: 75 cc Isovue.  FINDINGS:  Hemodynamics: 1. Aortic Pressure - 139/73 mmHg 2. Left Ventricular - 3/22 mmHg  Coronary Angiography: Anatomically right dominant coronary anatomy. Fluoroscopic calcifications were noted. 1. Left Main - mild angiographic plaque. No focal obstructive plaque. Bifurcates distally. 2. Left anterior descending artery - mild irregularities proximally. 50% proximal eccentric stenosis just before the origin of the first diagonal branch. 30% stenosis in the mid LAD just beyond the first diagonal branch. Mild diffuse irregularities.  There is a 50% eccentric distal stenosis noted. a. Diagonal branches - 2 diagonal branches. Mild irregularities. 3. Left Circumflex - anatomically nondominant. There is ectasia throughout the proximal to mid left circumflex. There is 50-60% angiographic great ostial proximal stenosis. No other focal stenosis noted. The distal left circumflex gave rise to a marginal  branch which bifurcated into 2 smaller subsidiary tributaries. This segment of the left circumflex had mild irregularities. a. Obtuse Marginal branches - Normal 4. Right Coronary Artery - anatomically dominant. Mild diffuse irregularities. 20-30% proximal plaque. No other focal plaque. Bifurcated distally. a. Posterior Descending Artery - mild  irregularities. b. Posterolateral Branch - mild irregularities.   Left Ventriculography -  1. Normal left ventricular chamber dimension. 2. Preserved left jugular ejection fraction 55%. Very mild anterolateral hypokinesia.  Doppler pressure wire interrogation (ostial-proximal left circumflex): -Fractional flow reserve ratio 0.92 (normal).    CONCLUSIONS:  1. Chest pain syndrome without evidence of obstructive coronary artery disease. 2.Hypertension which is then suboptimally control. 3. Presumed hyperlipidemia 4. Early diabetes mellitus. 5. False positive Cardiolite stress test.  RECOMMENDATIONS:  1. Aggressive medical management with risk factor modification, optimization of hypertension control, and initiation of lipid-lowering therapy. Continue aspirin therapy.  The results of the procedure was discussed with the patient at the time of completion of the study. The results were also discussed with the patient's wife at the time of completion of the procedure..   Cath today :  Procedures   LEFT HEART CATH AND CORONARY ANGIOGRAPHY  Conclusion     Prox RCA lesion is 50% stenosed.  Dist RCA lesion is 50% stenosed.  Ost Cx to Prox Cx lesion is 85% stenosed.  Prox Cx lesion is 80% stenosed.  Ost 3rd Mrg lesion is 70% stenosed.  Prox LAD lesion is 90% stenosed.  Mid LAD to Dist LAD lesion is 80% stenosed.  Dist LAD lesion is 90% stenosed.  The left ventricular systolic function is normal.  LV end diastolic pressure is normal.  The left ventricular ejection fraction is 55-65% by visual estimate.   1. 3 vessel obstructive CAD 2. Normal LV function 3. Normal LV EDP  Plan: Disease is not favorable for PCI. I would recommend CABG for revascularization.        Assessment / Plan:   Patient with positive stress test, symptoms of angina with minimal exertion and three-vessel coronary artery disease by cardiac catheterization.  I agree with Dr. Martinique due  to the anatomy the patient would best benefit from coronary artery bypass grafting.  Risks and options are discussed with him in detail and he is willing to proceed.  We will tentatively plan for surgery on Wednesday May 8.  Echocardiogram is pending to evaluate for  valve heart disease      Grace Isaac MD      [address] [place], [address] Office [phone]  [place] [phone]  04/14/2018 3:11 PM

## 2018-04-14 NOTE — Progress Notes (Signed)
[facility] for Heparin Indication: chest pain/ACS; awaiting CABG  No Known Allergies  Patient Measurements: Height: 5\' 8"  (172.7 cm) Weight: 217 lb (98.4 kg) IBW/kg (Calculated) : 68.4 Heparin Dosing Weight: 89.4 kg  Vital Signs: Temp: 98.3 F (36.8 C) (05/06 2338) Temp Source: Oral (05/06 2338) BP: 113/65 (05/06 2338) Pulse Rate: 57 (05/06 2338)  Labs: Recent Labs    04/14/18 2258  HEPARINUNFRC <0.10*    Estimated Creatinine Clearance: 67 mL/min (by C-G formula based on SCr of 1.2 mg/dL).   Medical History: Past Medical History:  Diagnosis Date  . Benign essential hypertension   . Diverticulitis   . DM2 (diabetes mellitus, type 2) ([facility])   . Eczema   . Hematuria   . Hypercholesteremia   . Hyperlipidemia   . Nephrolithiasis    Per patient "kidney cysts" not stones  . Psoriasis    right leg  . SOB (shortness of breath)    Assessment: 66 year old male admitted 04/14/18 for cardiac cath. Patient was found to have 3 vessel CAD with plan for CABG. Pharmacy was consulted to start IV Heparin 8 hours after catheter removal. Sheath was removed at at 07:51AM. Radial band was removed at 11:30AM. Last CBC on 4/30 at [facility] was within normal limits. SCr was 1.20 with estimated CrCl ~ 67 mL/min.   5/6 PM Update: heparin level undetectable, no issues per RN.   Goal of Therapy:  Heparin level 0.3-0.7 units/ml Monitor platelets by anticoagulation protocol: Yes   Plan:  Inc heparin to 1250 units/hr 0800 HL CABG 5/8  Narda Bonds, PharmD, BCPS Clinical Pharmacist Phone: [phone]

## 2018-04-14 NOTE — Progress Notes (Signed)
Radial band removed at 1130 am. Site level zero. Dressing and coban applied.

## 2018-04-14 NOTE — Interval H&P Note (Signed)
History and Physical Interval Note:  04/14/2018 7:15 AM  Albert Peterson  has presented today for surgery, with the diagnosis of abn stress chest pain  The various methods of treatment have been discussed with the patient and family. After consideration of risks, benefits and other options for treatment, the patient has consented to  Procedure(s): LEFT HEART CATH AND CORONARY ANGIOGRAPHY (N/A) as a surgical intervention .  The patient's history has been reviewed, patient examined, no change in status, stable for surgery.  I have reviewed the patient's chart and labs.  Questions were answered to the patient's satisfaction.   Cath Lab Visit (complete for each Cath Lab visit)  Clinical Evaluation Leading to the Procedure:   ACS: No.  Non-ACS:    Anginal Classification: CCS III  Anti-ischemic medical therapy: Minimal Therapy (1 class of medications)  Non-Invasive Test Results: Intermediate-risk stress test findings: cardiac mortality 1-3%/year  Prior CABG: No previous CABG        Albert Peterson 04/14/2018 7:15 AM

## 2018-04-14 NOTE — Progress Notes (Signed)
ANTICOAGULATION CONSULT NOTE - Initial Consult  Pharmacy Consult for Heparin Indication: chest pain/ACS; awaiting CABG  No Known Allergies  Patient Measurements: Height: 5\' 8"  (172.7 cm) Weight: 217 lb (98.4 kg) IBW/kg (Calculated) : 68.4 Heparin Dosing Weight: 89.4 kg  Vital Signs: Temp: 97.8 F (36.6 C) (05/06 0539) Temp Source: Oral (05/06 0539) BP: 133/79 (05/06 1300) Pulse Rate: 69 (05/06 1300)  Labs: No results for input(s): HGB, HCT, PLT, APTT, LABPROT, INR, HEPARINUNFRC, HEPRLOWMOCWT, CREATININE, CKTOTAL, CKMB, TROPONINI in the last 72 hours.  Estimated Creatinine Clearance: 67 mL/min (by C-G formula based on SCr of 1.2 mg/dL).   Medical History: Past Medical History:  Diagnosis Date  . Benign essential hypertension   . Diverticulitis   . DM2 (diabetes mellitus, type 2) ([place])   . Eczema   . Hematuria   . Hypercholesteremia   . Hyperlipidemia   . Nephrolithiasis    Per patient "kidney cysts" not stones  . Psoriasis    right leg  . SOB (shortness of breath)    Assessment: 67 year old male admitted 04/14/18 for cardiac cath. Patient was found to have 3 vessel CAD with plan for CABG. Pharmacy was consulted to start IV Heparin 8 hours after catheter removal. Sheath was removed at at 07:51AM. Radial band was removed at 11:30AM. Last CBC on 4/30 at [facility] was within normal limits. SCr was 1.20 with estimated CrCl ~ 67 mL/min.   Goal of Therapy:  Heparin level 0.3-0.7 units/ml Monitor platelets by anticoagulation protocol: Yes   Plan:  Start IV Heparin (no bolus) at 1600 PM at rate of 1000 units/hr.  Heparin level 6 hours after initiation.  Daily Heparin level and CBC while on therapy.   Sloan Leiter, PharmD, BCPS, BCCCP Clinical Pharmacist Clinical phone 04/14/2018 until 3:30PM [phone] After hours, please call [phone] 04/14/2018,1:41 PM

## 2018-04-15 ENCOUNTER — Inpatient Hospital Stay (HOSPITAL_COMMUNITY): Payer: Medicare Other

## 2018-04-15 ENCOUNTER — Encounter (HOSPITAL_COMMUNITY): Payer: Self-pay | Admitting: Cardiology

## 2018-04-15 DIAGNOSIS — I10 Essential (primary) hypertension: Secondary | ICD-10-CM

## 2018-04-15 DIAGNOSIS — I2510 Atherosclerotic heart disease of native coronary artery without angina pectoris: Secondary | ICD-10-CM

## 2018-04-15 DIAGNOSIS — I209 Angina pectoris, unspecified: Secondary | ICD-10-CM

## 2018-04-15 DIAGNOSIS — Z01810 Encounter for preprocedural cardiovascular examination: Secondary | ICD-10-CM

## 2018-04-15 DIAGNOSIS — R079 Chest pain, unspecified: Secondary | ICD-10-CM

## 2018-04-15 DIAGNOSIS — E7800 Pure hypercholesterolemia, unspecified: Secondary | ICD-10-CM

## 2018-04-15 LAB — PULMONARY FUNCTION TEST
DL/VA % pred: 108 %
DL/VA: 4.88 ml/min/mmHg/L
DLCO cor % pred: 70 %
DLCO cor: 20.99 ml/min/mmHg
DLCO unc % pred: 66 %
DLCO unc: 19.7 ml/min/mmHg
FEF 25-75 Post: 4.94 L/sec
FEF 25-75 Pre: 3.12 L/sec
FEF2575-%Change-Post: 58 %
FEF2575-%Pred-Post: 209 %
FEF2575-%Pred-Pre: 132 %
FEV1-%Change-Post: 11 %
FEV1-%Pred-Post: 95 %
FEV1-%Pred-Pre: 85 %
FEV1-Post: 2.93 L
FEV1-Pre: 2.63 L
FEV1FVC-%Change-Post: 6 %
FEV1FVC-%Pred-Pre: 112 %
FEV6-%Change-Post: 5 %
FEV6-%Pred-Post: 84 %
FEV6-%Pred-Pre: 80 %
FEV6-Post: 3.32 L
FEV6-Pre: 3.14 L
FEV6FVC-%Change-Post: 0 %
FEV6FVC-%Pred-Post: 106 %
FEV6FVC-%Pred-Pre: 105 %
FVC-%Change-Post: 4 %
FVC-%Pred-Post: 79 %
FVC-%Pred-Pre: 76 %
FVC-Post: 3.32 L
FVC-Pre: 3.17 L
Post FEV1/FVC ratio: 88 %
Post FEV6/FVC ratio: 100 %
Pre FEV1/FVC ratio: 83 %
Pre FEV6/FVC Ratio: 99 %
RV % pred: 62 %
RV: 1.45 L
TLC % pred: 69 %
TLC: 4.59 L

## 2018-04-15 LAB — COMPREHENSIVE METABOLIC PANEL
ALT: 17 U/L (ref 17–63)
AST: 14 U/L — ABNORMAL LOW (ref 15–41)
Albumin: 3.5 g/dL (ref 3.5–5.0)
Alkaline Phosphatase: 51 U/L (ref 38–126)
Anion gap: 6 (ref 5–15)
BUN: 12 mg/dL (ref 6–20)
CO2: 26 mmol/L (ref 22–32)
Calcium: 8.7 mg/dL — ABNORMAL LOW (ref 8.9–10.3)
Chloride: 107 mmol/L (ref 101–111)
Creatinine, Ser: 1.33 mg/dL — ABNORMAL HIGH (ref 0.61–1.24)
GFR calc Af Amer: >60 mL/min (ref >60)
GFR calc non Af Amer: 53 mL/min — ABNORMAL LOW (ref >60)
Glucose, Bld: 107 mg/dL — ABNORMAL HIGH (ref 65–99)
Potassium: 4.2 mmol/L (ref 3.5–5.1)
Sodium: 139 mmol/L (ref 135–145)
Total Bilirubin: 0.9 mg/dL (ref 0.3–1.2)
Total Protein: 6 g/dL — ABNORMAL LOW (ref 6.5–8.1)

## 2018-04-15 LAB — TYPE AND SCREEN
ABO/RH(D): O POS
Antibody Screen: NEGATIVE

## 2018-04-15 LAB — PROTIME-INR
INR: 1.16
Prothrombin Time: 14.7 seconds (ref 11.4–15.2)

## 2018-04-15 LAB — CBC
HCT: 39.2 % (ref 39.0–52.0)
Hemoglobin: 12.6 g/dL — ABNORMAL LOW (ref 13.0–17.0)
MCH: 29.4 pg (ref 26.0–34.0)
MCHC: 32.1 g/dL (ref 30.0–36.0)
MCV: 91.4 fL (ref 78.0–100.0)
PLATELETS: 235 10*3/uL (ref 150–400)
RBC: 4.29 MIL/uL (ref 4.22–5.81)
RDW: 12.2 % (ref 11.5–15.5)
WBC: 8 10*3/uL (ref 4.0–10.5)

## 2018-04-15 LAB — ECHOCARDIOGRAM COMPLETE
Height: 68 in
Weight: 3443.2 oz

## 2018-04-15 LAB — HEMOGLOBIN A1C
Hgb A1c MFr Bld: 5.9 % — ABNORMAL HIGH (ref 4.8–5.6)
Mean Plasma Glucose: 122.63 mg/dL

## 2018-04-15 LAB — ABO/RH: ABO/RH(D): O POS

## 2018-04-15 LAB — HEPARIN LEVEL (UNFRACTIONATED)
Heparin Unfractionated: 0.35 IU/mL (ref 0.30–0.70)
Heparin Unfractionated: 0.39 IU/mL (ref 0.30–0.70)

## 2018-04-15 MED ORDER — EPINEPHRINE PF 1 MG/ML IJ SOLN
0.0000 ug/min | INTRAVENOUS | Status: DC
  Filled 2018-04-15: qty 4

## 2018-04-15 MED ORDER — TRANEXAMIC ACID 1000 MG/10ML IV SOLN
1.5000 mg/kg/h | INTRAVENOUS | Status: AC
  Administered 2018-04-16: 1.5 mg/kg/h via INTRAVENOUS
  Filled 2018-04-15: qty 25

## 2018-04-15 MED ORDER — SODIUM CHLORIDE 0.9 % IV SOLN
INTRAVENOUS | Status: DC
  Filled 2018-04-15: qty 1

## 2018-04-15 MED ORDER — SODIUM CHLORIDE 0.9 % IV SOLN
INTRAVENOUS | Status: DC
  Filled 2018-04-15: qty 30

## 2018-04-15 MED ORDER — POTASSIUM CHLORIDE 2 MEQ/ML IV SOLN
80.0000 meq | INTRAVENOUS | Status: DC
  Filled 2018-04-15: qty 40

## 2018-04-15 MED ORDER — TRANEXAMIC ACID (OHS) PUMP PRIME SOLUTION
2.0000 mg/kg | INTRAVENOUS | Status: DC
  Filled 2018-04-15: qty 1.95

## 2018-04-15 MED ORDER — PLASMA-LYTE 148 IV SOLN
INTRAVENOUS | Status: AC
  Administered 2018-04-16: 10:00:00
  Filled 2018-04-15: qty 2.5

## 2018-04-15 MED ORDER — TEMAZEPAM 15 MG PO CAPS: 15.0000 mg | ORAL_CAPSULE | Freq: Once | ORAL | Status: DC | PRN

## 2018-04-15 MED ORDER — SODIUM CHLORIDE 0.9 % IV SOLN
30.0000 ug/min | INTRAVENOUS | Status: DC
  Filled 2018-04-15: qty 2

## 2018-04-15 MED ORDER — MAGNESIUM SULFATE 50 % IJ SOLN
40.0000 meq | INTRAMUSCULAR | Status: DC
  Filled 2018-04-15: qty 9.85

## 2018-04-15 MED ORDER — SODIUM CHLORIDE 0.9 % IV SOLN
750.0000 mg | INTRAVENOUS | Status: DC
  Filled 2018-04-15: qty 750

## 2018-04-15 MED ORDER — BISACODYL 5 MG PO TBEC
5.0000 mg | DELAYED_RELEASE_TABLET | Freq: Once | ORAL | Status: AC
  Administered 2018-04-15: 5 mg via ORAL
  Filled 2018-04-15: qty 1

## 2018-04-15 MED ORDER — VANCOMYCIN HCL 10 G IV SOLR
1500.0000 mg | INTRAVENOUS | Status: AC
  Administered 2018-04-16: 1500 mg via INTRAVENOUS
  Filled 2018-04-15: qty 1500

## 2018-04-15 MED ORDER — CHLORHEXIDINE GLUCONATE 0.12 % MT SOLN
15.0000 mL | Freq: Once | OROMUCOSAL | Status: AC
  Administered 2018-04-16: 15 mL via OROMUCOSAL
  Filled 2018-04-15: qty 15

## 2018-04-15 MED ORDER — CHLORHEXIDINE GLUCONATE CLOTH 2 % EX PADS
6.0000 | MEDICATED_PAD | Freq: Once | CUTANEOUS | Status: AC
  Administered 2018-04-15: 6 via TOPICAL

## 2018-04-15 MED ORDER — DEXMEDETOMIDINE HCL IN NACL 400 MCG/100ML IV SOLN
0.1000 ug/kg/h | INTRAVENOUS | Status: DC
  Filled 2018-04-15: qty 100

## 2018-04-15 MED ORDER — METOPROLOL TARTRATE 12.5 MG HALF TABLET
12.5000 mg | ORAL_TABLET | Freq: Once | ORAL | Status: AC
  Administered 2018-04-16: 12.5 mg via ORAL
  Filled 2018-04-15: qty 1

## 2018-04-15 MED ORDER — SODIUM CHLORIDE 0.9 % IV SOLN
1.5000 g | INTRAVENOUS | Status: DC
  Filled 2018-04-15: qty 1.5

## 2018-04-15 MED ORDER — PERFLUTREN LIPID MICROSPHERE
INTRAVENOUS | Status: AC
  Filled 2018-04-15: qty 10

## 2018-04-15 MED ORDER — DOPAMINE-DEXTROSE 3.2-5 MG/ML-% IV SOLN
0.0000 ug/kg/min | INTRAVENOUS | Status: DC
  Filled 2018-04-15: qty 250

## 2018-04-15 MED ORDER — MILRINONE LACTATE IN DEXTROSE 20-5 MG/100ML-% IV SOLN
0.1250 ug/kg/min | INTRAVENOUS | Status: DC
  Filled 2018-04-15: qty 100

## 2018-04-15 MED ORDER — TRANEXAMIC ACID (OHS) BOLUS VIA INFUSION
15.0000 mg/kg | INTRAVENOUS | Status: AC
  Administered 2018-04-16: 1464 mg via INTRAVENOUS
  Filled 2018-04-15: qty 1464

## 2018-04-15 MED ORDER — CHLORHEXIDINE GLUCONATE CLOTH 2 % EX PADS
6.0000 | MEDICATED_PAD | Freq: Once | CUTANEOUS | Status: AC
  Administered 2018-04-16: 6 via TOPICAL

## 2018-04-15 MED ORDER — NITROGLYCERIN IN D5W 200-5 MCG/ML-% IV SOLN
2.0000 ug/min | INTRAVENOUS | Status: AC
  Administered 2018-04-16: 5 ug/min via INTRAVENOUS
  Filled 2018-04-15: qty 250

## 2018-04-15 MED ORDER — ALBUTEROL SULFATE (2.5 MG/3ML) 0.083% IN NEBU
2.5000 mg | INHALATION_SOLUTION | Freq: Once | RESPIRATORY_TRACT | Status: AC
  Administered 2018-04-15: 2.5 mg via RESPIRATORY_TRACT

## 2018-04-15 NOTE — Progress Notes (Signed)
[place] for Heparin Indication: chest pain/ACS; awaiting CABG  No Known Allergies  Patient Measurements: Height: 5\' 8"  (172.7 cm) Weight: 215 lb 3.2 oz (97.6 kg)(scale b) IBW/kg (Calculated) : 68.4 Heparin Dosing Weight: 89.4 kg  Vital Signs: Temp: 97.8 F (36.6 C) (05/07 1043) Temp Source: Oral (05/07 1043) BP: 155/77 (05/07 1043) Pulse Rate: 70 (05/07 1043)  Labs: Recent Labs    04/14/18 2258 04/15/18 0610 04/15/18 1056  HGB  --  12.6*  --   HCT  --  39.2  --   PLT  --  235  --   LABPROT  --  14.7  --   INR  --  1.16  --   HEPARINUNFRC <0.10*  --  0.35  CREATININE  --  1.33*  --     Estimated Creatinine Clearance: 60.2 mL/min (A) (by C-G formula based on SCr of 1.33 mg/dL (H)).   Medical History: Past Medical History:  Diagnosis Date  . Benign essential hypertension   . Diverticulitis   . DM2 (diabetes mellitus, type 2) ([place])   . Eczema   . Hematuria   . Hypercholesteremia   . Hyperlipidemia   . Nephrolithiasis    Per patient "kidney cysts" not stones  . Psoriasis    right leg  . SOB (shortness of breath)    Assessment: 67 year old male admitted 04/14/18 for cardiac cath. Patient was found to have 3 vessel CAD with plan for CABG. Pharmacy was consulted to start IV Heparin 8 hours after catheter removal. Last CBC on 4/30 at [facility] was within normal limits. SCr was 1.20 with estimated CrCl ~ 67 mL/min.   heparin level this AM is 0.35 on heparin 1250 units/hr, a rate increase last night.  Hgb 12.6, pltc wnl.    Goal of Therapy:  Heparin level 0.3-0.7 units/ml Monitor platelets by anticoagulation protocol: Yes   Plan:  Continue heparin drip 1250 units/hr Check 6 hour HL to confirm remains therapeutic x2  Daily HL, CBC CABG 5/8  Nicole Cella, RPh Clinical Pharmacist Pager: [phone] 8A-4P [phone] 4P-10P [phone] [place] [phone] 04/15/2018 1:21 PM

## 2018-04-15 NOTE — Progress Notes (Signed)
  Echocardiogram 2D Echocardiogram has been performed.  Albert Peterson 04/15/2018, 9:20 AM

## 2018-04-15 NOTE — Anesthesia Preprocedure Evaluation (Addendum)
Anesthesia Evaluation  Patient identified by MRN, date of birth, ID band Patient awake    Reviewed: Allergy & Precautions, NPO status , Patient's Chart, lab work & pertinent test results  History of Anesthesia Complications Negative for: history of anesthetic complications  Airway Mallampati: I  TM Distance: >3 FB Neck ROM: Full    Dental  (+) Edentulous Lower, Edentulous Upper, Dental Advisory Given   Pulmonary former smoker (quit 1995),    Pulmonary exam normal        Cardiovascular hypertension, Pt. on medications + CAD (3v ASCADz)  Normal cardiovascular exam  04/15/18 ECHO: EF 55-60%, valves OK   Neuro/Psych    GI/Hepatic   Endo/Other  diabetesMorbid obesity  Renal/GU Renal InsufficiencyRenal disease (creat 1.33)     Musculoskeletal   Abdominal   Peds  Hematology   Anesthesia Other Findings   Reproductive/Obstetrics                            Anesthesia Physical Anesthesia Plan  ASA: IV  Anesthesia Plan: General   Post-op Pain Management:    Induction: Intravenous  PONV Risk Score and Plan: 3 and Ondansetron, Dexamethasone and Diphenhydramine  Airway Management Planned: Oral ETT  Additional Equipment: Arterial line, PA Cath, 3D TEE and Ultrasound Guidance Line Placement  Intra-op Plan:   Post-operative Plan: Post-operative intubation/ventilation  Informed Consent: I have reviewed the patients History and Physical, chart, labs and discussed the procedure including the risks, benefits and alternatives for the proposed anesthesia with the patient or authorized representative who has indicated his/her understanding and acceptance.   Dental advisory given  Plan Discussed with: Anesthesiologist, CRNA and Surgeon  Anesthesia Plan Comments:         Anesthesia Quick Evaluation

## 2018-04-15 NOTE — Progress Notes (Signed)
[facility] for Heparin Indication: chest pain/ACS; awaiting CABG  No Known Allergies  Patient Measurements: Height: 5\' 8"  (172.7 cm) Weight: 215 lb 3.2 oz (97.6 kg)(scale b) IBW/kg (Calculated) : 68.4 Heparin Dosing Weight: 89.4 kg  Vital Signs: Temp: 97.8 F (36.6 C) (05/07 1043) Temp Source: Oral (05/07 1043) BP: 155/77 (05/07 1043) Pulse Rate: 70 (05/07 1043)  Labs: Recent Labs    04/14/18 2258 04/15/18 0610 04/15/18 1056 04/15/18 1621  HGB  --  12.6*  --   --   HCT  --  39.2  --   --   PLT  --  235  --   --   LABPROT  --  14.7  --   --   INR  --  1.16  --   --   HEPARINUNFRC <0.10*  --  0.35 0.39  CREATININE  --  1.33*  --   --     Estimated Creatinine Clearance: 60.2 mL/min (A) (by C-G formula based on SCr of 1.33 mg/dL (H)).   Medical History: Past Medical History:  Diagnosis Date  . Benign essential hypertension   . Diverticulitis   . DM2 (diabetes mellitus, type 2) ([place])   . Eczema   . Hematuria   . Hypercholesteremia   . Hyperlipidemia   . Nephrolithiasis    Per patient "kidney cysts" not stones  . Psoriasis    right leg  . SOB (shortness of breath)    Assessment: 67 year old male admitted 04/14/18 for cardiac cath. Patient was found to have 3 vessel CAD with plan for CABG. Pharmacy was consulted for heparin dosing while awaiting CABG planned for 5/8.   Heparin level this evening remains therapeutic (HL 0.39, goal of 0.3-0.7). No bleeding noted. CABG planned for 5/8.  Goal of Therapy:  Heparin level 0.3-0.7 units/ml Monitor platelets by anticoagulation protocol: Yes   Plan:  Continue heparin drip 1250 units/hr Noted plans for CABG 5/8 AM Will continue to monitor for any signs/symptoms of bleeding and will follow up with heparin level in the a.m.   Thank you for allowing pharmacy to be a part of this patient's care.  Alycia Rossetti, PharmD, BCPS Clinical Pharmacist Pager: [phone] Clinical phone for  04/15/2018: [phone] 04/15/2018 5:45 PM

## 2018-04-15 NOTE — Progress Notes (Signed)
Progress Note  Patient Name: Albert Peterson Date of Encounter: 04/15/2018  Primary Cardiologist: Kate Sable, MD   Subjective   Pt denies chest pain. Awaiting PFTs  Inpatient Medications    Scheduled Meds: . perflutren lipid microspheres (DEFINITY) IV suspension      . isosorbide mononitrate  30 mg Oral Daily  . metoprolol succinate  25 mg Oral Daily  . rosuvastatin  40 mg Oral q1800  . sodium chloride flush  3 mL Intravenous Q12H   Continuous Infusions: . sodium chloride    . heparin 1,250 Units/hr (04/14/18 2356)   PRN Meds: sodium chloride, acetaminophen, ondansetron (ZOFRAN) IV, sodium chloride flush   Vital Signs    Vitals:   04/14/18 1639 04/14/18 2054 04/14/18 2338 04/15/18 0601  BP: 114/78 120/74 113/65 137/74  Pulse: 62 67 (!) 57 (!) 57  Resp:  18 16 18   Temp:  97.7 F (36.5 C) 98.3 F (36.8 C) 98.1 F (36.7 C)  TempSrc:  Oral Oral Oral  SpO2:  92% 97% 98%  Weight:    215 lb 3.2 oz (97.6 kg)  Height:        Intake/Output Summary (Last 24 hours) at 04/15/2018 0904 Last data filed at 04/15/2018 0600 Gross per 24 hour  Intake 389.33 ml  Output 950 ml  Net -560.67 ml   Filed Weights   04/14/18 0539 04/15/18 0601  Weight: 217 lb (98.4 kg) 215 lb 3.2 oz (97.6 kg)    Telemetry    Sinus - Personally Reviewed  ECG    No new tracings - Personally Reviewed  Physical Exam   GEN: No acute distress.   Neck: No JVD Cardiac: RRR, no murmurs, rubs, or gallops.  Respiratory: Clear to auscultation bilaterally. GI: Soft, nontender, non-distended  MS: No edema; No deformity. Neuro:  Nonfocal  Psych: Normal affect   Labs    Chemistry Recent Labs  Lab 04/15/18 0610  NA 139  K 4.2  CL 107  CO2 26  GLUCOSE 107*  BUN 12  CREATININE 1.33*  CALCIUM 8.7*  PROT 6.0*  ALBUMIN 3.5  AST 14*  ALT 17  ALKPHOS 51  BILITOT 0.9  GFRNONAA 53*  GFRAA >60  ANIONGAP 6     Hematology Recent Labs  Lab 04/15/18 0610  WBC 8.0  RBC 4.29  HGB  12.6*  HCT 39.2  MCV 91.4  MCH 29.4  MCHC 32.1  RDW 12.2  PLT 235    Cardiac EnzymesNo results for input(s): TROPONINI in the last 168 hours. No results for input(s): TROPIPOC in the last 168 hours.   BNPNo results for input(s): BNP, PROBNP in the last 168 hours.   DDimer No results for input(s): DDIMER in the last 168 hours.   Radiology    No results found.  Cardiac Studies   Left heart cath 04/14/18:  Prox RCA lesion is 50% stenosed.  Dist RCA lesion is 50% stenosed.  Ost Cx to Prox Cx lesion is 85% stenosed.  Prox Cx lesion is 80% stenosed.  Ost 3rd Mrg lesion is 70% stenosed.  Prox LAD lesion is 90% stenosed.  Mid LAD to Dist LAD lesion is 80% stenosed.  Dist LAD lesion is 90% stenosed.  The left ventricular systolic function is normal.  LV end diastolic pressure is normal.  The left ventricular ejection fraction is 55-65% by visual estimate.   1. 3 vessel obstructive CAD 2. Normal LV function 3. Normal LV EDP  Plan: Disease is not favorable for PCI.  I would recommend CABG for revascularization.  Patient Profile     67 y.o. male with abnormal stress test found to have mutli-vessel disease on heart cath, referred to [facility] for CABG.  Assessment & Plan    1. Multi-vessel CAD - will undergo CABG tomorrow - pt is chest pain free - continue heparin drip - continue imdur, toprol, and crestor   2. HTN - pressures are generally controlled - continue medications as above   For questions or updates, please contact [place] Please consult [email or web address] for contact info under Cardiology/STEMI.      Signed, [place], PA  04/15/2018, 9:04 AM

## 2018-04-15 NOTE — Progress Notes (Signed)
Patient ID: Albert Peterson, male   DOB: 10/20/1951, 67 y.o.   MRN: [id number]      [address]       [place] [address]             [phone]                 1 Day Post-Op Procedure(s) (LRB): LEFT HEART CATH AND CORONARY ANGIOGRAPHY (N/A)  LOS: 1 day   Subjective: No chest pain today, echo done this am  Objective: Vital signs in last 24 hours: Patient Vitals for the past 24 hrs:  BP Temp Temp src Pulse Resp SpO2 Weight  04/15/18 1043 (!) 155/77 97.8 F (36.6 C) Oral 70 18 97 % -  04/15/18 0601 137/74 98.1 F (36.7 C) Oral (!) 57 18 98 % 215 lb 3.2 oz (97.6 kg)  04/14/18 2338 113/65 98.3 F (36.8 C) Oral (!) 57 16 97 % -  04/14/18 2054 120/74 97.7 F (36.5 C) Oral 67 18 92 % -  04/14/18 1639 114/78 - - 62 - - -    Filed Weights   04/14/18 0539 04/15/18 0601  Weight: 217 lb (98.4 kg) 215 lb 3.2 oz (97.6 kg)    Hemodynamic parameters for last 24 hours:    Intake/Output from previous day: 05/06 0701 - 05/07 0700 In: 389.3 [P.O.:240; I.V.:149.3] Out: 950 [Urine:950] Intake/Output this shift: Total I/O In: 240 [P.O.:240] Out: -   Scheduled Meds: . [START ON 04/16/2018] heparin-papaverine-plasmalyte irrigation   Irrigation To OR  . isosorbide mononitrate  30 mg Oral Daily  . [START ON 04/16/2018] magnesium sulfate  40 mEq Other To OR  . metoprolol succinate  25 mg Oral Daily  . perflutren lipid microspheres (DEFINITY) IV suspension      . [START ON 04/16/2018] potassium chloride  80 mEq Other To OR  . rosuvastatin  40 mg Oral q1800  . sodium chloride flush  3 mL Intravenous Q12H  . [START ON 04/16/2018] tranexamic acid  15 mg/kg Intravenous To OR  . [START ON 04/16/2018] tranexamic acid  2 mg/kg Intracatheter To OR   Continuous Infusions: . sodium chloride    . [START ON 04/16/2018] cefUROXime (ZINACEF)  IV    . [START ON 04/16/2018] cefUROXime (ZINACEF)  IV    . [START ON 04/16/2018] dexmedetomidine    . [START ON 04/16/2018] DOPamine    . [START ON  04/16/2018] epinephrine    . [START ON 04/16/2018] heparin 30,000 units/NS 1000 mL solution for CELLSAVER    . heparin 1,250 Units/hr (04/14/18 2356)  . [START ON 04/16/2018] insulin (NOVOLIN-R) infusion    . [START ON 04/16/2018] milrinone    . [START ON 04/16/2018] nitroGLYCERIN    . [START ON 04/16/2018] phenylephrine 20mg /216mL NS (0.08mg /ml) infusion    . [START ON 04/16/2018] tranexamic acid (CYKLOKAPRON) infusion ([facility])    . [START ON 04/16/2018] vancomycin     PRN Meds:.sodium chloride, acetaminophen, ondansetron (ZOFRAN) IV, sodium chloride flush  General appearance: alert and cooperative Neurologic: intact Heart: regular rate and rhythm, S1, S2 normal, no murmur, click, rub or gallop Lungs: clear to auscultation bilaterally Abdomen: soft, non-tender; bowel sounds normal; no masses,  no organomegaly Extremities: extremities normal, atraumatic, no cyanosis or edema  Lab Results: CBC: Recent Labs    04/15/18 0610  WBC 8.0  HGB 12.6*  HCT 39.2  PLT 235   BMET:  Recent Labs    04/15/18 0610  NA 139  K 4.2  CL  107  CO2 26  GLUCOSE 107*  BUN 12  CREATININE 1.33*  CALCIUM 8.7*    PT/INR:  Recent Labs    04/15/18 0610  LABPROT 14.7  INR 1.16   Chronic Kidney Disease   Stage I     GFR >90  Stage II    GFR 60-89  Stage IIIA GFR 45-59  Stage IIIB GFR 30-44  Stage IV   GFR 15-29  Stage V    GFR  <15  Lab Results  Component Value Date   CREATININE 1.33 (H) 04/15/2018   Estimated Creatinine Clearance: 60.2 mL/min (A) (by C-G formula based on SCr of 1.33 mg/dL (H)).   Radiology Dg Chest 2 View  Result Date: 03/25/2018 CLINICAL DATA:  Chest pain EXAM: CHEST - 2 VIEW COMPARISON:  02/04/2018 FINDINGS: Anterior cervical fusion. Normal cardiac silhouette. No effusion, infiltrate pneumothorax. subtle linear interstitial markings extend to the peripheral lung. No acute osseous abnormality. IMPRESSION: Mild interstitial edema pattern. Electronically Signed   By: Suzy Bouchard  M.D.   On: 03/25/2018 21:10   Assessment/Plan: S/P Procedure(s) (LRB): LEFT HEART CATH AND CORONARY ANGIOGRAPHY (N/A) CABG in am  The goals risks and alternatives of the planned surgical procedure CABG  have been discussed with the patient in detail. The risks of the procedure including death, infection, stroke, myocardial infarction, bleeding, blood transfusion have all been discussed specifically.  I have quoted Albert Peterson a 2 % of perioperative mortality and a complication rate as high as 40 %. The patient's questions have been answered.Albert Peterson is willing  to proceed with the planned procedure.  Grace Isaac MD 04/15/2018 3:40 PM

## 2018-04-15 NOTE — Progress Notes (Signed)
Pt has been moving around room without CP. Discussed sternal precautions, IS (2000 mL), mobility post op and d/c planning with pt and wife. Good reception. Gave materials to review. Wife will be with him at d/c. Encouraged walking today, IS.  [phone] Yves Dill CES, ACSM 11:49 AM 04/15/2018

## 2018-04-16 ENCOUNTER — Inpatient Hospital Stay (HOSPITAL_COMMUNITY): Payer: Medicare Other

## 2018-04-16 ENCOUNTER — Inpatient Hospital Stay (HOSPITAL_COMMUNITY): Payer: Medicare Other | Admitting: Anesthesiology

## 2018-04-16 ENCOUNTER — Encounter (HOSPITAL_COMMUNITY): Payer: Self-pay

## 2018-04-16 ENCOUNTER — Encounter (HOSPITAL_COMMUNITY)
Admission: RE | Disposition: A | Discharge status: Home Health | Payer: Self-pay | Source: Ambulatory Visit | Attending: Cardiothoracic Surgery

## 2018-04-16 DIAGNOSIS — Z951 Presence of aortocoronary bypass graft: Secondary | ICD-10-CM

## 2018-04-16 DIAGNOSIS — I2510 Atherosclerotic heart disease of native coronary artery without angina pectoris: Secondary | ICD-10-CM | POA: Diagnosis present

## 2018-04-16 DIAGNOSIS — I208 Other forms of angina pectoris: Secondary | ICD-10-CM

## 2018-04-16 HISTORY — PX: CORONARY ARTERY BYPASS GRAFT: SHX141

## 2018-04-16 HISTORY — PX: TEE WITHOUT CARDIOVERSION: SHX5443

## 2018-04-16 LAB — POCT I-STAT 3, ART BLOOD GAS (G3+)
ACID-BASE DEFICIT: 5 mmol/L — AB (ref 0.0–2.0)
ACID-BASE DEFICIT: 4 mmol/L — AB (ref 0.0–2.0)
ACID-BASE EXCESS: 1 mmol/L (ref 0.0–2.0)
Acid-Base Excess: 1 mmol/L (ref 0.0–2.0)
Acid-Base Excess: 1 mmol/L (ref 0.0–2.0)
Acid-base deficit: 2 mmol/L (ref 0.0–2.0)
Acid-base deficit: 4 mmol/L — ABNORMAL HIGH (ref 0.0–2.0)
BICARBONATE: 22.9 mmol/L (ref 20.0–28.0)
Bicarbonate: 24.8 mmol/L (ref 20.0–28.0)
Bicarbonate: 22.5 mmol/L (ref 20.0–28.0)
Bicarbonate: 23.1 mmol/L (ref 20.0–28.0)
Bicarbonate: 21.9 mmol/L (ref 20.0–28.0)
Bicarbonate: 26.8 mmol/L (ref 20.0–28.0)
Bicarbonate: 26.1 mmol/L (ref 20.0–28.0)
O2 SAT: 100 %
O2 SAT: 96 %
O2 SAT: 92 %
O2 Saturation: 99 %
O2 Saturation: 95 %
O2 Saturation: 96 %
O2 Saturation: 96 %
PCO2 ART: 47.3 mmHg (ref 32.0–48.0)
PCO2 ART: 45.3 mmHg (ref 32.0–48.0)
PCO2 ART: 44.3 mmHg (ref 32.0–48.0)
PH ART: 7.393 (ref 7.350–7.450)
PH ART: 7.282 — AB (ref 7.350–7.450)
PH ART: 7.311 — AB (ref 7.350–7.450)
PH ART: 7.378 (ref 7.350–7.450)
PO2 ART: 79 mmHg — AB (ref 83.0–108.0)
TCO2: 26 mmol/L (ref 22–32)
TCO2: 24 mmol/L (ref 22–32)
TCO2: 24 mmol/L (ref 22–32)
TCO2: 24 mmol/L (ref 22–32)
TCO2: 23 mmol/L (ref 22–32)
TCO2: 28 mmol/L (ref 22–32)
TCO2: 27 mmol/L (ref 22–32)
pCO2 arterial: 35.4 mmHg (ref 32.0–48.0)
pCO2 arterial: 36.9 mmHg (ref 32.0–48.0)
pCO2 arterial: 44.7 mmHg (ref 32.0–48.0)
pCO2 arterial: 45.8 mmHg (ref 32.0–48.0)
pH, Arterial: 7.453 — ABNORMAL HIGH (ref 7.350–7.450)
pH, Arterial: 7.299 — ABNORMAL LOW (ref 7.350–7.450)
pH, Arterial: 7.381 (ref 7.350–7.450)
pO2, Arterial: 442 mmHg — ABNORMAL HIGH (ref 83.0–108.0)
pO2, Arterial: 154 mmHg — ABNORMAL HIGH (ref 83.0–108.0)
pO2, Arterial: 87 mmHg (ref 83.0–108.0)
pO2, Arterial: 91 mmHg (ref 83.0–108.0)
pO2, Arterial: 85 mmHg (ref 83.0–108.0)
pO2, Arterial: 70 mmHg — ABNORMAL LOW (ref 83.0–108.0)

## 2018-04-16 LAB — POCT I-STAT, CHEM 8
BUN: 12 mg/dL (ref 6–20)
BUN: 11 mg/dL (ref 6–20)
BUN: 9 mg/dL (ref 6–20)
BUN: 10 mg/dL (ref 6–20)
BUN: 10 mg/dL (ref 6–20)
BUN: 12 mg/dL (ref 6–20)
CALCIUM ION: 1.26 mmol/L (ref 1.15–1.40)
CALCIUM ION: 0.98 mmol/L — AB (ref 1.15–1.40)
CALCIUM ION: 1.21 mmol/L (ref 1.15–1.40)
CHLORIDE: 104 mmol/L (ref 101–111)
CHLORIDE: 105 mmol/L (ref 101–111)
CREATININE: 1 mg/dL (ref 0.61–1.24)
CREATININE: 0.7 mg/dL (ref 0.61–1.24)
CREATININE: 0.9 mg/dL (ref 0.61–1.24)
CREATININE: 0.8 mg/dL (ref 0.61–1.24)
Calcium, Ion: 1.04 mmol/L — ABNORMAL LOW (ref 1.15–1.40)
Calcium, Ion: 1.05 mmol/L — ABNORMAL LOW (ref 1.15–1.40)
Calcium, Ion: 1.1 mmol/L — ABNORMAL LOW (ref 1.15–1.40)
Chloride: 101 mmol/L (ref 101–111)
Chloride: 100 mmol/L — ABNORMAL LOW (ref 101–111)
Chloride: 102 mmol/L (ref 101–111)
Chloride: 105 mmol/L (ref 101–111)
Creatinine, Ser: 0.9 mg/dL (ref 0.61–1.24)
Creatinine, Ser: 0.9 mg/dL (ref 0.61–1.24)
GLUCOSE: 104 mg/dL — AB (ref 65–99)
GLUCOSE: 119 mg/dL — AB (ref 65–99)
GLUCOSE: 134 mg/dL — AB (ref 65–99)
GLUCOSE: 150 mg/dL — AB (ref 65–99)
Glucose, Bld: 84 mg/dL (ref 65–99)
Glucose, Bld: 96 mg/dL (ref 65–99)
HCT: 36 % — ABNORMAL LOW (ref 39.0–52.0)
HCT: 26 % — ABNORMAL LOW (ref 39.0–52.0)
HCT: 31 % — ABNORMAL LOW (ref 39.0–52.0)
HCT: 24 % — ABNORMAL LOW (ref 39.0–52.0)
HCT: 26 % — ABNORMAL LOW (ref 39.0–52.0)
HEMATOCRIT: 37 % — AB (ref 39.0–52.0)
HEMOGLOBIN: 8.2 g/dL — AB (ref 13.0–17.0)
HEMOGLOBIN: 8.8 g/dL — AB (ref 13.0–17.0)
HEMOGLOBIN: 12.6 g/dL — AB (ref 13.0–17.0)
Hemoglobin: 12.2 g/dL — ABNORMAL LOW (ref 13.0–17.0)
Hemoglobin: 10.5 g/dL — ABNORMAL LOW (ref 13.0–17.0)
Hemoglobin: 8.8 g/dL — ABNORMAL LOW (ref 13.0–17.0)
POTASSIUM: 4.1 mmol/L (ref 3.5–5.1)
POTASSIUM: 4.4 mmol/L (ref 3.5–5.1)
Potassium: 4.1 mmol/L (ref 3.5–5.1)
Potassium: 4.4 mmol/L (ref 3.5–5.1)
Potassium: 4.6 mmol/L (ref 3.5–5.1)
Potassium: 4.2 mmol/L (ref 3.5–5.1)
SODIUM: 139 mmol/L (ref 135–145)
SODIUM: 140 mmol/L (ref 135–145)
Sodium: 139 mmol/L (ref 135–145)
Sodium: 134 mmol/L — ABNORMAL LOW (ref 135–145)
Sodium: 138 mmol/L (ref 135–145)
Sodium: 142 mmol/L (ref 135–145)
TCO2: 27 mmol/L (ref 22–32)
TCO2: 24 mmol/L (ref 22–32)
TCO2: 25 mmol/L (ref 22–32)
TCO2: 25 mmol/L (ref 22–32)
TCO2: 22 mmol/L (ref 22–32)
TCO2: 25 mmol/L (ref 22–32)

## 2018-04-16 LAB — CBC
HCT: 41 % (ref 39.0–52.0)
HCT: 37.4 % — ABNORMAL LOW (ref 39.0–52.0)
HCT: 39.5 % (ref 39.0–52.0)
HEMOGLOBIN: 12.4 g/dL — AB (ref 13.0–17.0)
Hemoglobin: 13.4 g/dL (ref 13.0–17.0)
Hemoglobin: 13 g/dL (ref 13.0–17.0)
MCH: 29.5 pg (ref 26.0–34.0)
MCH: 29.6 pg (ref 26.0–34.0)
MCH: 29.5 pg (ref 26.0–34.0)
MCHC: 32.7 g/dL (ref 30.0–36.0)
MCHC: 33.2 g/dL (ref 30.0–36.0)
MCHC: 32.9 g/dL (ref 30.0–36.0)
MCV: 90.1 fL (ref 78.0–100.0)
MCV: 89.3 fL (ref 78.0–100.0)
MCV: 89.8 fL (ref 78.0–100.0)
Platelets: 239 10*3/uL (ref 150–400)
Platelets: 162 10*3/uL (ref 150–400)
Platelets: 210 10*3/uL (ref 150–400)
RBC: 4.55 MIL/uL (ref 4.22–5.81)
RBC: 4.19 MIL/uL — AB (ref 4.22–5.81)
RBC: 4.4 MIL/uL (ref 4.22–5.81)
RDW: 12.1 % (ref 11.5–15.5)
RDW: 12.1 % (ref 11.5–15.5)
RDW: 12.1 % (ref 11.5–15.5)
WBC: 8.1 10*3/uL (ref 4.0–10.5)
WBC: 17.1 10*3/uL — ABNORMAL HIGH (ref 4.0–10.5)
WBC: 17.8 10*3/uL — ABNORMAL HIGH (ref 4.0–10.5)

## 2018-04-16 LAB — HEMOGLOBIN AND HEMATOCRIT, BLOOD
HCT: 26.3 % — ABNORMAL LOW (ref 39.0–52.0)
Hemoglobin: 8.9 g/dL — ABNORMAL LOW (ref 13.0–17.0)

## 2018-04-16 LAB — POCT I-STAT 4, (NA,K, GLUC, HGB,HCT)
Glucose, Bld: 132 mg/dL — ABNORMAL HIGH (ref 65–99)
HCT: 36 % — ABNORMAL LOW (ref 39.0–52.0)
Hemoglobin: 12.2 g/dL — ABNORMAL LOW (ref 13.0–17.0)
Potassium: 3.9 mmol/L (ref 3.5–5.1)
Sodium: 140 mmol/L (ref 135–145)

## 2018-04-16 LAB — GLUCOSE, CAPILLARY
GLUCOSE-CAPILLARY: 143 mg/dL — AB (ref 65–99)
GLUCOSE-CAPILLARY: 140 mg/dL — AB (ref 65–99)
GLUCOSE-CAPILLARY: 157 mg/dL — AB (ref 65–99)
Glucose-Capillary: 130 mg/dL — ABNORMAL HIGH (ref 65–99)
Glucose-Capillary: 140 mg/dL — ABNORMAL HIGH (ref 65–99)
Glucose-Capillary: 148 mg/dL — ABNORMAL HIGH (ref 65–99)
Glucose-Capillary: 152 mg/dL — ABNORMAL HIGH (ref 65–99)

## 2018-04-16 LAB — CREATININE, SERUM
Creatinine, Ser: 1.14 mg/dL (ref 0.61–1.24)
GFR calc Af Amer: >60 mL/min (ref >60)
GFR calc non Af Amer: >60 mL/min (ref >60)

## 2018-04-16 LAB — BASIC METABOLIC PANEL
Anion gap: 8 (ref 5–15)
BUN: 12 mg/dL (ref 6–20)
CO2: 23 mmol/L (ref 22–32)
Calcium: 8.9 mg/dL (ref 8.9–10.3)
Chloride: 107 mmol/L (ref 101–111)
Creatinine, Ser: 1.22 mg/dL (ref 0.61–1.24)
GFR calc Af Amer: >60 mL/min (ref >60)
GFR calc non Af Amer: 59 mL/min — ABNORMAL LOW (ref >60)
Glucose, Bld: 108 mg/dL — ABNORMAL HIGH (ref 65–99)
Potassium: 4.1 mmol/L (ref 3.5–5.1)
Sodium: 138 mmol/L (ref 135–145)

## 2018-04-16 LAB — PLATELET COUNT: Platelets: 149 10*3/uL — ABNORMAL LOW (ref 150–400)

## 2018-04-16 LAB — PROTIME-INR
INR: 1.48
PROTHROMBIN TIME: 17.8 s — AB (ref 11.4–15.2)

## 2018-04-16 LAB — SURGICAL PCR SCREEN
MRSA, PCR: NEGATIVE
Staphylococcus aureus: NEGATIVE

## 2018-04-16 LAB — MAGNESIUM: Magnesium: 3.2 mg/dL — ABNORMAL HIGH (ref 1.7–2.4)

## 2018-04-16 LAB — APTT: aPTT: 31 seconds (ref 24–36)

## 2018-04-16 SURGERY — CORONARY ARTERY BYPASS GRAFTING (CABG)
Anesthesia: General | Site: Chest

## 2018-04-16 MED ORDER — DOPAMINE-DEXTROSE 3.2-5 MG/ML-% IV SOLN
0.0000 ug/kg/min | INTRAVENOUS | Status: DC
  Administered 2018-04-18: 3 ug/kg/min via INTRAVENOUS
  Filled 2018-04-16: qty 250

## 2018-04-16 MED ORDER — SODIUM CHLORIDE 0.9% FLUSH: 10.0000 mL | INTRAVENOUS | Status: DC | PRN

## 2018-04-16 MED ORDER — ACETAMINOPHEN 500 MG PO TABS
1000.0000 mg | ORAL_TABLET | Freq: Four times a day (QID) | ORAL | Status: AC
  Administered 2018-04-16 – 2018-04-20 (×10): 1000 mg via ORAL
  Filled 2018-04-16 (×11): qty 2

## 2018-04-16 MED ORDER — SODIUM CHLORIDE 0.45 % IV SOLN
INTRAVENOUS | Status: DC | PRN
  Administered 2018-04-16: 15:00:00 via INTRAVENOUS

## 2018-04-16 MED ORDER — DOCUSATE SODIUM 100 MG PO CAPS
200.0000 mg | ORAL_CAPSULE | Freq: Every day | ORAL | Status: DC
  Administered 2018-04-17 – 2018-04-22 (×4): 200 mg via ORAL
  Filled 2018-04-16 (×4): qty 2

## 2018-04-16 MED ORDER — CHLORHEXIDINE GLUCONATE 0.12 % MT SOLN
15.0000 mL | OROMUCOSAL | Status: AC
  Administered 2018-04-16: 15 mL via OROMUCOSAL

## 2018-04-16 MED ORDER — 0.9 % SODIUM CHLORIDE (POUR BTL) OPTIME
TOPICAL | Status: DC | PRN
  Administered 2018-04-16: 6000 mL

## 2018-04-16 MED ORDER — VANCOMYCIN HCL IN DEXTROSE 1-5 GM/200ML-% IV SOLN
1000.0000 mg | Freq: Once | INTRAVENOUS | Status: AC
  Administered 2018-04-16: 1000 mg via INTRAVENOUS
  Filled 2018-04-16: qty 200

## 2018-04-16 MED ORDER — ACETAMINOPHEN 160 MG/5ML PO SOLN: 650.0000 mg | Freq: Once | ORAL | Status: AC

## 2018-04-16 MED ORDER — ASPIRIN EC 325 MG PO TBEC
325.0000 mg | DELAYED_RELEASE_TABLET | Freq: Every day | ORAL | Status: DC
  Administered 2018-04-17 – 2018-04-22 (×6): 325 mg via ORAL
  Filled 2018-04-16 (×6): qty 1

## 2018-04-16 MED ORDER — VECURONIUM BROMIDE 10 MG IV SOLR
INTRAVENOUS | Status: DC | PRN
  Administered 2018-04-16 (×3): 10 mg via INTRAVENOUS

## 2018-04-16 MED ORDER — PHENYLEPHRINE HCL 10 MG/ML IJ SOLN
INTRAVENOUS | Status: DC | PRN
  Administered 2018-04-16: 20 ug/min via INTRAVENOUS

## 2018-04-16 MED ORDER — METOPROLOL TARTRATE 12.5 MG HALF TABLET
12.5000 mg | ORAL_TABLET | Freq: Two times a day (BID) | ORAL | Status: DC
  Administered 2018-04-18 – 2018-04-22 (×8): 12.5 mg via ORAL
  Filled 2018-04-16 (×10): qty 1

## 2018-04-16 MED ORDER — OXYCODONE HCL 5 MG PO TABS
5.0000 mg | ORAL_TABLET | ORAL | Status: DC | PRN
  Administered 2018-04-17: 10 mg via ORAL
  Administered 2018-04-17: 5 mg via ORAL
  Filled 2018-04-16: qty 1
  Filled 2018-04-16: qty 2

## 2018-04-16 MED ORDER — DIPHENHYDRAMINE HCL 50 MG/ML IJ SOLN
25.0000 mg | Freq: Once | INTRAMUSCULAR | Status: AC
  Administered 2018-04-16: 25 mg via INTRAVENOUS

## 2018-04-16 MED ORDER — SODIUM BICARBONATE 8.4 % IV SOLN
25.0000 meq | Freq: Once | INTRAVENOUS | Status: AC
  Administered 2018-04-16: 25 meq via INTRAVENOUS

## 2018-04-16 MED ORDER — DEXMEDETOMIDINE HCL IN NACL 200 MCG/50ML IV SOLN
0.0000 ug/kg/h | INTRAVENOUS | Status: DC
  Administered 2018-04-16: 0.3 ug/kg/h via INTRAVENOUS
  Filled 2018-04-16: qty 50

## 2018-04-16 MED ORDER — EPHEDRINE SULFATE 50 MG/ML IJ SOLN
INTRAMUSCULAR | Status: DC | PRN
  Administered 2018-04-16: 5 mg via INTRAVENOUS
  Administered 2018-04-16: 10 mg via INTRAVENOUS
  Administered 2018-04-16 (×2): 5 mg via INTRAVENOUS

## 2018-04-16 MED ORDER — FENTANYL CITRATE (PF) 250 MCG/5ML IJ SOLN
INTRAMUSCULAR | Status: AC
  Filled 2018-04-16: qty 25

## 2018-04-16 MED ORDER — ONDANSETRON HCL 4 MG/2ML IJ SOLN
4.0000 mg | Freq: Four times a day (QID) | INTRAMUSCULAR | Status: DC | PRN
  Administered 2018-04-17 – 2018-04-18 (×3): 4 mg via INTRAVENOUS
  Filled 2018-04-16 (×3): qty 2

## 2018-04-16 MED ORDER — MAGNESIUM SULFATE 4 GM/100ML IV SOLN
4.0000 g | Freq: Once | INTRAVENOUS | Status: AC
  Administered 2018-04-16: 4 g via INTRAVENOUS
  Filled 2018-04-16: qty 100

## 2018-04-16 MED ORDER — FENTANYL CITRATE (PF) 100 MCG/2ML IJ SOLN
INTRAMUSCULAR | Status: DC | PRN
  Administered 2018-04-16: 50 ug via INTRAVENOUS
  Administered 2018-04-16: 100 ug via INTRAVENOUS
  Administered 2018-04-16: 50 ug via INTRAVENOUS
  Administered 2018-04-16: 150 ug via INTRAVENOUS
  Administered 2018-04-16 (×2): 50 ug via INTRAVENOUS
  Administered 2018-04-16: 200 ug via INTRAVENOUS
  Administered 2018-04-16: 50 ug via INTRAVENOUS
  Administered 2018-04-16: 100 ug via INTRAVENOUS
  Administered 2018-04-16 (×3): 50 ug via INTRAVENOUS
  Administered 2018-04-16: 150 ug via INTRAVENOUS
  Administered 2018-04-16: 50 ug via INTRAVENOUS
  Administered 2018-04-16: 200 ug via INTRAVENOUS
  Administered 2018-04-16: 100 ug via INTRAVENOUS
  Administered 2018-04-16: 50 ug via INTRAVENOUS

## 2018-04-16 MED ORDER — MIDAZOLAM HCL 5 MG/5ML IJ SOLN
INTRAMUSCULAR | Status: DC | PRN
  Administered 2018-04-16: 3 mg via INTRAVENOUS
  Administered 2018-04-16 (×2): 1 mg via INTRAVENOUS
  Administered 2018-04-16: 2 mg via INTRAVENOUS
  Administered 2018-04-16: 1 mg via INTRAVENOUS
  Administered 2018-04-16: 2 mg via INTRAVENOUS

## 2018-04-16 MED ORDER — MIDAZOLAM HCL 10 MG/2ML IJ SOLN
INTRAMUSCULAR | Status: AC
  Filled 2018-04-16: qty 2

## 2018-04-16 MED ORDER — ROCURONIUM BROMIDE 10 MG/ML (PF) SYRINGE
PREFILLED_SYRINGE | INTRAVENOUS | Status: DC | PRN
  Administered 2018-04-16: 30 mg via INTRAVENOUS

## 2018-04-16 MED ORDER — HEPARIN SODIUM (PORCINE) 1000 UNIT/ML IJ SOLN
INTRAMUSCULAR | Status: AC
  Filled 2018-04-16: qty 1

## 2018-04-16 MED ORDER — SODIUM CHLORIDE 0.9 % IJ SOLN
OROMUCOSAL | Status: DC | PRN
  Administered 2018-04-16 (×3): via TOPICAL

## 2018-04-16 MED ORDER — ORAL CARE MOUTH RINSE
15.0000 mL | Freq: Four times a day (QID) | OROMUCOSAL | Status: DC
  Administered 2018-04-16: 15 mL via OROMUCOSAL

## 2018-04-16 MED ORDER — ALBUMIN HUMAN 5 % IV SOLN
INTRAVENOUS | Status: DC | PRN
  Administered 2018-04-16: 15:00:00 via INTRAVENOUS

## 2018-04-16 MED ORDER — NITROGLYCERIN IN D5W 200-5 MCG/ML-% IV SOLN: 0.0000 ug/min | INTRAVENOUS | Status: DC

## 2018-04-16 MED ORDER — ALBUMIN HUMAN 5 % IV SOLN
250.0000 mL | INTRAVENOUS | Status: DC | PRN
  Administered 2018-04-16 (×3): 250 mL via INTRAVENOUS
  Filled 2018-04-16: qty 250

## 2018-04-16 MED ORDER — LACTATED RINGERS IV SOLN
INTRAVENOUS | Status: DC
  Administered 2018-04-16: 16:00:00 via INTRAVENOUS

## 2018-04-16 MED ORDER — ARTIFICIAL TEARS OPHTHALMIC OINT
TOPICAL_OINTMENT | OPHTHALMIC | Status: DC | PRN
  Administered 2018-04-16: 1 via OPHTHALMIC

## 2018-04-16 MED ORDER — SODIUM CHLORIDE 0.9 % IV SOLN
INTRAVENOUS | Status: DC
  Administered 2018-04-16: 16:00:00 via INTRAVENOUS

## 2018-04-16 MED ORDER — EPINEPHRINE PF 1 MG/ML IJ SOLN
0.5000 ug/min | INTRAMUSCULAR | Status: DC
  Filled 2018-04-16: qty 4

## 2018-04-16 MED ORDER — SODIUM CHLORIDE 0.9 % IV SOLN: 250.0000 mL | INTRAVENOUS | Status: DC

## 2018-04-16 MED ORDER — SODIUM CHLORIDE 0.9% FLUSH
3.0000 mL | Freq: Two times a day (BID) | INTRAVENOUS | Status: DC
  Administered 2018-04-17 – 2018-04-18 (×2): 3 mL via INTRAVENOUS
  Administered 2018-04-18: 10 mL via INTRAVENOUS
  Administered 2018-04-19: 6 mL via INTRAVENOUS
  Administered 2018-04-22: 3 mL via INTRAVENOUS

## 2018-04-16 MED ORDER — PHENYLEPHRINE HCL 10 MG/ML IJ SOLN
INTRAVENOUS | Status: DC | PRN
  Administered 2018-04-16: 25 ug/min via INTRAVENOUS

## 2018-04-16 MED ORDER — SODIUM BICARBONATE 8.4 % IV SOLN
50.0000 meq | Freq: Once | INTRAVENOUS | Status: AC
  Administered 2018-04-16: 50 meq via INTRAVENOUS
  Filled 2018-04-16: qty 50

## 2018-04-16 MED ORDER — LACTATED RINGERS IV SOLN
INTRAVENOUS | Status: DC | PRN
  Administered 2018-04-16: 08:00:00 via INTRAVENOUS

## 2018-04-16 MED ORDER — POTASSIUM CHLORIDE 10 MEQ/50ML IV SOLN
10.0000 meq | INTRAVENOUS | Status: AC
  Administered 2018-04-16 (×3): 10 meq via INTRAVENOUS

## 2018-04-16 MED ORDER — PROTAMINE SULFATE 10 MG/ML IV SOLN
INTRAVENOUS | Status: AC
  Filled 2018-04-16: qty 15

## 2018-04-16 MED ORDER — MORPHINE SULFATE (PF) 2 MG/ML IV SOLN
1.0000 mg | INTRAVENOUS | Status: DC | PRN
  Administered 2018-04-16: 1 mg via INTRAVENOUS
  Filled 2018-04-16: qty 1

## 2018-04-16 MED ORDER — PHENYLEPHRINE 40 MCG/ML (10ML) SYRINGE FOR IV PUSH (FOR BLOOD PRESSURE SUPPORT)
PREFILLED_SYRINGE | INTRAVENOUS | Status: AC
  Filled 2018-04-16: qty 10

## 2018-04-16 MED ORDER — FAMOTIDINE IN NACL 20-0.9 MG/50ML-% IV SOLN
20.0000 mg | Freq: Two times a day (BID) | INTRAVENOUS | Status: DC
  Administered 2018-04-16: 20 mg via INTRAVENOUS

## 2018-04-16 MED ORDER — SODIUM CHLORIDE 0.9% FLUSH
10.0000 mL | Freq: Two times a day (BID) | INTRAVENOUS | Status: DC
  Administered 2018-04-16 – 2018-04-22 (×6): 10 mL

## 2018-04-16 MED ORDER — SODIUM CHLORIDE 0.9 % IV SOLN
INTRAVENOUS | Status: DC | PRN
  Administered 2018-04-16: .9 [IU]/h via INTRAVENOUS

## 2018-04-16 MED ORDER — ASPIRIN 81 MG PO CHEW
324.0000 mg | CHEWABLE_TABLET | Freq: Every day | ORAL | Status: DC
  Filled 2018-04-16: qty 4

## 2018-04-16 MED ORDER — HEMOSTATIC AGENTS (NO CHARGE) OPTIME
TOPICAL | Status: DC | PRN
  Administered 2018-04-16 (×2): 1 via TOPICAL

## 2018-04-16 MED ORDER — CHLORHEXIDINE GLUCONATE 0.12% ORAL RINSE (MEDLINE KIT)
15.0000 mL | Freq: Two times a day (BID) | OROMUCOSAL | Status: DC
  Administered 2018-04-16: 15 mL via OROMUCOSAL

## 2018-04-16 MED ORDER — SODIUM CHLORIDE 0.9 % IV SOLN
1.5000 g | Freq: Two times a day (BID) | INTRAVENOUS | Status: AC
  Administered 2018-04-16 – 2018-04-18 (×4): 1.5 g via INTRAVENOUS
  Filled 2018-04-16 (×4): qty 1.5

## 2018-04-16 MED ORDER — BISACODYL 5 MG PO TBEC
10.0000 mg | DELAYED_RELEASE_TABLET | Freq: Every day | ORAL | Status: DC
  Administered 2018-04-17 – 2018-04-19 (×3): 10 mg via ORAL
  Filled 2018-04-16 (×4): qty 2

## 2018-04-16 MED ORDER — DIPHENHYDRAMINE HCL 50 MG/ML IJ SOLN
INTRAMUSCULAR | Status: AC
  Administered 2018-04-16: 25 mg via INTRAVENOUS
  Filled 2018-04-16: qty 1

## 2018-04-16 MED ORDER — SODIUM CHLORIDE 0.9% FLUSH: 3.0000 mL | INTRAVENOUS | Status: DC | PRN

## 2018-04-16 MED ORDER — DEXMEDETOMIDINE HCL IN NACL 200 MCG/50ML IV SOLN
INTRAVENOUS | Status: DC | PRN
Start: 2018-04-16 — End: 2018-04-16
  Administered 2018-04-16: .3 ug/kg/h via INTRAVENOUS

## 2018-04-16 MED ORDER — PROTAMINE SULFATE 10 MG/ML IV SOLN
INTRAVENOUS | Status: DC | PRN
  Administered 2018-04-16: 300 mg via INTRAVENOUS

## 2018-04-16 MED ORDER — SODIUM CHLORIDE 0.9 % IV SOLN
0.0000 ug/min | INTRAVENOUS | Status: DC
  Filled 2018-04-16: qty 2

## 2018-04-16 MED ORDER — CHLORHEXIDINE GLUCONATE CLOTH 2 % EX PADS: 6.0000 | MEDICATED_PAD | Freq: Every day | CUTANEOUS | Status: DC

## 2018-04-16 MED ORDER — SODIUM CHLORIDE 0.9 % IV SOLN
INTRAVENOUS | Status: DC | PRN
  Administered 2018-04-16: 1.5 g via INTRAVENOUS

## 2018-04-16 MED ORDER — LACTATED RINGERS IV SOLN: INTRAVENOUS | Status: DC

## 2018-04-16 MED ORDER — MIDAZOLAM HCL 2 MG/2ML IJ SOLN: 2.0000 mg | INTRAMUSCULAR | Status: DC | PRN

## 2018-04-16 MED ORDER — ACETAMINOPHEN 650 MG RE SUPP
650.0000 mg | Freq: Once | RECTAL | Status: AC
  Administered 2018-04-16: 650 mg via RECTAL

## 2018-04-16 MED ORDER — ROCURONIUM BROMIDE 50 MG/5ML IV SOLN
INTRAVENOUS | Status: AC
  Filled 2018-04-16: qty 1

## 2018-04-16 MED ORDER — EPHEDRINE 5 MG/ML INJ
INTRAVENOUS | Status: AC
  Filled 2018-04-16: qty 10

## 2018-04-16 MED ORDER — PROPOFOL 10 MG/ML IV BOLUS
INTRAVENOUS | Status: DC | PRN
  Administered 2018-04-16: 10 mg via INTRAVENOUS
  Administered 2018-04-16: 120 mg via INTRAVENOUS

## 2018-04-16 MED ORDER — PANTOPRAZOLE SODIUM 40 MG PO TBEC
40.0000 mg | DELAYED_RELEASE_TABLET | Freq: Every day | ORAL | Status: DC
  Administered 2018-04-18 – 2018-04-22 (×5): 40 mg via ORAL
  Filled 2018-04-16 (×5): qty 1

## 2018-04-16 MED ORDER — METOPROLOL TARTRATE 5 MG/5ML IV SOLN: 2.5000 mg | INTRAVENOUS | Status: DC | PRN

## 2018-04-16 MED ORDER — SODIUM BICARBONATE 8.4 % IV SOLN
50.0000 meq | Freq: Once | INTRAVENOUS | Status: AC
Start: 2018-04-16 — End: 2018-04-16
  Administered 2018-04-16: 50 meq via INTRAVENOUS

## 2018-04-16 MED ORDER — METOPROLOL TARTRATE 25 MG/10 ML ORAL SUSPENSION
12.5000 mg | Freq: Two times a day (BID) | ORAL | Status: DC
  Filled 2018-04-16: qty 10

## 2018-04-16 MED ORDER — HEPARIN SODIUM (PORCINE) 1000 UNIT/ML IJ SOLN
INTRAMUSCULAR | Status: DC | PRN
  Administered 2018-04-16: 32000 [IU] via INTRAVENOUS
  Administered 2018-04-16: 5000 [IU] via INTRAVENOUS

## 2018-04-16 MED ORDER — PROPOFOL 10 MG/ML IV BOLUS
INTRAVENOUS | Status: AC
  Filled 2018-04-16: qty 20

## 2018-04-16 MED ORDER — SODIUM CHLORIDE 0.9 % IV SOLN
INTRAVENOUS | Status: DC
  Filled 2018-04-16: qty 1

## 2018-04-16 MED ORDER — DOPAMINE-DEXTROSE 3.2-5 MG/ML-% IV SOLN
INTRAVENOUS | Status: DC | PRN
  Administered 2018-04-16: 3 ug/kg/min via INTRAVENOUS

## 2018-04-16 MED ORDER — INSULIN REGULAR BOLUS VIA INFUSION
0.0000 [IU] | Freq: Three times a day (TID) | INTRAVENOUS | Status: DC
  Filled 2018-04-16: qty 10

## 2018-04-16 MED ORDER — CEFUROXIME SODIUM 750 MG IJ SOLR
INTRAMUSCULAR | Status: DC | PRN
  Administered 2018-04-16: 750 mg via INTRAVENOUS

## 2018-04-16 MED ORDER — VECURONIUM BROMIDE 10 MG IV SOLR
INTRAVENOUS | Status: AC
  Filled 2018-04-16: qty 30

## 2018-04-16 MED ORDER — PROTAMINE SULFATE 10 MG/ML IV SOLN
INTRAVENOUS | Status: AC
  Filled 2018-04-16: qty 25

## 2018-04-16 MED ORDER — BISACODYL 10 MG RE SUPP: 10.0000 mg | Freq: Every day | RECTAL | Status: DC

## 2018-04-16 MED ORDER — TRAMADOL HCL 50 MG PO TABS
50.0000 mg | ORAL_TABLET | ORAL | Status: DC | PRN
  Administered 2018-04-17: 50 mg via ORAL
  Administered 2018-04-17: 100 mg via ORAL
  Filled 2018-04-16: qty 1
  Filled 2018-04-16: qty 2

## 2018-04-16 MED ORDER — FENTANYL CITRATE (PF) 250 MCG/5ML IJ SOLN
INTRAMUSCULAR | Status: AC
  Filled 2018-04-16: qty 5

## 2018-04-16 MED ORDER — MORPHINE SULFATE (PF) 2 MG/ML IV SOLN
2.0000 mg | INTRAVENOUS | Status: DC | PRN
  Administered 2018-04-16 – 2018-04-17 (×7): 2 mg via INTRAVENOUS
  Filled 2018-04-16 (×7): qty 1

## 2018-04-16 MED ORDER — LACTATED RINGERS IV SOLN: 500.0000 mL | Freq: Once | INTRAVENOUS | Status: DC | PRN

## 2018-04-16 MED ORDER — ACETAMINOPHEN 160 MG/5ML PO SOLN: 1000.0000 mg | Freq: Four times a day (QID) | ORAL | Status: AC

## 2018-04-16 MED FILL — Heparin Sod (Porcine)-NaCl IV Soln 1000 Unit/500ML-0.9%: INTRAVENOUS | Qty: 1000 | Status: AC

## 2018-04-16 SURGICAL SUPPLY — 70 items
ADH SKN CLS APL DERMABOND .7 (GAUZE/BANDAGES/DRESSINGS) ×2
BAG DECANTER FOR FLEXI CONT (MISCELLANEOUS) ×3 IMPLANT
BANDAGE ACE 4X5 VEL STRL LF (GAUZE/BANDAGES/DRESSINGS) ×3 IMPLANT
BANDAGE ACE 6X5 VEL STRL LF (GAUZE/BANDAGES/DRESSINGS) ×3 IMPLANT
BLADE STERNUM SYSTEM 6 (BLADE) ×3 IMPLANT
BNDG GAUZE ELAST 4 BULKY (GAUZE/BANDAGES/DRESSINGS) ×3 IMPLANT
CANISTER SUCT 3000ML PPV (MISCELLANEOUS) ×3 IMPLANT
CATH CPB KIT GERHARDT (MISCELLANEOUS) ×3 IMPLANT
CATH THORACIC 28FR (CATHETERS) ×3 IMPLANT
CLIP VESOCCLUDE SM WIDE 24/CT (CLIP) ×2 IMPLANT
CONN ST 1/4X3/8  BEN (MISCELLANEOUS) ×1
CONN ST 1/4X3/8 BEN (MISCELLANEOUS) IMPLANT
CRADLE DONUT ADULT HEAD (MISCELLANEOUS) ×3 IMPLANT
DERMABOND ADVANCED (GAUZE/BANDAGES/DRESSINGS) ×1
DERMABOND ADVANCED .7 DNX12 (GAUZE/BANDAGES/DRESSINGS) IMPLANT
DRAIN CHANNEL 28F RND 3/8 FF (WOUND CARE) ×3 IMPLANT
DRAPE CARDIOVASCULAR INCISE (DRAPES) ×3
DRAPE SLUSH/WARMER DISC (DRAPES) ×3 IMPLANT
DRAPE SRG 135X102X78XABS (DRAPES) ×2 IMPLANT
DRSG AQUACEL AG ADV 3.5X14 (GAUZE/BANDAGES/DRESSINGS) ×3 IMPLANT
ELECT BLADE 4.0 EZ CLEAN MEGAD (MISCELLANEOUS) ×3
ELECT REM PT RETURN 9FT ADLT (ELECTROSURGICAL) ×6
ELECTRODE BLDE 4.0 EZ CLN MEGD (MISCELLANEOUS) ×2 IMPLANT
ELECTRODE REM PT RTRN 9FT ADLT (ELECTROSURGICAL) ×4 IMPLANT
FELT TEFLON 1X6 (MISCELLANEOUS) ×6 IMPLANT
GAUZE SPONGE 4X4 12PLY STRL (GAUZE/BANDAGES/DRESSINGS) ×6 IMPLANT
GAUZE SPONGE 4X4 12PLY STRL LF (GAUZE/BANDAGES/DRESSINGS) ×2 IMPLANT
GLOVE BIO SURGEON STRL SZ 6.5 (GLOVE) ×9 IMPLANT
GOWN STRL REUS W/ TWL LRG LVL3 (GOWN DISPOSABLE) ×8 IMPLANT
GOWN STRL REUS W/TWL LRG LVL3 (GOWN DISPOSABLE) ×12
HEMOSTAT POWDER SURGIFOAM 1G (HEMOSTASIS) ×9 IMPLANT
HEMOSTAT SURGICEL 2X14 (HEMOSTASIS) ×3 IMPLANT
KIT BASIN OR (CUSTOM PROCEDURE TRAY) ×3 IMPLANT
KIT CATH SUCT 8FR (CATHETERS) ×3 IMPLANT
KIT SUCTION CATH 14FR (SUCTIONS) ×6 IMPLANT
KIT TURNOVER KIT B (KITS) ×3 IMPLANT
KIT VASOVIEW HEMOPRO VH 3000 (KITS) ×3 IMPLANT
LEAD PACING MYOCARDI (MISCELLANEOUS) ×3 IMPLANT
MARKER GRAFT CORONARY BYPASS (MISCELLANEOUS) ×9 IMPLANT
NS IRRIG 1000ML POUR BTL (IV SOLUTION) ×15 IMPLANT
PACK E OPEN HEART (SUTURE) ×3 IMPLANT
PACK OPEN HEART (CUSTOM PROCEDURE TRAY) ×3 IMPLANT
PAD ARMBOARD 7.5X6 YLW CONV (MISCELLANEOUS) ×6 IMPLANT
PAD ELECT DEFIB RADIOL ZOLL (MISCELLANEOUS) ×3 IMPLANT
PENCIL BUTTON HOLSTER BLD 10FT (ELECTRODE) ×3 IMPLANT
SET CARDIOPLEGIA MPS 5001102 (MISCELLANEOUS) ×1 IMPLANT
SPONGE LAP 18X18 X RAY DECT (DISPOSABLE) ×2 IMPLANT
SUT BONE WAX W31G (SUTURE) ×3 IMPLANT
SUT MNCRL AB 4-0 PS2 18 (SUTURE) ×1 IMPLANT
SUT PROLENE 3 0 SH1 36 (SUTURE) ×3 IMPLANT
SUT PROLENE 4 0 TF (SUTURE) ×6 IMPLANT
SUT PROLENE 6 0 C 1 30 (SUTURE) ×1 IMPLANT
SUT PROLENE 6 0 CC (SUTURE) ×6 IMPLANT
SUT PROLENE 7 0 BV1 MDA (SUTURE) ×4 IMPLANT
SUT PROLENE 8 0 BV175 6 (SUTURE) ×3 IMPLANT
SUT PROLENE BLUE 7 0 (SUTURE) ×1 IMPLANT
SUT STEEL 6MS V (SUTURE) ×3 IMPLANT
SUT STEEL SZ 6 DBL 3X14 BALL (SUTURE) ×3 IMPLANT
SUT VIC AB 1 CTX 18 (SUTURE) ×6 IMPLANT
SUT VIC AB 2-0 CT1 27 (SUTURE) ×3
SUT VIC AB 2-0 CT1 TAPERPNT 27 (SUTURE) IMPLANT
SYSTEM SAHARA CHEST DRAIN ATS (WOUND CARE) ×3 IMPLANT
TAPE CLOTH SURG 4X10 WHT LF (GAUZE/BANDAGES/DRESSINGS) ×2 IMPLANT
TAPE PAPER 2X10 WHT MICROPORE (GAUZE/BANDAGES/DRESSINGS) ×1 IMPLANT
TOWEL GREEN STERILE (TOWEL DISPOSABLE) ×3 IMPLANT
TOWEL GREEN STERILE FF (TOWEL DISPOSABLE) ×3 IMPLANT
TRAY FOLEY SILVER 16FR TEMP (SET/KITS/TRAYS/PACK) ×3 IMPLANT
TUBING INSUFFLATION (TUBING) ×3 IMPLANT
UNDERPAD 30X30 (UNDERPADS AND DIAPERS) ×3 IMPLANT
WATER STERILE IRR 1000ML POUR (IV SOLUTION) ×6 IMPLANT

## 2018-04-16 NOTE — Brief Op Note (Signed)
      [address]       [place] [address]             [phone]      04/16/2018  PATIENT:  Albert Peterson  67 y.o. male  PRE-OPERATIVE DIAGNOSIS:  CAD  POST-OPERATIVE DIAGNOSIS:  CAD  PROCEDURE:  TRANSESOPHAGEAL ECHOCARDIOGRAM (TEE), MEDIAN STERNOTOMY for CORONARY ARTERY BYPASS GRAFTING (CABG) x 4, (LIMA to LAD, SVG to DIAGONAL, SVG to CIRCUMFLEX, and SVG to PDA)ON PUMP, USING LEFT INTERNAL MAMMARY ARTERY AND RIGHT GREATER SAPHENOUS VEIN HARVESTED ENDOSCOPICALLY   SURGEON:  Surgeon(s) and Role:    Grace Isaac, MD - Primary  PHYSICIAN ASSISTANT: Lars Pinks PA-C  ASSISTANTS: Alyssa Hegerty RNFA  ANESTHESIA:   general  EBL:  1100 mL   DRAINS: Chest tubes placed in the mediastinal and pleural spaces   COUNTS CORRECT:  YES  DICTATION: .Dragon Dictation  PLAN OF CARE: Admit to inpatient   PATIENT DISPOSITION:  ICU - intubated and hemodynamically stable.   Delay start of Pharmacological VTE agent (>24hrs) due to surgical blood loss or risk of bleeding: yes  BASELINE WEIGHT: 97.5 kg

## 2018-04-16 NOTE — Progress Notes (Addendum)
1530 ABG results called to MD Servando Snare, new orders received.   40 - MD Servando Snare paged to discuss patient's blood pressure and Pulmonary Artery Diastolic Pressures.  Neo-Synephrine turned off at 1545 and patient's blood pressure remains elevated with MAPs in the mid 90's.  MD Servando Snare advised okay to titrate Dopamine and turn off if hypertension continues and if hypertension persists after Dopamine off, okay to start nitroglycerin.  MD advised okay if PAD lower if other vital signs stable.

## 2018-04-16 NOTE — Transfer of Care (Signed)
Immediate Anesthesia Transfer of Care Note  Patient: Albert Peterson  Procedure(s) Performed: CORONARY ARTERY BYPASS GRAFTING (CABG) x 4, ON PUMP, LIMA to LAD, SVG to DIAGONAL, SVG to CIRCUMFLEX, and SVG to PDA, USING LEFT INTERNAL MAMMARY ARTERY AND RIGHT GREATER SAPHENOUS VEIN HARVESTED ENDOSCOPICALLY (N/A Chest) TRANSESOPHAGEAL ECHOCARDIOGRAM (TEE) (N/A )  Patient Location: SICU  Anesthesia Type:General  Level of Consciousness: Patient remains intubated per anesthesia plan  Airway & Oxygen Therapy: Patient remains intubated per anesthesia plan  Post-op Assessment: Report given to RN and Post -op Vital signs reviewed and stable  Post vital signs: Reviewed and stable  Last Vitals:  Vitals Value Taken Time  BP 90/58 04/16/2018  2:46 PM  Temp 35.5 C 04/16/2018  2:52 PM  Pulse 80 04/16/2018  2:52 PM  Resp 12 04/16/2018  2:52 PM  SpO2 95 % 04/16/2018  2:52 PM  Vitals shown include unvalidated device data.  Last Pain:  Vitals:   04/16/18 0538  TempSrc: Oral  PainSc:       Patients Stated Pain Goal: 3 (97/74/14 2395)  Complications: No apparent anesthesia complications

## 2018-04-16 NOTE — Anesthesia Postprocedure Evaluation (Signed)
Anesthesia Post Note  Patient: Albert Peterson  Procedure(s) Performed: CORONARY ARTERY BYPASS GRAFTING (CABG) x 4, ON PUMP, LIMA to LAD, SVG to DIAGONAL, SVG to CIRCUMFLEX, and SVG to PDA, USING LEFT INTERNAL MAMMARY ARTERY AND RIGHT GREATER SAPHENOUS VEIN HARVESTED ENDOSCOPICALLY (N/A Chest) TRANSESOPHAGEAL ECHOCARDIOGRAM (TEE) (N/A )     Patient location during evaluation: SICU Anesthesia Type: General Level of consciousness: sedated Pain management: pain level controlled Vital Signs Assessment: post-procedure vital signs reviewed and stable Respiratory status: patient remains intubated per anesthesia plan Cardiovascular status: stable Postop Assessment: no apparent nausea or vomiting Anesthetic complications: no    Last Vitals:  Vitals:   04/16/18 0538 04/16/18 1440  BP: (!) 158/84 (!) 99/48  Pulse: (!) 58 81  Resp: 20 13  Temp: 36.5 C   SpO2: 96% 95%    Last Pain:  Vitals:   04/16/18 0538  TempSrc: Oral  PainSc:                  Shoaib Siefker DANIEL

## 2018-04-16 NOTE — Procedures (Signed)
Extubation Procedure Note  Patient Details:   Name: Albert Peterson DOB: 26-May-1951 MRN: [id number]   Airway Documentation:    Vent end date: 04/16/18 Vent end time: 2105   Evaluation  O2 sats: stable throughout Complications: No apparent complications Patient did tolerate procedure well. Bilateral Breath Sounds: Diminished, Clear   Yes   Patient tolerated wean. VC 0.65 L. Patient unable to understand to close mouth with NIF. RT felt that was the problem so had patient practice multiple times after contacting MD. Patient achieved -24 and -26 on the NIF. Positive for cuff leak. Patient extubated to a 5 Lpm nasal cannula. No signs of dyspnea or stridor noted. Patient instructed on the Incentive Spirometer post extubation achieving 500 mL, four times and 650 mL at best once. RN at bedside. Patient resting comfortably.   Myrtie Neither 04/16/2018, 9:11 PM

## 2018-04-16 NOTE — Progress Notes (Signed)
      [address]       [place] [address]             [phone]     S/p CABG x 4  Intubated, waking up. Did not meet parameters on 1st attempt to extubate  BP 108/74   Pulse 90   Temp 99.1 F (37.3 C)   Resp (!) 26   Ht 5\' 8"  (1.727 m)   Wt 214 lb 14.4 oz (97.5 kg)   SpO2 99%   BMI 32.68 kg/m    Intake/Output Summary (Last 24 hours) at 04/16/2018 2001 Last data filed at 04/16/2018 1945 Gross per 24 hour  Intake 4289.72 ml  Output 4227 ml  Net 62.72 ml    Minimal CT output  Doing well early postop  Remo Lipps C. Roxan Hockey, MD [facility] [phone]

## 2018-04-16 NOTE — Progress Notes (Signed)
Follow up ABG results discussed with MD Gerhardt at bedside.  MD advised okay to begin rapid wean protocol.

## 2018-04-16 NOTE — Anesthesia Procedure Notes (Signed)
Central Venous Catheter Insertion Performed by: Duane Boston, MD, anesthesiologist Start/End5/07/2018 8:06 AM, 04/16/2018 8:16 AM Patient location: Pre-op. Preanesthetic checklist: patient identified, IV checked, site marked, risks and benefits discussed, surgical consent, monitors and equipment checked, pre-op evaluation, timeout performed and anesthesia consent Position: Trendelenburg Lidocaine 1% used for infiltration and patient sedated Hand hygiene performed , maximum sterile barriers used  and Seldinger technique used Catheter size: 8.5 Fr Total catheter length 8. PA cath was placed.Sheath introducer Swan type:thermodilution PA Cath depth:50 Procedure performed using ultrasound guided technique. Ultrasound Notes:anatomy identified, needle tip was noted to be adjacent to the nerve/plexus identified, no ultrasound evidence of intravascular and/or intraneural injection and image(s) printed for medical record Attempts: 1 Following insertion, line sutured and dressing applied. Post procedure assessment: free fluid flow, blood return through all ports and no air  Patient tolerated the procedure well with no immediate complications.

## 2018-04-16 NOTE — Progress Notes (Signed)
      [address]       [place] [address]             [phone]       Roanna Banning has been scheduled for Procedure(s): CORONARY ARTERY BYPASS GRAFTING (CABG) (N/A) TRANSESOPHAGEAL ECHOCARDIOGRAM (TEE) (N/A) today. The various methods of treatment have been discussed with the patient. After consideration of the risks, benefits and treatment options the patient has consented to the planned procedure.   The patient has been seen and labs reviewed. There are no changes in the patient's condition to prevent proceeding with the planned procedure today.  Recent labs:  Lab Results  Component Value Date   WBC 8.1 04/16/2018   HGB 13.4 04/16/2018   HCT 41.0 04/16/2018   PLT 239 04/16/2018   GLUCOSE 108 (H) 04/16/2018   ALT 17 04/15/2018   AST 14 (L) 04/15/2018   NA 138 04/16/2018   K 4.1 04/16/2018   CL 107 04/16/2018   CREATININE 1.22 04/16/2018   BUN 12 04/16/2018   CO2 23 04/16/2018   INR 1.16 04/15/2018   HGBA1C 5.9 (H) 04/15/2018   Dopplers: reviewed by Dr Trula Slade : no history of neurologic symptoms  Final Interpretation: Right Carotid: Velocities in the right ICA are consistent with a 40-59%        stenosis.  Left Carotid: Velocities in the left ICA are consistent with a 60-79% stenosis.       Stenosis may be underestimated due to movement of the neck and       swallowing due exan. Vertebrals: Bilateral vertebral arteries demonstrate antegrade flow. Subclavians: Normal flow hemodynamics were seen in bilateral subclavian       arteries.  Right ABI: ABIs are unreliable. TBIs are unreliable. Arteries were compressible. Left ABI: The left toe-brachial index is normal. ABIs are unreliable. Right Upper Extremity: Doppler waveforms remain within normal limits withradial compression. Doppler waveforms remain within normal limits with ulnar compression. Left Upper Extremity: Doppler waveforms remain within normal limits with  radial compression. Doppler waveforms remain within normal limits with unar compression.    Grace Isaac, MD 04/16/2018 7:34 AM

## 2018-04-16 NOTE — Anesthesia Procedure Notes (Signed)
Arterial Line Insertion Start/End5/07/2018 7:45 AM, 04/16/2018 7:53 AM Performed by: Glynda Jaeger, CRNA, CRNA  Patient location: Pre-op. Preanesthetic checklist: patient identified, IV checked, site marked, risks and benefits discussed, surgical consent, monitors and equipment checked, pre-op evaluation, timeout performed and anesthesia consent Lidocaine 1% used for infiltration Left, radial was placed Catheter size: 20 G Hand hygiene performed  and maximum sterile barriers used  Allen's test indicative of satisfactory collateral circulation Attempts: 1 Procedure performed without using ultrasound guided technique. Following insertion, dressing applied and Biopatch. Post procedure assessment: normal  Patient tolerated the procedure well with no immediate complications. Additional procedure comments: Performed by Richardson Chiquito with CRNA supervision.

## 2018-04-16 NOTE — Anesthesia Procedure Notes (Signed)
Procedure Name: Intubation Date/Time: 04/16/2018 8:48 AM Performed by: Glynda Jaeger, CRNA Pre-anesthesia Checklist: Patient identified, Emergency Drugs available, Suction available and Patient being monitored Patient Re-evaluated:Patient Re-evaluated prior to induction Oxygen Delivery Method: Circle System Utilized Preoxygenation: Pre-oxygenation with 100% oxygen Induction Type: IV induction Ventilation: Mask ventilation without difficulty Laryngoscope Size: Mac and 4 Grade View: Grade I Tube type: Oral Tube size: 8.0 mm Number of attempts: 1 Airway Equipment and Method: Stylet and Oral airway Placement Confirmation: ETT inserted through vocal cords under direct vision,  positive ETCO2,  breath sounds checked- equal and bilateral and CO2 detector Secured at: 23 cm Tube secured with: Tape Dental Injury: Teeth and Oropharynx as per pre-operative assessment  Comments: Intubated by Vickii Penna, SRNA

## 2018-04-16 NOTE — Progress Notes (Signed)
On patient's arrival to unit, patient hypotensive but good CO/CI.  MD Servando Snare to bedside and assessed patient and found generalized rash.  MD ordered Benadryl and advised to not use CHG wipes when patient bathed.

## 2018-04-16 NOTE — Progress Notes (Signed)
Pt failed vent mechanics, NIF -10 (best effort) x 5 attempts.  Pt placed back on full vent support per protocol.  RN aware.

## 2018-04-16 NOTE — Progress Notes (Signed)
SICU vent wean protocol initiated.   

## 2018-04-17 ENCOUNTER — Inpatient Hospital Stay (HOSPITAL_COMMUNITY): Payer: Medicare Other

## 2018-04-17 ENCOUNTER — Encounter (HOSPITAL_COMMUNITY): Payer: Self-pay | Admitting: Cardiothoracic Surgery

## 2018-04-17 LAB — CBC
HCT: 35.8 % — ABNORMAL LOW (ref 39.0–52.0)
HEMATOCRIT: 36.6 % — AB (ref 39.0–52.0)
HEMOGLOBIN: 11.9 g/dL — AB (ref 13.0–17.0)
Hemoglobin: 11.5 g/dL — ABNORMAL LOW (ref 13.0–17.0)
MCH: 29.2 pg (ref 26.0–34.0)
MCH: 29.7 pg (ref 26.0–34.0)
MCHC: 32.5 g/dL (ref 30.0–36.0)
MCHC: 32.1 g/dL (ref 30.0–36.0)
MCV: 89.9 fL (ref 78.0–100.0)
MCV: 92.5 fL (ref 78.0–100.0)
Platelets: 199 10*3/uL (ref 150–400)
Platelets: 184 10*3/uL (ref 150–400)
RBC: 4.07 MIL/uL — ABNORMAL LOW (ref 4.22–5.81)
RBC: 3.87 MIL/uL — ABNORMAL LOW (ref 4.22–5.81)
RDW: 12.3 % (ref 11.5–15.5)
RDW: 12.6 % (ref 11.5–15.5)
WBC: 15.7 10*3/uL — AB (ref 4.0–10.5)
WBC: 19.5 10*3/uL — ABNORMAL HIGH (ref 4.0–10.5)

## 2018-04-17 LAB — POCT I-STAT, CHEM 8
BUN: 18 mg/dL (ref 6–20)
Calcium, Ion: 1.19 mmol/L (ref 1.15–1.40)
Chloride: 104 mmol/L (ref 101–111)
Creatinine, Ser: 1.3 mg/dL — ABNORMAL HIGH (ref 0.61–1.24)
Glucose, Bld: 141 mg/dL — ABNORMAL HIGH (ref 65–99)
HCT: 33 % — ABNORMAL LOW (ref 39.0–52.0)
Hemoglobin: 11.2 g/dL — ABNORMAL LOW (ref 13.0–17.0)
Potassium: 4.1 mmol/L (ref 3.5–5.1)
Sodium: 141 mmol/L (ref 135–145)
TCO2: 26 mmol/L (ref 22–32)

## 2018-04-17 LAB — BASIC METABOLIC PANEL
Anion gap: 5 (ref 5–15)
BUN: 11 mg/dL (ref 6–20)
CHLORIDE: 109 mmol/L (ref 101–111)
CO2: 27 mmol/L (ref 22–32)
Calcium: 8.1 mg/dL — ABNORMAL LOW (ref 8.9–10.3)
Creatinine, Ser: 1.2 mg/dL (ref 0.61–1.24)
GFR calc Af Amer: >60 mL/min (ref >60)
GFR calc non Af Amer: >60 mL/min (ref >60)
GLUCOSE: 116 mg/dL — AB (ref 65–99)
POTASSIUM: 4.2 mmol/L (ref 3.5–5.1)
Sodium: 141 mmol/L (ref 135–145)

## 2018-04-17 LAB — GLUCOSE, CAPILLARY
GLUCOSE-CAPILLARY: 115 mg/dL — AB (ref 65–99)
GLUCOSE-CAPILLARY: 112 mg/dL — AB (ref 65–99)
GLUCOSE-CAPILLARY: 134 mg/dL — AB (ref 65–99)
Glucose-Capillary: 106 mg/dL — ABNORMAL HIGH (ref 65–99)
Glucose-Capillary: 106 mg/dL — ABNORMAL HIGH (ref 65–99)
Glucose-Capillary: 114 mg/dL — ABNORMAL HIGH (ref 65–99)
Glucose-Capillary: 116 mg/dL — ABNORMAL HIGH (ref 65–99)
Glucose-Capillary: 118 mg/dL — ABNORMAL HIGH (ref 65–99)
Glucose-Capillary: 120 mg/dL — ABNORMAL HIGH (ref 65–99)
Glucose-Capillary: 122 mg/dL — ABNORMAL HIGH (ref 65–99)
Glucose-Capillary: 126 mg/dL — ABNORMAL HIGH (ref 65–99)
Glucose-Capillary: 129 mg/dL — ABNORMAL HIGH (ref 65–99)
Glucose-Capillary: 134 mg/dL — ABNORMAL HIGH (ref 65–99)
Glucose-Capillary: 150 mg/dL — ABNORMAL HIGH (ref 65–99)
Glucose-Capillary: 86 mg/dL (ref 65–99)

## 2018-04-17 LAB — MAGNESIUM
MAGNESIUM: 2.5 mg/dL — AB (ref 1.7–2.4)
Magnesium: 2.6 mg/dL — ABNORMAL HIGH (ref 1.7–2.4)

## 2018-04-17 LAB — CREATININE, SERUM
CREATININE: 1.59 mg/dL — AB (ref 0.61–1.24)
GFR calc Af Amer: 50 mL/min — ABNORMAL LOW (ref >60)
GFR calc non Af Amer: 43 mL/min — ABNORMAL LOW (ref >60)

## 2018-04-17 MED ORDER — ENOXAPARIN SODIUM 30 MG/0.3ML ~~LOC~~ SOLN
30.0000 mg | Freq: Every day | SUBCUTANEOUS | Status: DC
  Administered 2018-04-17 – 2018-04-21 (×5): 30 mg via SUBCUTANEOUS
  Filled 2018-04-17 (×5): qty 0.3

## 2018-04-17 MED ORDER — INSULIN ASPART 100 UNIT/ML ~~LOC~~ SOLN
0.0000 [IU] | SUBCUTANEOUS | Status: DC
  Administered 2018-04-17 – 2018-04-18 (×3): 2 [IU] via SUBCUTANEOUS
  Administered 2018-04-18: 4 [IU] via SUBCUTANEOUS
  Administered 2018-04-18 – 2018-04-19 (×2): 2 [IU] via SUBCUTANEOUS

## 2018-04-17 MED ORDER — ORAL CARE MOUTH RINSE
15.0000 mL | Freq: Two times a day (BID) | OROMUCOSAL | Status: DC
  Administered 2018-04-17 – 2018-04-22 (×5): 15 mL via OROMUCOSAL

## 2018-04-17 MED ORDER — FUROSEMIDE 10 MG/ML IJ SOLN
40.0000 mg | Freq: Once | INTRAMUSCULAR | Status: AC
  Administered 2018-04-17: 40 mg via INTRAVENOUS

## 2018-04-17 MED FILL — Dexmedetomidine HCl in NaCl 0.9% IV Soln 400 MCG/100ML: INTRAVENOUS | Qty: 100 | Status: AC

## 2018-04-17 MED FILL — Heparin Sodium (Porcine) Inj 1000 Unit/ML: INTRAMUSCULAR | Qty: 30 | Status: AC

## 2018-04-17 MED FILL — Magnesium Sulfate Inj 50%: INTRAMUSCULAR | Qty: 10 | Status: AC

## 2018-04-17 MED FILL — Potassium Chloride Inj 2 mEq/ML: INTRAVENOUS | Qty: 40 | Status: AC

## 2018-04-17 NOTE — Progress Notes (Addendum)
T[facility] DAILY ICU PROGRESS NOTE                   [address]            [place] [address]          [phone]   1 Day Post-Op Procedure(s) (LRB): CORONARY ARTERY BYPASS GRAFTING (CABG) x 4, ON PUMP, LIMA to LAD, SVG to DIAGONAL, SVG to CIRCUMFLEX, and SVG to PDA, USING LEFT INTERNAL MAMMARY ARTERY AND RIGHT GREATER SAPHENOUS VEIN HARVESTED ENDOSCOPICALLY (N/A) TRANSESOPHAGEAL ECHOCARDIOGRAM (TEE) (N/A)  Total Length of Stay:  LOS: 3 days   Subjective: Feels pretty well, mild nausea, not much pain. Denies SOB  Objective: Vital signs in last 24 hours: Temp:  [95 F (35 C)-100 F (37.8 C)] 99 F (37.2 C) (05/09 0730) Pulse Rate:  [70-91] 70 (05/09 0730) Cardiac Rhythm: Atrial paced (05/09 0700) Resp:  [12-34] 15 (05/09 0730) BP: (90-135)/(48-85) 99/65 (05/09 0730) SpO2:  [90 %-99 %] 98 % (05/09 0730) Arterial Line BP: (72-257)/(47-252) 112/54 (05/09 0730) FiO2 (%):  [40 %-50 %] 40 % (05/08 2003) Weight:  [101.7 kg (224 lb 3.2 oz)] 101.7 kg (224 lb 3.2 oz) (05/09 0530)  Filed Weights   04/15/18 0601 04/16/18 0538 04/17/18 0530  Weight: 97.6 kg (215 lb 3.2 oz) 97.5 kg (214 lb 14.4 oz) 101.7 kg (224 lb 3.2 oz)    Weight change: 4.218 kg (9 lb 4.8 oz)   Hemodynamic parameters for last 24 hours: PAP: (16-54)/(5-30) 31/16 CO:  [5 L/min-8.5 L/min] 5 L/min CI:  [2.4 L/min/m2-4 L/min/m2] 2.4 L/min/m2  Intake/Output from previous day: 05/08 0701 - 05/09 0700 In: 4835.3 [I.V.:2815.3; Blood:770; IV Piggyback:1250] Out: 6063 [KZSWF:0932; Blood:1100; Chest Tube:217]  Intake/Output this shift: No intake/output data recorded.  Current Meds: Scheduled Meds: . acetaminophen  1,000 mg Oral Q6H   Or  . acetaminophen (TYLENOL) oral liquid 160 mg/5 mL  1,000 mg Per Tube Q6H  . aspirin EC  325 mg Oral Daily   Or  . aspirin  324 mg Per Tube Daily  . bisacodyl  10 mg Oral Daily   Or  . bisacodyl  10 mg Rectal Daily  . docusate sodium  200 mg Oral Daily  . insulin  regular  0-10 Units Intravenous TID WC  . mouth rinse  15 mL Mouth Rinse BID  . metoprolol tartrate  12.5 mg Oral BID   Or  . metoprolol tartrate  12.5 mg Per Tube BID  . [START ON 04/18/2018] pantoprazole  40 mg Oral Daily  . rosuvastatin  40 mg Oral q1800  . sodium chloride flush  10-40 mL Intracatheter Q12H  . sodium chloride flush  3 mL Intravenous Q12H   Continuous Infusions: . sodium chloride 20 mL/hr at 04/16/18 1500  . sodium chloride    . sodium chloride 20 mL/hr at 04/16/18 1602  . albumin human    . cefUROXime (ZINACEF)  IV Stopped (04/17/18 0446)  . dexmedetomidine (PRECEDEX) IV infusion Stopped (04/17/18 0500)  . DOPamine 3 mcg/kg/min (04/17/18 0700)  . epinephrine    . famotidine (PEPCID) IV Stopped (04/16/18 1537)  . insulin (NOVOLIN-R) infusion 2.8 Units/hr (04/17/18 0600)  . lactated ringers    . lactated ringers Stopped (04/17/18 0100)  . lactated ringers 20 mL/hr at 04/16/18 1627  . nitroGLYCERIN Stopped (04/16/18 1614)  . phenylephrine (NEO-SYNEPHRINE) Adult infusion 15 mcg/min (04/17/18 0700)   PRN Meds:.sodium chloride, albumin human, lactated ringers, metoprolol tartrate, midazolam, morphine injection, ondansetron (ZOFRAN)  IV, oxyCODONE, sodium chloride flush, sodium chloride flush, traMADol  General appearance: alert, cooperative and no distress Heart: regular rate and rhythm and no rub Lungs: dim in the bases Abdomen: benign Extremities: min edema Wound: dressings CDI  Lab Results: CBC: Recent Labs    04/16/18 2121 04/17/18 0420  WBC 17.8* 15.7*  HGB 13.0 11.9*  HCT 39.5 36.6*  PLT 210 199   BMET:  Recent Labs    04/16/18 0514  04/16/18 2035 04/16/18 2121 04/17/18 0420  NA 138   < > 142  --  141  K 4.1   < > 4.4  --  4.2  CL 107   < > 105  --  109  CO2 23  --   --   --  27  GLUCOSE 108*   < > 150*  --  116*  BUN 12   < > 12  --  11  CREATININE 1.22   < > 0.90 1.14 1.20  CALCIUM 8.9  --   --   --  8.1*   < > = values in this  interval not displayed.    CMET: Lab Results  Component Value Date   WBC 15.7 (H) 04/17/2018   HGB 11.9 (L) 04/17/2018   HCT 36.6 (L) 04/17/2018   PLT 199 04/17/2018   GLUCOSE 116 (H) 04/17/2018   ALT 17 04/15/2018   AST 14 (L) 04/15/2018   NA 141 04/17/2018   K 4.2 04/17/2018   CL 109 04/17/2018   CREATININE 1.20 04/17/2018   BUN 11 04/17/2018   CO2 27 04/17/2018   INR 1.48 04/16/2018   HGBA1C 5.9 (H) 04/15/2018      PT/INR:  Recent Labs    04/16/18 1453  LABPROT 17.8*  INR 1.48   Radiology: Dg Chest Port 1 View  Result Date: 04/16/2018 CLINICAL DATA:  Post CABG.  Assess tubes and lines. EXAM: PORTABLE CHEST 1 VIEW COMPARISON:  03/25/2018 FINDINGS: Post median sternotomy. Endotracheal tube has tip 5 cm above the carina. Right IJ Swan-Ganz catheter has tip over the main pulmonary artery segment. Enteric tube has tip and side-port over the stomach in the left upper quadrant. Left basilar chest tube is in place. Lungs are somewhat hypoinflated with minimal bibasilar opacification left-greater-than-right likely atelectasis. Suggestion of small amount left pleural fluid. No pneumothorax. Cardiomediastinal silhouette and remainder of the exam is unchanged. IMPRESSION: Minimal bibasilar opacification left-greater-than-right likely atelectasis. Small amount left pleural fluid. Tubes and lines as described. Electronically Signed   By: Marin Olp M.D.   On: 04/16/2018 16:21     Assessment/Plan: S/P Procedure(s) (LRB): CORONARY ARTERY BYPASS GRAFTING (CABG) x 4, ON PUMP, LIMA to LAD, SVG to DIAGONAL, SVG to CIRCUMFLEX, and SVG to PDA, USING LEFT INTERNAL MAMMARY ARTERY AND RIGHT GREATER SAPHENOUS VEIN HARVESTED ENDOSCOPICALLY (N/A) TRANSESOPHAGEAL ECHOCARDIOGRAM (TEE) (N/A)  1 overall progressing well 2 wean Neo and dop as able, good UOP, will need some diuresis for volume overload. Hemodyn stable in sinus rhythm in the 70's 3 CXR shows small effus/atx- routine pulm toilet 4 EKG  is stable in appearance 5 very min ABL anemia 6 renal fxn is good 7 wean insulin gtt off , Hgb A1C 5.9 so will need good nutrition management 8 d/c aline and sganz when off gtts 9 poss d/c tubes soon- not much drainage    Albert Peterson 04/17/2018 7:59 AM   Patient remained alert neurologically intact, stable postop DC chest tubes today I have seen and examined Nathaneil Canary  D Ehly and agree with the above assessment  and plan.  Grace Isaac MD Beeper [phone] Office [phone] 04/17/2018 8:11 AM

## 2018-04-17 NOTE — Progress Notes (Signed)
Patient ID: Albert Peterson, male   DOB: 10-21-1951, 68 y.o.   MRN: [id number] TCTS Evening Rounds:  Hemodynamically stable on dop 3, low dose neo  sats 96%  Urine output ok  BMET    Component Value Date/Time   NA 141 04/17/2018 1609   K 4.1 04/17/2018 1609   CL 104 04/17/2018 1609   CO2 27 04/17/2018 0420   GLUCOSE 141 (H) 04/17/2018 1609   BUN 18 04/17/2018 1609   CREATININE 1.30 (H) 04/17/2018 1609   CALCIUM 8.1 (L) 04/17/2018 0420   GFRNONAA 43 (L) 04/17/2018 1601   GFRAA 50 (L) 04/17/2018 1601   CBC    Component Value Date/Time   WBC 19.5 (H) 04/17/2018 1601   RBC 3.87 (L) 04/17/2018 1601   HGB 11.2 (L) 04/17/2018 1609   HCT 33.0 (L) 04/17/2018 1609   PLT 184 04/17/2018 1601   MCV 92.5 04/17/2018 1601   MCH 29.7 04/17/2018 1601   MCHC 32.1 04/17/2018 1601   RDW 12.6 04/17/2018 1601   LYMPHSABS 1.5 04/08/2018 0820   MONOABS 0.6 04/08/2018 0820   EOSABS 0.2 04/08/2018 0820   BASOSABS 0.1 04/08/2018 0820

## 2018-04-18 ENCOUNTER — Inpatient Hospital Stay (HOSPITAL_COMMUNITY): Payer: Medicare Other

## 2018-04-18 LAB — CBC
HCT: 34.6 % — ABNORMAL LOW (ref 39.0–52.0)
Hemoglobin: 10.9 g/dL — ABNORMAL LOW (ref 13.0–17.0)
MCH: 29.4 pg (ref 26.0–34.0)
MCHC: 31.5 g/dL (ref 30.0–36.0)
MCV: 93.3 fL (ref 78.0–100.0)
Platelets: 164 10*3/uL (ref 150–400)
RBC: 3.71 MIL/uL — ABNORMAL LOW (ref 4.22–5.81)
RDW: 12.7 % (ref 11.5–15.5)
WBC: 18.1 10*3/uL — ABNORMAL HIGH (ref 4.0–10.5)

## 2018-04-18 LAB — BASIC METABOLIC PANEL
Anion gap: 7 (ref 5–15)
BUN: 22 mg/dL — ABNORMAL HIGH (ref 6–20)
CO2: 30 mmol/L (ref 22–32)
Calcium: 8.5 mg/dL — ABNORMAL LOW (ref 8.9–10.3)
Chloride: 104 mmol/L (ref 101–111)
Creatinine, Ser: 1.55 mg/dL — ABNORMAL HIGH (ref 0.61–1.24)
GFR calc Af Amer: 51 mL/min — ABNORMAL LOW (ref >60)
GFR calc non Af Amer: 44 mL/min — ABNORMAL LOW (ref >60)
Glucose, Bld: 120 mg/dL — ABNORMAL HIGH (ref 65–99)
Potassium: 4.2 mmol/L (ref 3.5–5.1)
Sodium: 141 mmol/L (ref 135–145)

## 2018-04-18 LAB — GLUCOSE, CAPILLARY
Glucose-Capillary: 107 mg/dL — ABNORMAL HIGH (ref 65–99)
Glucose-Capillary: 120 mg/dL — ABNORMAL HIGH (ref 65–99)
Glucose-Capillary: 132 mg/dL — ABNORMAL HIGH (ref 65–99)
Glucose-Capillary: 158 mg/dL — ABNORMAL HIGH (ref 65–99)
Glucose-Capillary: 162 mg/dL — ABNORMAL HIGH (ref 65–99)
Glucose-Capillary: 93 mg/dL (ref 65–99)

## 2018-04-18 MED ORDER — TRAMADOL HCL 50 MG PO TABS: 50.0000 mg | ORAL_TABLET | ORAL | Status: DC | PRN

## 2018-04-18 MED ORDER — PROMETHAZINE HCL 25 MG/ML IJ SOLN
12.5000 mg | INTRAMUSCULAR | Status: DC | PRN
Start: 2018-04-18 — End: 2018-04-22
  Administered 2018-04-18 (×2): 12.5 mg via INTRAVENOUS
  Filled 2018-04-18 (×2): qty 1

## 2018-04-18 MED ORDER — FUROSEMIDE 10 MG/ML IJ SOLN
40.0000 mg | Freq: Once | INTRAMUSCULAR | Status: AC
  Administered 2018-04-18: 40 mg via INTRAVENOUS
  Filled 2018-04-18: qty 4

## 2018-04-18 MED ORDER — METOCLOPRAMIDE HCL 5 MG/ML IJ SOLN
10.0000 mg | Freq: Four times a day (QID) | INTRAMUSCULAR | Status: AC
  Administered 2018-04-18 – 2018-04-19 (×3): 10 mg via INTRAVENOUS
  Filled 2018-04-18 (×3): qty 2

## 2018-04-18 MED ORDER — OXYCODONE HCL 5 MG PO TABS: 5.0000 mg | ORAL_TABLET | ORAL | Status: DC | PRN

## 2018-04-18 MED FILL — Mannitol IV Soln 20%: INTRAVENOUS | Qty: 500 | Status: AC

## 2018-04-18 MED FILL — Sodium Chloride IV Soln 0.9%: INTRAVENOUS | Qty: 2000 | Status: AC

## 2018-04-18 MED FILL — Sodium Bicarbonate IV Soln 8.4%: INTRAVENOUS | Qty: 50 | Status: AC

## 2018-04-18 MED FILL — Heparin Sodium (Porcine) Inj 1000 Unit/ML: INTRAMUSCULAR | Qty: 30 | Status: AC

## 2018-04-18 MED FILL — Albumin, Human Inj 5%: INTRAVENOUS | Qty: 250 | Status: AC

## 2018-04-18 MED FILL — Heparin Sodium (Porcine) Inj 1000 Unit/ML: INTRAMUSCULAR | Qty: 10 | Status: AC

## 2018-04-18 MED FILL — Lidocaine HCl(Cardiac) IV PF Soln Pref Syr 100 MG/5ML (2%): INTRAVENOUS | Qty: 5 | Status: AC

## 2018-04-18 MED FILL — Electrolyte-R (PH 7.4) Solution: INTRAVENOUS | Qty: 4000 | Status: AC

## 2018-04-18 NOTE — Evaluation (Signed)
Physical Therapy Evaluation Patient Details Name: Albert Peterson MRN: [id number] DOB: Mar 15, 1951 Today's Date: 04/18/2018   History of Present Illness  67 y.o. male s/p CABG x4 on 04/16/18 to treat three-vessel coronary artery disease with unstable angina. PMH includes: DM2, HTN, Neck Surgery.     Clinical Impression  Patient is s/p above surgery resulting in functional limitations due to the deficits listed below (see PT Problem List). PTA, pt living with wife in 1 story home with stairs to enter, independent with all mobility working as a Acupuncturist. Upon eval, patient ambulating the unit with RW and close supervision/intermittent min guard and no c/o of chest pain or SOB. Patient with 1 seated rest break, SpO2 >95% on 2L, HR max 90 . Reviewed sternal precautions, pt demonstrating adherence during session. Anticipate patient will do well and be able to return home once medically stable, recommending OP cardiac rehab, pt and wife agree. Patient will benefit from skilled PT to increase their independence and safety with mobility to allow discharge to the venue listed below.    Vitals:  Rest: BP 105/61 MAP 75  SpO2 95% on 2L HR 77 Standing: BP 85/64 MAP 72 Walking 500': BP 107/67, SpO2 97% on 2L, HR 86     Follow Up Recommendations Supervision for mobility/OOB(OP cardiac rehab. )    Equipment Recommendations  (TBD pending progression)    Recommendations for Other Services OT consult     Precautions / Restrictions Precautions Precautions: Fall;Sternal Precaution Booklet Issued: No Precaution Comments: verbally reviewed sternal precautions, pt demonstrating understanding.  Restrictions Weight Bearing Restrictions: Yes(sternal precautions)      Mobility  Bed Mobility Overal bed mobility: Needs Assistance Bed Mobility: Supine to Sit     Supine to sit: Min assist     General bed mobility comments: Min A to help power trunk up  Transfers Overall transfer level: Needs  assistance Equipment used: Rolling walker (2 wheeled);None Transfers: Sit to/from [employer] to Stand: Min assist Stand pivot transfers: Min assist       General transfer comment: cues for adherence to sternal precautions and hand placement, min A to help power up, once standing, min guard.   Ambulation/Gait Ambulation/Gait assistance: Min guard;Supervision Ambulation Distance (Feet): 550 Feet Assistive device: Rolling walker (2 wheeled) Gait Pattern/deviations: Step-to pattern;Step-through pattern Gait velocity: decreased   General Gait Details: Patient ambulated the unit with 1 seated rest break. SpO2 over 92% on 2L, HR max 90. Patient walking slowly with shorterned  step length step through pattern. RW for stability, close supervision with chair follow.   Stairs            Wheelchair Mobility    Modified Rankin (Stroke Patients Only)       Balance Overall balance assessment: Needs assistance   Sitting balance-Leahy Scale: Fair       Standing balance-Leahy Scale: Fair                               Pertinent Vitals/Pain Pain Assessment: No/denies pain    Home Living Family/patient expects to be discharged to:: Private residence Living Arrangements: Spouse/significant other Available Help at Discharge: Family Type of Home: House Home Access: Stairs to enter Entrance Stairs-Rails: Can reach both Entrance Stairs-Number of Steps: 3 Home Layout: One level        Prior Function Level of Independence: Independent(works as a logger, wife works at a nursing home. independent)  Hand Dominance        Extremity/Trunk Assessment   Upper Extremity Assessment Upper Extremity Assessment: Defer to OT evaluation    Lower Extremity Assessment Lower Extremity Assessment: Overall WFL for tasks assessed    Cervical / Trunk Assessment Cervical / Trunk Assessment: Normal  Communication   Communication: No  difficulties  Cognition Arousal/Alertness: Awake/alert Behavior During Therapy: WFL for tasks assessed/performed Overall Cognitive Status: Within Functional Limits for tasks assessed                                        General Comments General comments (skin integrity, edema, etc.): Wife present for sessoin    Exercises     Assessment/Plan    PT Assessment Patient needs continued PT services  PT Problem List Decreased strength;Decreased activity tolerance;Decreased mobility;Decreased balance       PT Treatment Interventions DME instruction;Gait training;Stair training;Functional mobility training;Therapeutic activities;Therapeutic exercise;Balance training    PT Goals (Current goals can be found in the Care Plan section)  Acute Rehab PT Goals Patient Stated Goal: return home when able PT Goal Formulation: With patient Time For Goal Achievement: 05/02/18    Frequency Min 3X/week   Barriers to discharge        Co-evaluation               AM-PAC PT "6 Clicks" Daily Activity  Outcome Measure Difficulty turning over in bed (including adjusting bedclothes, sheets and blankets)?: Unable Difficulty moving from lying on back to sitting on the side of the bed? : Unable Difficulty sitting down on and standing up from a chair with arms (e.g., wheelchair, bedside commode, etc,.)?: Unable Help needed moving to and from a bed to chair (including a wheelchair)?: A Little Help needed walking in hospital room?: A Little Help needed climbing 3-5 steps with a railing? : A Lot 6 Click Score: 11    End of Session Equipment Utilized During Treatment: Gait belt;Oxygen Activity Tolerance: Patient tolerated treatment well Patient left: in chair;with call bell/phone within reach;with family/visitor present Nurse Communication: Mobility status PT Visit Diagnosis: Unsteadiness on feet (R26.81);Difficulty in walking, not elsewhere classified (R26.2)    Time:  0920-1010 PT Time Calculation (min) (ACUTE ONLY): 50 min   Charges:   PT Evaluation $PT Eval Low Complexity: 1 Low PT Treatments $Gait Training: 23-37 mins $Therapeutic Activity: 8-22 mins   PT G Codes:        Reinaldo Berber, PT, DPT Acute Rehab Services Pager: [phone]    Reinaldo Berber 04/18/2018, 10:50 AM

## 2018-04-18 NOTE — Progress Notes (Signed)
Pt c/o nausea/vomiting, not relived by Zofran. Unable to take daily medications or tolerate oral intake. Lars Pinks, PA paged, IV phenergran and Reglan ordered. Keeping pt NPO and reassessing.

## 2018-04-18 NOTE — Op Note (Signed)
NAME: CAPRI, RABEN MEDICAL RECORD ZJ:[id number] ACCOUNT [id number] DATE OF BIRTH:04/04/1951 FACILITY: [facility] LOCATION: [facility] PHYSICIAN:Keirra Zeimet Maryruth Bun, MD  OPERATIVE REPORT  DATE OF PROCEDURE:  04/16/2018  PREOPERATIVE DIAGNOSIS:  Three-vessel coronary artery disease with unstable angina.  POSTOPERATIVE DIAGNOSIS:  Three-vessel coronary artery disease with unstable angina.  SURGICAL PROCEDURE:  Coronary artery bypass grafting x4 with the left internal mammary to the left anterior descending coronary artery, reverse saphenous vein graft to the diagonal coronary artery, reverse saphenous vein graft to the obtuse marginal  coronary artery, reverse saphenous vein graft to the posterior descending coronary artery with right thigh and calf greater saphenous endoscopic vein harvesting.  SURGEON:  Lanelle Bal, MD  FIRST ASSISTANT:  Lars Pinks, PA-C  BRIEF HISTORY:  The patient is a 67 year old male who presented with increasing anginal symptoms.  A stress test was positive, and the patient underwent cardiac catheterization which demonstrated significant 3-vessel coronary artery disease with  high-grade stenosis greater than 90% involving the proximal and mid-LAD.  The very distal circumflex branch was occluded with collateral filling from the right coronary artery.  A large branching obtuse marginal vessel was present with high-grade  proximal disease.  The right coronary artery was diffusely diseased.  Overall, ventricular function was preserved.  Because of the patient's severe 3-vessel coronary artery disease symptoms and positive stress test, coronary artery bypass grafting was  recommended.  Risks and options were discussed with the patient in detail, and he was agreeable with proceeding with bypass surgery and signed informed consent.  DESCRIPTION OF PROCEDURE:  The Swan-Ganz and arterial line monitors were placed.  The patient underwent general endotracheal anesthesia  without incident.  Skin of the chest and legs was prepped with Betadine and draped in the usual sterile manner.   Appropriate timeout was performed, and we proceeded with endoscopic vein harvesting of the right greater saphenous vein in the thigh and calf.  The vein was of good quality and caliber.  Median sternotomy was performed.  The left internal mammary artery  was dissected down as a pedicle graft.  The distal artery was divided and had excellent free flow.  The vessel was hydrostatically dilated with heparinized saline.  The pericardium was then opened.  Overall, ventricular function appeared preserved.  Dr.  Tobias Alexander had placed a TEE probe, confirming adequate valvular function and overall preserved LV function.  The patient was systemically heparinized.  The ascending aorta was cannulated.  The right atrium was cannulated.  An aortic root vent cardioplegia  needle was introduced into the ascending aorta.  The patient was placed on cardiopulmonary bypass, 2.4 L/min per sq m.  Sites of anastomosis were inspected and dissected out of the epicardium.  The patient's body temperature was cooled to 32 degrees.   Aortic cross-clamp was applied, and 500 mL of cold blood potassium cardioplegia was administered antegrade.  Attention was turned first to the posterior descending coronary artery, which was opened.  It was a relatively small thin-walled vessel but did  admit a 1 mm probe distally.  The vessel was 1.3 mm in size.  Using a running 8-0 Prolene, a distal anastomosis was performed.  The heart was then elevated in the very distal circumflex branch as it arose from the AV groove.  It was very small, really  too small to bypass.  The larger first obtuse marginal vessel was opened and admitted a 1.5 mm probe proximally and distally.  Using a running 7-0 Prolene, a distal anastomosis was performed.  Additional  cold blood cardioplegia was administered down the  vein grafts.  The diagonal coronary artery was a  relatively small vessel but was opened and admitted a 1 mm probe distally.  Using running 8-0 Prolene, a distal anastomosis was performed with a segment of reverse saphenous vein graft.  Attention was then  turned to the left anterior descending coronary artery where between the mid and distal third of the vessel, the LAD was opened and admitted a 1.5 mm probe distally.  Using a running 8-0 Prolene, the left internal mammary artery was anastomosed to the  left anterior descending coronary artery.  With cross-clamp still in place, 3 punch aortotomies were performed, and each of the 3 vein grafts were anastomosed to the ascending aorta.  The bulldog was then removed from the mammary artery with prompt rise  in myocardial septal temperature.  The heart was allowed to passively fill and deair, and the proximal anastomoses were completed.  The aortic cross-clamp was removed with a total cross-clamp time of 94 minutes.  The patient spontaneously converted to a  sinus rhythm.  Sites of anastomosis were inspected and free of bleeding.  The body temperature was rewarmed to 37 degrees.  Atrial and ventricular pacing wires were applied.  The patient was atrially paced to increased rate.  He was then ventilated and  weaned from cardiopulmonary bypass without difficulty.  He remained hemodynamically stable.  He was decannulated in the usual fashion.  Protamine sulfate was administered.  With the operative field hemostatic, graft markers were applied.  A left pleural  tube and Blake mediastinal drain were left in place.  The pericardium was loosely reapproximated.  The sternum was closed with #6 stainless steel wire, fascia closed with interrupted 0 Vicryl, running 3-0 Vicryl in the subcutaneous tissue, 4-0  subcuticular stitch in the skin edges.  Dry dressings were applied.  Sponge and needle count was reported as correct at completion of the procedure.  The patient tolerated the procedure without complication and was  transferred to the surgical intensive  care unit for further postoperative care.  The patient did not require any blood bank blood products during the procedure.  Total pump time was 122 minutes.  LN/NUANCE  D:04/18/2018 T:04/18/2018 JOB:[id number]

## 2018-04-18 NOTE — Progress Notes (Signed)
CT surgery p.m. Rounds  Patient has stable day Good urine output Creatinine recheck in a.m.

## 2018-04-18 NOTE — Progress Notes (Signed)
Patient ID: Albert Peterson, male   DOB: 31-Dec-1950, 67 y.o.   MRN: [id number] [facility] DAILY ICU PROGRESS NOTE                   [address]            ,[place] [address]          [phone]   2 Days Post-Op Procedure(s) (LRB): CORONARY ARTERY BYPASS GRAFTING (CABG) x 4, ON PUMP, LIMA to LAD, SVG to DIAGONAL, SVG to CIRCUMFLEX, and SVG to PDA, USING LEFT INTERNAL MAMMARY ARTERY AND RIGHT GREATER SAPHENOUS VEIN HARVESTED ENDOSCOPICALLY (N/A) TRANSESOPHAGEAL ECHOCARDIOGRAM (TEE) (N/A)  Total Length of Stay:  LOS: 4 days   Subjective: Patient awake alert, neurologically intact, patient has been very slow to mobilize  Objective: Vital signs in last 24 hours: Temp:  [97.9 F (36.6 C)-100 F (37.8 C)] 98.7 F (37.1 C) (05/10 0400) Pulse Rate:  [70-100] 76 (05/10 0700) Cardiac Rhythm: Atrial paced (05/09 2000) Resp:  [11-28] 19 (05/10 0700) BP: (86-120)/(54-72) 94/61 (05/10 0700) SpO2:  [87 %-100 %] 96 % (05/10 0700) Arterial Line BP: (97-123)/(45-94) 106/45 (05/09 1530) Weight:  [222 lb 7.1 oz (100.9 kg)] 222 lb 7.1 oz (100.9 kg) (05/10 0500)  Filed Weights   04/16/18 0538 04/17/18 0530 04/18/18 0500  Weight: 214 lb 14.4 oz (97.5 kg) 224 lb 3.2 oz (101.7 kg) 222 lb 7.1 oz (100.9 kg)    Weight change: -1 lb 12.1 oz (-0.796 kg)   Hemodynamic parameters for last 24 hours: PAP: (31)/(17) 31/17  Intake/Output from previous day: 05/09 0701 - 05/10 0700 In: 712.8 [I.V.:612.8; IV Piggyback:100] Out: 1295 [Urine:1225; Chest Tube:70]  Intake/Output this shift: No intake/output data recorded.  Current Meds: Scheduled Meds: . acetaminophen  1,000 mg Oral Q6H   Or  . acetaminophen (TYLENOL) oral liquid 160 mg/5 mL  1,000 mg Per Tube Q6H  . aspirin EC  325 mg Oral Daily   Or  . aspirin  324 mg Per Tube Daily  . bisacodyl  10 mg Oral Daily   Or  . bisacodyl  10 mg Rectal Daily  . docusate sodium  200 mg Oral Daily  . enoxaparin (LOVENOX) injection  30 mg  Subcutaneous QHS  . insulin aspart  0-24 Units Subcutaneous Q4H  . mouth rinse  15 mL Mouth Rinse BID  . metoprolol tartrate  12.5 mg Oral BID   Or  . metoprolol tartrate  12.5 mg Per Tube BID  . pantoprazole  40 mg Oral Daily  . rosuvastatin  40 mg Oral q1800  . sodium chloride flush  10-40 mL Intracatheter Q12H  . sodium chloride flush  3 mL Intravenous Q12H   Continuous Infusions: . sodium chloride    . DOPamine 3 mcg/kg/min (04/17/18 1600)  . epinephrine    . lactated ringers    . lactated ringers 10 mL/hr at 04/17/18 1800  . nitroGLYCERIN Stopped (04/16/18 1614)  . phenylephrine (NEO-SYNEPHRINE) Adult infusion 4 mcg/min (04/17/18 1800)   PRN Meds:.lactated ringers, metoprolol tartrate, midazolam, morphine injection, ondansetron (ZOFRAN) IV, oxyCODONE, sodium chloride flush, sodium chloride flush, traMADol  General appearance: alert and cooperative Neurologic: intact Heart: regular rate and rhythm, S1, S2 normal, no murmur, click, rub or gallop Lungs: diminished breath sounds bibasilar Abdomen: soft, non-tender; bowel sounds normal; no masses,  no organomegaly Extremities: extremities normal, atraumatic, no cyanosis or edema and Homans sign is negative, no sign of DVT Wound: Dressing intact sternum stable  Lab Results:  CBC: Recent Labs    04/17/18 1601 04/17/18 1609 04/18/18 0420  WBC 19.5*  --  18.1*  HGB 11.5* 11.2* 10.9*  HCT 35.8* 33.0* 34.6*  PLT 184  --  164   BMET:  Recent Labs    04/17/18 0420  04/17/18 1609 04/18/18 0420  NA 141  --  141 141  K 4.2  --  4.1 4.2  CL 109  --  104 104  CO2 27  --   --  30  GLUCOSE 116*  --  141* 120*  BUN 11  --  18 22*  CREATININE 1.20   < > 1.30* 1.55*  CALCIUM 8.1*  --   --  8.5*   < > = values in this interval not displayed.    CMET: Lab Results  Component Value Date   WBC 18.1 (H) 04/18/2018   HGB 10.9 (L) 04/18/2018   HCT 34.6 (L) 04/18/2018   PLT 164 04/18/2018   GLUCOSE 120 (H) 04/18/2018   ALT 17  04/15/2018   AST 14 (L) 04/15/2018   NA 141 04/18/2018   K 4.2 04/18/2018   CL 104 04/18/2018   CREATININE 1.55 (H) 04/18/2018   BUN 22 (H) 04/18/2018   CO2 30 04/18/2018   INR 1.48 04/16/2018   HGBA1C 5.9 (H) 04/15/2018      PT/INR:  Recent Labs    04/16/18 1453  LABPROT 17.8*  INR 1.48   Radiology: No results found.  Acute Kidney Injury (any one)  Increase in SCr by > 0.3 within 48 hours  Increase SCr to > 1.5 times baseline  Urine volume < 0.5 ml/kg/h for 6 hrs  ?Stage 1 - Increase in serum creatinine to 1.5 to 1.9 times baseline, or increase in serum creatinine by ?0.3 mg/dL (?26.5 micromol/L), or reduction in urine output to <0.5 mL/kg per hour for 6 to 12 hours.  ?Stage 2 - Increase in serum creatinine to 2.0 to 2.9 times baseline, or reduction in urine output to <0.5 mL/kg per hour for ?12 hours.  ?Stage 3 - Increase in serum creatinine to 3.0 times baseline, or increase in serum creatinine to ?4.0 mg/dL (?353.6 micromol/L), or reduction in urine output to <0.3 mL/kg per hour for ?24 hours, or anuria for ?12 hours, or the initiation of renal replacement therapy, or, in patients <18 years, decrease in eGFR to <35 mL   Lab Results  Component Value Date   CREATININE 1.55 (H) 04/18/2018   Estimated Creatinine Clearance: 52.5 mL/min (A) (by C-G formula based on SCr of 1.55 mg/dL (H)).  Assessment/Plan: S/P Procedure(s) (LRB): CORONARY ARTERY BYPASS GRAFTING (CABG) x 4, ON PUMP, LIMA to LAD, SVG to DIAGONAL, SVG to CIRCUMFLEX, and SVG to PDA, USING LEFT INTERNAL MAMMARY ARTERY AND RIGHT GREATER SAPHENOUS VEIN HARVESTED ENDOSCOPICALLY (N/A) TRANSESOPHAGEAL ECHOCARDIOGRAM (TEE) (N/A) Mobilize Diuresis Diabetes control Continue Foley to monitor urine output and diuresis Remains on low-dose dopamine and neo-, try to wean neo-off today Mild elevation of creatinine to 1.55, stage I acute kidney injury-continue dopamine and monitor urine output   Grace Isaac 04/18/2018 7:34 AM

## 2018-04-18 NOTE — Progress Notes (Signed)
OT Cancellation Note  Patient Details Name: Albert Peterson MRN: [id number] DOB: 1951/07/03   Cancelled Treatment:    Reason Eval/Treat Not Completed: Other (comment). Pt sick and with emesis earlier, RN reports she gave him phenegren (zofran didn't work per wife) and this has made pt really sleepy. He will arouse but states he still feels sick. Wife concerned about BP, I made RN aware of pt's BP and she said she would come and talk with wife.  Almon Register [phone] 04/18/2018, 1:59 PM

## 2018-04-19 ENCOUNTER — Inpatient Hospital Stay (HOSPITAL_COMMUNITY): Payer: Medicare Other

## 2018-04-19 LAB — GLUCOSE, CAPILLARY
GLUCOSE-CAPILLARY: 137 mg/dL — AB (ref 65–99)
GLUCOSE-CAPILLARY: 104 mg/dL — AB (ref 65–99)
GLUCOSE-CAPILLARY: 103 mg/dL — AB (ref 65–99)
GLUCOSE-CAPILLARY: 95 mg/dL (ref 65–99)
GLUCOSE-CAPILLARY: 111 mg/dL — AB (ref 65–99)
Glucose-Capillary: 102 mg/dL — ABNORMAL HIGH (ref 65–99)
Glucose-Capillary: 122 mg/dL — ABNORMAL HIGH (ref 65–99)

## 2018-04-19 LAB — CBC
HCT: 31.4 % — ABNORMAL LOW (ref 39.0–52.0)
Hemoglobin: 9.7 g/dL — ABNORMAL LOW (ref 13.0–17.0)
MCH: 29.2 pg (ref 26.0–34.0)
MCHC: 30.9 g/dL (ref 30.0–36.0)
MCV: 94.6 fL (ref 78.0–100.0)
Platelets: 158 10*3/uL (ref 150–400)
RBC: 3.32 MIL/uL — ABNORMAL LOW (ref 4.22–5.81)
RDW: 12.6 % (ref 11.5–15.5)
WBC: 12.6 10*3/uL — ABNORMAL HIGH (ref 4.0–10.5)

## 2018-04-19 LAB — BASIC METABOLIC PANEL
Anion gap: 4 — ABNORMAL LOW (ref 5–15)
BUN: 29 mg/dL — ABNORMAL HIGH (ref 6–20)
CO2: 29 mmol/L (ref 22–32)
Calcium: 8.2 mg/dL — ABNORMAL LOW (ref 8.9–10.3)
Chloride: 107 mmol/L (ref 101–111)
Creatinine, Ser: 1.53 mg/dL — ABNORMAL HIGH (ref 0.61–1.24)
GFR calc Af Amer: 52 mL/min — ABNORMAL LOW (ref >60)
GFR calc non Af Amer: 45 mL/min — ABNORMAL LOW (ref >60)
Glucose, Bld: 127 mg/dL — ABNORMAL HIGH (ref 65–99)
Potassium: 4 mmol/L (ref 3.5–5.1)
Sodium: 140 mmol/L (ref 135–145)

## 2018-04-19 MED ORDER — INSULIN ASPART 100 UNIT/ML ~~LOC~~ SOLN
0.0000 [IU] | Freq: Three times a day (TID) | SUBCUTANEOUS | Status: DC
Start: 2018-04-19 — End: 2018-04-20

## 2018-04-19 MED ORDER — INSULIN ASPART 100 UNIT/ML ~~LOC~~ SOLN: 0.0000 [IU] | Freq: Every day | SUBCUTANEOUS | Status: DC

## 2018-04-19 NOTE — Progress Notes (Signed)
CT surgery p.m. Rounds  Patient examined and record reviewed.Hemodynamics stable,labs satisfactory.Patient had stable day.Continue current care. Albert Peterson 04/19/2018   

## 2018-04-19 NOTE — Progress Notes (Signed)
3 Days Post-Op Procedure(s) (LRB): CORONARY ARTERY BYPASS GRAFTING (CABG) x 4, ON PUMP, LIMA to LAD, SVG to DIAGONAL, SVG to CIRCUMFLEX, and SVG to PDA, USING LEFT INTERNAL MAMMARY ARTERY AND RIGHT GREATER SAPHENOUS VEIN HARVESTED ENDOSCOPICALLY (N/A) TRANSESOPHAGEAL ECHOCARDIOGRAM (TEE) (N/A) Subjective: Renal fx stable Wt stable  Feels well walking the halls  Objective: Vital signs in last 24 hours: Temp:  [98.5 F (36.9 C)-99.1 F (37.3 C)] 98.6 F (37 C) (05/11 1132) Pulse Rate:  [69-91] 77 (05/11 1000) Cardiac Rhythm: Normal sinus rhythm (05/11 0800) Resp:  [0-24] 19 (05/11 1000) BP: (87-134)/(55-74) 108/65 (05/11 1000) SpO2:  [94 %-100 %] 100 % (05/11 1000) Weight:  [219 lb 6.4 oz (99.5 kg)] 219 lb 6.4 oz (99.5 kg) (05/11 0500)  Hemodynamic parameters for last 24 hours:    Intake/Output from previous day: 05/10 0701 - 05/11 0700 In: 860.5 [P.O.:480; I.V.:380.5] Out: 1650 [Urine:1600; Emesis/NG output:50] Intake/Output this shift: Total I/O In: 46.5 [I.V.:46.5] Out: 185 [Urine:185]       Exam    General- alert and comfortable    Neck- no JVD, no cervical adenopathy palpable, no carotid bruit   Lungs- clear without rales, wheezes   Cor- regular rate and rhythm, no murmur , gallop   Abdomen- soft, non-tender   Extremities - warm, non-tender, minimal edema   Neuro- oriented, appropriate, no focal weakness   Lab Results: Recent Labs    04/18/18 0420 04/19/18 0320  WBC 18.1* 12.6*  HGB 10.9* 9.7*  HCT 34.6* 31.4*  PLT 164 158   BMET:  Recent Labs    04/18/18 0420 04/19/18 0320  NA 141 140  K 4.2 4.0  CL 104 107  CO2 30 29  GLUCOSE 120* 127*  BUN 22* 29*  CREATININE 1.55* 1.53*  CALCIUM 8.5* 8.2*    PT/INR:  Recent Labs    04/16/18 1453  LABPROT 17.8*  INR 1.48   ABG    Component Value Date/Time   PHART 7.381 04/16/2018 2222   HCO3 26.1 04/16/2018 2222   TCO2 26 04/17/2018 1609   ACIDBASEDEF 4.0 (H) 04/16/2018 1832   O2SAT 92.0  04/16/2018 2222   CBG (last 3)  Recent Labs    04/19/18 0336 04/19/18 0507 04/19/18 0745  GLUCAP 137* 104* 102*    Assessment/Plan: S/P Procedure(s) (LRB): CORONARY ARTERY BYPASS GRAFTING (CABG) x 4, ON PUMP, LIMA to LAD, SVG to DIAGONAL, SVG to CIRCUMFLEX, and SVG to PDA, USING LEFT INTERNAL MAMMARY ARTERY AND RIGHT GREATER SAPHENOUS VEIN HARVESTED ENDOSCOPICALLY (N/A) TRANSESOPHAGEAL ECHOCARDIOGRAM (TEE) (N/A) Mobilize wean off dopamine   LOS: 5 days    Tharon Aquas Trigt III 04/19/2018

## 2018-04-20 LAB — BASIC METABOLIC PANEL
Anion gap: 8 (ref 5–15)
BUN: 25 mg/dL — ABNORMAL HIGH (ref 6–20)
CO2: 30 mmol/L (ref 22–32)
Calcium: 8.5 mg/dL — ABNORMAL LOW (ref 8.9–10.3)
Chloride: 105 mmol/L (ref 101–111)
Creatinine, Ser: 1.32 mg/dL — ABNORMAL HIGH (ref 0.61–1.24)
GFR calc Af Amer: >60 mL/min (ref >60)
GFR calc non Af Amer: 54 mL/min — ABNORMAL LOW (ref >60)
Glucose, Bld: 93 mg/dL (ref 65–99)
Potassium: 3.7 mmol/L (ref 3.5–5.1)
Sodium: 143 mmol/L (ref 135–145)

## 2018-04-20 LAB — GLUCOSE, CAPILLARY
GLUCOSE-CAPILLARY: 101 mg/dL — AB (ref 65–99)
Glucose-Capillary: 123 mg/dL — ABNORMAL HIGH (ref 65–99)
Glucose-Capillary: 133 mg/dL — ABNORMAL HIGH (ref 65–99)
Glucose-Capillary: 94 mg/dL (ref 65–99)

## 2018-04-20 LAB — CBC
HCT: 29.4 % — ABNORMAL LOW (ref 39.0–52.0)
Hemoglobin: 9.3 g/dL — ABNORMAL LOW (ref 13.0–17.0)
MCH: 29.8 pg (ref 26.0–34.0)
MCHC: 31.6 g/dL (ref 30.0–36.0)
MCV: 94.2 fL (ref 78.0–100.0)
Platelets: 202 10*3/uL (ref 150–400)
RBC: 3.12 MIL/uL — ABNORMAL LOW (ref 4.22–5.81)
RDW: 12.5 % (ref 11.5–15.5)
WBC: 7.8 10*3/uL (ref 4.0–10.5)

## 2018-04-20 MED ORDER — SODIUM CHLORIDE 0.9 % IV SOLN: 250.0000 mL | INTRAVENOUS | Status: DC | PRN

## 2018-04-20 MED ORDER — MAGNESIUM HYDROXIDE 400 MG/5ML PO SUSP: 30.0000 mL | Freq: Every day | ORAL | Status: DC | PRN

## 2018-04-20 MED ORDER — SODIUM CHLORIDE 0.9% FLUSH
3.0000 mL | Freq: Two times a day (BID) | INTRAVENOUS | Status: DC
Start: 2018-04-20 — End: 2018-04-22
  Administered 2018-04-21 – 2018-04-22 (×3): 3 mL via INTRAVENOUS

## 2018-04-20 MED ORDER — BISACODYL 10 MG RE SUPP: 10.0000 mg | Freq: Every day | RECTAL | Status: DC | PRN

## 2018-04-20 MED ORDER — INSULIN ASPART 100 UNIT/ML ~~LOC~~ SOLN: 0.0000 [IU] | Freq: Three times a day (TID) | SUBCUTANEOUS | Status: DC

## 2018-04-20 MED ORDER — SODIUM CHLORIDE 0.9% FLUSH: 3.0000 mL | INTRAVENOUS | Status: DC | PRN

## 2018-04-20 MED ORDER — MOVING RIGHT ALONG BOOK
Freq: Once | Status: AC
  Administered 2018-04-21: 10:00:00
  Filled 2018-04-20: qty 1

## 2018-04-20 MED ORDER — POTASSIUM CHLORIDE CRYS ER 20 MEQ PO TBCR
20.0000 meq | EXTENDED_RELEASE_TABLET | Freq: Every day | ORAL | Status: DC
  Administered 2018-04-20: 20 meq via ORAL
  Filled 2018-04-20: qty 1

## 2018-04-20 MED ORDER — BISACODYL 5 MG PO TBEC: 10.0000 mg | DELAYED_RELEASE_TABLET | Freq: Every day | ORAL | Status: DC | PRN

## 2018-04-20 MED ORDER — SODIUM CHLORIDE 0.9% FLUSH
3.0000 mL | Freq: Two times a day (BID) | INTRAVENOUS | Status: DC
Start: 2018-04-20 — End: 2018-04-22
  Administered 2018-04-20 – 2018-04-22 (×3): 3 mL via INTRAVENOUS

## 2018-04-20 MED ORDER — FUROSEMIDE 40 MG PO TABS
40.0000 mg | ORAL_TABLET | Freq: Every day | ORAL | Status: DC
  Administered 2018-04-20 – 2018-04-22 (×3): 40 mg via ORAL
  Filled 2018-04-20 (×3): qty 1

## 2018-04-20 MED ORDER — MOVING RIGHT ALONG BOOK
Freq: Once | Status: AC
  Administered 2018-04-20: 10:00:00
  Filled 2018-04-20: qty 1

## 2018-04-20 MED ORDER — INSULIN ASPART 100 UNIT/ML ~~LOC~~ SOLN
0.0000 [IU] | Freq: Three times a day (TID) | SUBCUTANEOUS | Status: DC
  Administered 2018-04-20: 3 [IU] via SUBCUTANEOUS

## 2018-04-20 NOTE — Progress Notes (Signed)
4 Days Post-Op Procedure(s) (LRB): CORONARY ARTERY BYPASS GRAFTING (CABG) x 4, ON PUMP, LIMA to LAD, SVG to DIAGONAL, SVG to CIRCUMFLEX, and SVG to PDA, USING LEFT INTERNAL MAMMARY ARTERY AND RIGHT GREATER SAPHENOUS VEIN HARVESTED ENDOSCOPICALLY (N/A) TRANSESOPHAGEAL ECHOCARDIOGRAM (TEE) (N/A) Subjective: Doing well Renal fx better Ready for transfer  Objective: Vital signs in last 24 hours: Temp:  [98.2 F (36.8 C)-99 F (37.2 C)] 99 F (37.2 C) (05/12 0804) Pulse Rate:  [69-96] 96 (05/12 0800) Cardiac Rhythm: Normal sinus rhythm (05/12 0800) Resp:  [15-29] 23 (05/12 0800) BP: (103-145)/(54-123) 145/123 (05/12 0800) SpO2:  [89 %-100 %] 96 % (05/12 0800) Weight:  [218 lb 14.7 oz (99.3 kg)] 218 lb 14.7 oz (99.3 kg) (05/12 0500)  Hemodynamic parameters for last 24 hours:    Intake/Output from previous day: 05/11 0701 - 05/12 0700 In: 1227.8 [P.O.:960; I.V.:267.8] Out: 835 [Urine:835] Intake/Output this shift: No intake/output data recorded.       Exam    General- alert and comfortable    Neck- no JVD, no cervical adenopathy palpable, no carotid bruit   Lungs- clear without rales, wheezes   Cor- regular rate and rhythm, no murmur , gallop   Abdomen- soft, non-tender   Extremities - warm, non-tender, minimal edema   Neuro- oriented, appropriate, no focal weakness   Lab Results: Recent Labs    04/19/18 0320 04/20/18 0515  WBC 12.6* 7.8  HGB 9.7* 9.3*  HCT 31.4* 29.4*  PLT 158 202   BMET:  Recent Labs    04/19/18 0320 04/20/18 0515  NA 140 143  K 4.0 3.7  CL 107 105  CO2 29 30  GLUCOSE 127* 93  BUN 29* 25*  CREATININE 1.53* 1.32*  CALCIUM 8.2* 8.5*    PT/INR: No results for input(s): LABPROT, INR in the last 72 hours. ABG    Component Value Date/Time   PHART 7.381 04/16/2018 2222   HCO3 26.1 04/16/2018 2222   TCO2 26 04/17/2018 1609   ACIDBASEDEF 4.0 (H) 04/16/2018 1832   O2SAT 92.0 04/16/2018 2222   CBG (last 3)  Recent Labs    04/19/18 1612  04/19/18 2115 04/20/18 0648  GLUCAP 95 111* 101*    Assessment/Plan: S/P Procedure(s) (LRB): CORONARY ARTERY BYPASS GRAFTING (CABG) x 4, ON PUMP, LIMA to LAD, SVG to DIAGONAL, SVG to CIRCUMFLEX, and SVG to PDA, USING LEFT INTERNAL MAMMARY ARTERY AND RIGHT GREATER SAPHENOUS VEIN HARVESTED ENDOSCOPICALLY (N/A) TRANSESOPHAGEAL ECHOCARDIOGRAM (TEE) (N/A) Mobilize Diuresis Diabetes control Plan for transfer to step-down: see transfer orders   LOS: 6 days    Tharon Aquas Trigt III 04/20/2018

## 2018-04-20 NOTE — Discharge Instructions (Signed)
Coronary Artery Bypass Grafting, Care After °These instructions give you information on caring for yourself after your procedure. Your doctor may also give you more specific instructions. Call your doctor if you have any problems or questions after your procedure. °Follow these instructions at home: °· Only take medicine as told by your doctor. Take medicines exactly as told. Do not stop taking medicines or start any new medicines without talking to your doctor first. °· Take your pulse as told by your doctor. °· Do deep breathing as told by your doctor. Use your breathing device (incentive spirometer), if given, to practice deep breathing several times a day. Support your chest with a pillow or your arms when you take deep breaths or cough. °· Keep the area clean, dry, and protected where the surgery cuts (incisions) were made. Remove bandages (dressings) only as told by your doctor. If strips were applied to surgical area, do not take them off. They fall off on their own. °· Check the surgery area daily for puffiness (swelling), redness, or leaking fluid. °· If surgery cuts were made in your legs: °? Avoid crossing your legs. °? Avoid sitting for long periods of time. Change positions every 30 minutes. °? Raise your legs when you are sitting. Place them on pillows. °· Wear stockings that help keep blood clots from forming in your legs (compression stockings). °· Only take sponge baths until your doctor says it is okay to take showers. Pat the surgery area dry. Do not rub the surgery area with a washcloth or towel. Do not bathe, swim, or use a hot tub until your doctor says it is okay. °· Eat foods that are high in fiber. These include raw fruits and vegetables, whole grains, beans, and nuts. Choose lean meats. Avoid canned, processed, and fried foods. °· Drink enough fluids to keep your pee (urine) clear or pale yellow. °· Weigh yourself every day. °· Rest and limit activity as told by your doctor. You may be told  to: °? Stop any activity if you have chest pain, shortness of breath, changes in heartbeat, or dizziness. Get help right away if this happens. °? Move around often for short amounts of time or take short walks as told by your doctor. Gradually become more active. You may need help to strengthen your muscles and build endurance. °? Avoid lifting, pushing, or pulling anything heavier than 10 pounds (4.5 kg) for at least 6 weeks after surgery. °· Do not drive until your doctor says it is okay. °· Ask your doctor when you can go back to work. °· Ask your doctor when you can begin sexual activity again. °· Follow up with your doctor as told. °Contact a doctor if: °· You have puffiness, redness, more pain, or fluid draining from the incision site. °· You have a fever. °· You have puffiness in your ankles or legs. °· You have pain in your legs. °· You gain 2 or more pounds (0.9 kg) a day. °· You feel sick to your stomach (nauseous) or throw up (vomit). °· You have watery poop (diarrhea). °Get help right away if: °· You have chest pain that goes to your jaw or arms. °· You have shortness of breath. °· You have a fast or irregular heartbeat. °· You notice a "clicking" in your breastbone when you move. °· You have numbness or weakness in your arms or legs. °· You feel dizzy or light-headed. °This information is not intended to replace advice given to you by   your health care provider. Make sure you discuss any questions you have with your health care provider. °Document Released: 12/01/2013 Document Revised: 05/03/2016 Document Reviewed: 05/05/2013 °[employer] Interactive Patient Education © 2017 [employer] ° °

## 2018-04-20 NOTE — Discharge Summary (Signed)
Physician Discharge Summary       [address]       [place] [address]             [phone]    Patient ID: Albert Peterson MRN: [id number] DOB/AGE: 1951/05/30 67 y.o.  Admit date: 04/14/2018 Discharge date: 04/22/2018  Admission Diagnoses: 1. Unstable angina ([place]) 2. Coronary artery disease  Discharge Diagnoses:  1. S/P CABG x 4 2.  Left ICA are consistent with a 60-79% stenosis 3. ABL anemia 4. History of HTN (hypertension), benign 5. History of hyperlipidemia 6. History of tobacco abuse 7. History of DM2 (diabetes mellitus, type 2) ([place]) 8. History of psoriasis 9. History of diverticulitis    Procedure (s):  Martinique, Peter M, MD (Primary)    Procedures   LEFT HEART CATH AND CORONARY ANGIOGRAPHY on 04/14/2018:  Conclusion     Prox RCA lesion is 50% stenosed.  Dist RCA lesion is 50% stenosed.  Ost Cx to Prox Cx lesion is 85% stenosed.  Prox Cx lesion is 80% stenosed.  Ost 3rd Mrg lesion is 70% stenosed.  Prox LAD lesion is 90% stenosed.  Mid LAD to Dist LAD lesion is 80% stenosed.  Dist LAD lesion is 90% stenosed.  The left ventricular systolic function is normal.  LV end diastolic pressure is normal.  The left ventricular ejection fraction is 55-65% by visual estimate.   1. 3 vessel obstructive CAD 2. Normal LV function 3. Normal LV EDP  Plan: Disease is not favorable for PCI. I would recommend CABG for revascularization.    Coronary artery bypass grafting x4 with the left internal mammary to the left anterior descending coronary artery, reverse saphenous vein graft to the diagonal coronary artery, reverse saphenous vein graft to the obtuse marginal coronary artery, reverse saphenous vein graft to the posterior descending coronary artery with right thigh and calf greater saphenous endoscopic vein harvesting by Dr. Servando Snare on 04/16/2018.  History of Presenting Illness: TEDD Peterson 67 y.o. male is seen in the hospital today  for three-vessel coronary artery disease.  The patient gives a history of 4 to 5 years of exertional shortness of breath.  He underwent cardiac catheterization at no [facility] in 2015, no significant disease was noted and the patient was referred to the pulmonologist.  No specific pulmonary diagnosis was made.  The patient notes more recently increasing episodes of both shortness of breath and chest discomfort sometimes with exertion and sometimes at rest.  Because of the increasing severity and frequency of the symptoms he was referred to Dr. Bronson Ing.  Stress test was positive, the patient noted similar symptoms during the stress test, he required nitroglycerin and oxygen at the completion.  The patient was then referred for cardiac catheterization today, 2 days ago while at a car so he noted recurrent chest pain and took the nitroglycerin of 1 of his friends with relief.  Patient with positive stress test, symptoms of angina with minimal exertion and three-vessel coronary artery disease by cardiac catheterization.  Dr. Servando Snare agreed with Dr. Martinique due to the anatomy the patient would best benefit from coronary artery bypass grafting.  Risks and options are discussed with him in detail and he is willing to proceed. Pre operative carotid duplex US showed a right ICA are consistent with a 40-59% stenosis and a left ICA are consistent with a 60-79% stenosis. Patient underwent a CABG x 4 on 04/16/2018.  Brief Hospital Course:  The patient was extubated the evening of  surgery without difficulty.  remained afebrile and hemodynamically stable. He was weaned off of Dopamine and Neo Synephrine drips. Albert Peterson, a line, chest tubes, and foley were removed early in the post operative course. Lopressor was started and titrated accordingly. He was volume over loaded and diuresed. He had ABL anemia. Hedid not require a post op transfusion. Last H and H was 10/32. He was weaned off the insulin drip.  The patient's HGA1C  pre op was 5.9. The patient was felt surgically stable for transfer from the ICU to PCTU for further convalescence . He/she continues to progress with cardiac rehab. He was ambulating on room air. He has been tolerating a diet and has had a bowel movement. Epicardial pacing wires were removed. Chest tube sutures will be removed the day of discharge. The patient is felt surgically stable for discharge today.   Latest Vital Signs: Blood pressure (!) 141/78, pulse 93, temperature 98.4 F (36.9 C), temperature source Oral, resp. rate 20, height 5\' 8"  (1.727 m), weight 97.1 kg (214 lb), SpO2 95 %.  Physical Exam:  General appearance: alert, cooperative and no distress Heart: regular rate and rhythm Lungs: mild basilar crackles Abdomen: benign Extremities: + edema Wound: incis healing well   Discharge Condition:Stable and discharged to home.  Recent laboratory studies:  Lab Results  Component Value Date   WBC 8.3 04/21/2018   HGB 10.6 (L) 04/21/2018   HCT 32.8 (L) 04/21/2018   MCV 91.1 04/21/2018   PLT 261 04/21/2018   Lab Results  Component Value Date   NA 141 04/21/2018   K 3.7 04/21/2018   CL 102 04/21/2018   CO2 27 04/21/2018   CREATININE 1.34 (H) 04/21/2018   GLUCOSE 108 (H) 04/21/2018      Diagnostic Studies: Dg Chest 2 View  Result Date: 04/21/2018 CLINICAL DATA:  Follow-up coronary bypass grafting EXAM: CHEST - 2 VIEW COMPARISON:  04/19/2018 FINDINGS: Cardiac shadow is stable. Postsurgical changes are again seen. Lungs are well aerated bilaterally. Persistent small left pleural effusion and basilar atelectasis is noted. Improved aeration is noted on the right. Small right effusion remains posteriorly. No acute bony abnormality is noted. IMPRESSION: Small bilateral pleural effusions with left basilar atelectasis. Electronically Signed   By: Albert Peterson M.D.   On: 04/21/2018 07:14   Dg Chest 2 View  Result Date: 03/25/2018 CLINICAL DATA:  Chest pain EXAM: CHEST - 2 VIEW  COMPARISON:  02/04/2018 FINDINGS: Anterior cervical fusion. Normal cardiac silhouette. No effusion, infiltrate pneumothorax. subtle linear interstitial markings extend to the peripheral lung. No acute osseous abnormality. IMPRESSION: Mild interstitial edema pattern. Electronically Signed   By: Suzy Bouchard M.D.   On: 03/25/2018 21:10   Dg Chest Port 1 View  Result Date: 04/19/2018 CLINICAL DATA:  Chest tube in place. EXAM: PORTABLE CHEST 1 VIEW COMPARISON:  Apr 18, 2018 FINDINGS: A right IJ sheath remains. No other support apparatus identified. Bilateral pleural effusions with underlying opacities are more prominent the interval. Probable mild edema/pulmonary venous congestion. No other change. IMPRESSION: 1. Support apparatus as above. 2. Probable mild edema. Bilateral layering effusions are more prominent in the interval. Electronically Signed   By: Dorise Bullion III M.D   On: 04/19/2018 08:06   Dg Chest Port 1 View  Result Date: 04/18/2018 CLINICAL DATA:  Status post CABG. EXAM: PORTABLE CHEST 1 VIEW COMPARISON:  04/17/2018 FINDINGS: Sequelae of recent CABG are again identified. Swan-Ganz catheter has been removed. Right jugular sheath remains. Left chest tube has been removed,  and no pneumothorax is identified. The cardiac silhouette remains enlarged. Lung volumes remain low with similar appearance of central pulmonary vascular congestion, bibasilar atelectasis, and possible small bilateral pleural effusions. IMPRESSION: 1. Interval Swan-Ganz catheter and left chest tube removal. No pneumothorax. 2. Unchanged low lung volumes and bibasilar atelectasis. Electronically Signed   By: Logan Bores M.D.   On: 04/18/2018 11:59   Dg Chest Port 1 View  Result Date: 04/17/2018 CLINICAL DATA:  Status post CABG 04/16/2018. EXAM: PORTABLE CHEST 1 VIEW COMPARISON:  Single-view of the chest 04/16/2018. FINDINGS: ETT and NG tube have been removed. The patient's Swan-Ganz catheter has been advanced with the tip  now in the mid aspect of the right main pulmonary artery. Left chest tube is noted. Lung volumes are low with increased basilar atelectasis. Small left effusion is noted. No pneumothorax. Heart size is enlarged. IMPRESSION: Status post extubation and NG tube removal. Swan-Ganz catheter tip now projects in the mid right main pulmonary artery. Increased basilar atelectasis in a low volume chest. Negative for pneumothorax left chest tube in place. Electronically Signed   By: Inge Rise M.D.   On: 04/17/2018 10:54   Dg Chest Port 1 View  Result Date: 04/16/2018 CLINICAL DATA:  Post CABG.  Assess tubes and lines. EXAM: PORTABLE CHEST 1 VIEW COMPARISON:  03/25/2018 FINDINGS: Post median sternotomy. Endotracheal tube has tip 5 cm above the carina. Right IJ Swan-Ganz catheter has tip over the main pulmonary artery segment. Enteric tube has tip and side-port over the stomach in the left upper quadrant. Left basilar chest tube is in place. Lungs are somewhat hypoinflated with minimal bibasilar opacification left-greater-than-right likely atelectasis. Suggestion of small amount left pleural fluid. No pneumothorax. Cardiomediastinal silhouette and remainder of the exam is unchanged. IMPRESSION: Minimal bibasilar opacification left-greater-than-right likely atelectasis. Small amount left pleural fluid. Tubes and lines as described. Electronically Signed   By: Marin Olp M.D.   On: 04/16/2018 16:21       Discharge Instructions    Discharge patient   Complete by:  As directed    Discharge disposition:  01-Home or Self Care   Discharge patient date:  04/22/2018      Discharge Medications: Allergies as of 04/22/2018      Reactions   Other Rash   CHG wipes       Medication List    STOP taking these medications   aspirin 81 MG tablet Replaced by:  aspirin 325 MG EC tablet     TAKE these medications   aspirin 325 MG EC tablet Take 1 tablet (325 mg total) by mouth daily. Replaces:  aspirin 81 MG  tablet   furosemide 40 MG tablet Commonly known as:  LASIX Take 1 tablet (40 mg total) by mouth daily.   hydrALAZINE 50 MG tablet Commonly known as:  APRESOLINE Take 50 mg by mouth 2 (two) times daily.   lisinopril 10 MG tablet Commonly known as:  PRINIVIL,ZESTRIL Take 1 tablet (10 mg total) by mouth daily.   metoprolol tartrate 25 MG tablet Commonly known as:  LOPRESSOR Take 0.5 tablets (12.5 mg total) by mouth 2 (two) times daily.   oxyCODONE 5 MG immediate release tablet Commonly known as:  Oxy IR/ROXICODONE Take 1 tablet (5 mg total) by mouth every 6 (six) hours as needed for up to 7 days for severe pain.   potassium chloride SA 20 MEQ tablet Commonly known as:  K-DUR,KLOR-CON Take 1 tablet (20 mEq total) by mouth daily.   rosuvastatin 20  MG tablet Commonly known as:  CRESTOR Take 1 tablet (20 mg total) by mouth at bedtime.     The patient has been discharged on:   1.Beta Blocker:  Yes [ x  ]                              No   [   ]                              If No, reason:  2.Ace Inhibitor/ARB: Yes [   ]                                     No  [    ]                                     If No, reason:  3.Statin:   Yes [  x ]                  No  [   ]                  If No, reason:  4.Ecasa:  Yes  [x   ]                  No   [   ]                  If No, reason:  Follow Up Appointments: Follow-up Information    Herminio Commons, MD. Call.   Specialty:  Cardiology Why:  for a follow up appointment for 2 weeks Contact information: [address] [place] [address] [phone]        Grace Isaac, MD Follow up.   Specialty:  Cardiothoracic Surgery Why:  PA/LAT CXR to be taken (at [facility] which is in the same building as Dr. Everrett Coombe office) one hour prior to office appointment;Appointement is  Contact information: [place] [address] [phone]           Signed: Gaspar Bidding 04/22/2018, 7:47 AM

## 2018-04-20 NOTE — Evaluation (Signed)
Occupational Therapy Evaluation Patient Details Name: Albert Peterson MRN: [id number] DOB: 09/06/1951 Today's Date: 04/20/2018    History of Present Illness 67 y.o. male s/p CABG x4 on 04/16/18 to treat three-vessel coronary artery disease with unstable angina. PMH includes: DM2, HTN, Neck Surgery.    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 Currently pt overall supervision-min guard for functional mobility and min assist for ADL. Pt with good understanding of sternal precautions and able to maintain throughout tasks this session. SpO2 88-94% on RA with activity, DOE 2/4. Began education on energy conservation and pursed lip breathing. Pt planning to d/c home with 24/7 supervision from family initially. Pt would benefit from continued skilled OT to address established goals.    Follow Up Recommendations  No OT follow up;Supervision/Assistance - 24 hour    Equipment Recommendations  Tub/shower seat    Recommendations for Other Services       Precautions / Restrictions Precautions Precautions: Fall;Sternal Precaution Booklet Issued: No Precaution Comments: Pt able to verbally recall sternal precautions and maintained during functional tasks Restrictions Weight Bearing Restrictions: Yes(Sternal precautions)      Mobility Bed Mobility Overal bed mobility: Needs Assistance Bed Mobility: Rolling;Sidelying to Sit Rolling: Supervision Sidelying to sit: Supervision       General bed mobility comments: Supervision for safety. Good technique with use of bed rail  Transfers Overall transfer level: Needs assistance Equipment used: None Transfers: Sit to/from Stand Sit to Stand: Min guard         General transfer comment: Good technique to maintain sternal precautions. Min guard for safety    Balance Overall balance assessment: Needs assistance Sitting-balance support: Feet supported;No upper  extremity supported Sitting balance-Leahy Scale: Good     Standing balance support: No upper extremity supported;During functional activity Standing balance-Leahy Scale: Fair                             ADL either performed or assessed with clinical judgement   ADL Overall ADL's : Needs assistance/impaired Eating/Feeding: Independent;Sitting   Grooming: Supervision/safety;Standing   Upper Body Bathing: Minimal assistance;Sitting   Lower Body Bathing: Minimal assistance;Sit to/from stand   Upper Body Dressing : Minimal assistance;Sitting   Lower Body Dressing: Minimal assistance;Sit to/from stand   Toilet Transfer: Min guard;Ambulation;Regular Glass blower/designer Details (indicate cue type and reason): Min guard without AD         Functional mobility during ADLs: Supervision/safety;Rolling walker General ADL Comments: VSS throughout. SpO2 88-94% on RA with activity. Began education on energy conservation strategies and pursed lip breathing. DOE 2/4. Educated on use of pillow for bracing when coughing     Vision         Perception     Praxis      Pertinent Vitals/Pain Pain Assessment: Faces Faces Pain Scale: Hurts a little bit Pain Location: chest Pain Descriptors / Indicators: Sore Pain Intervention(s): Monitored during session;Repositioned     Hand Dominance     Extremity/Trunk Assessment Upper Extremity Assessment Upper Extremity Assessment: Overall WFL for tasks assessed   Lower Extremity Assessment Lower Extremity Assessment: Defer to PT evaluation   Cervical / Trunk Assessment Cervical / Trunk Assessment: Normal   Communication Communication Communication: No difficulties   Cognition Arousal/Alertness: Awake/alert Behavior During Therapy: WFL for tasks assessed/performed Overall Cognitive Status: Within Functional Limits for tasks assessed  General Comments       Exercises      Shoulder Instructions      Home Living Family/patient expects to be discharged to:: Private residence Living Arrangements: Spouse/significant other Available Help at Discharge: Family;Available 24 hours/day Type of Home: House Home Access: Stairs to enter [employer] of Steps: 3 Entrance Stairs-Rails: Can reach both Home Layout: One level     Bathroom Shower/Tub: Other (comment)(Walk in tub)   Bathroom Toilet: Handicapped height     Home Equipment: None          Prior Functioning/Environment Level of Independence: Independent                 OT Problem List: Decreased activity tolerance;Impaired balance (sitting and/or standing);Decreased knowledge of use of DME or AE;Cardiopulmonary status limiting activity;Decreased knowledge of precautions      OT Treatment/Interventions: Self-care/ADL training;Energy conservation;DME and/or AE instruction;Therapeutic activities;Patient/family education;Balance training    OT Goals(Current goals can be found in the care plan section) Acute Rehab OT Goals Patient Stated Goal: return to work OT Goal Formulation: With patient Time For Goal Achievement: 05/04/18 Potential to Achieve Goals: Good ADL Goals Pt Will Perform Grooming: with supervision;standing(x3 tasks) Pt Will Perform Upper Body Bathing: with supervision;sitting Pt Will Perform Lower Body Bathing: with supervision;sit to/from stand Additional ADL Goal #1: Pt will independently verbally recall 3 energy conservation strategies and utilize during ADL.  OT Frequency: Min 2X/week   Barriers to D/C:            Co-evaluation              AM-PAC PT "6 Clicks" Daily Activity     Outcome Measure Help from another person eating meals?: None Help from another person taking care of personal grooming?: A Little Help from another person toileting, which includes using toliet, bedpan, or urinal?: A Little Help from another person bathing (including washing,  rinsing, drying)?: A Little Help from another person to put on and taking off regular upper body clothing?: A Little Help from another person to put on and taking off regular lower body clothing?: A Little 6 Click Score: 19   End of Session Equipment Utilized During Treatment: Rolling walker Nurse Communication: Mobility status;Other (comment)(Left pt in bathroom)  Activity Tolerance: Patient tolerated treatment well Patient left: Other (comment);with call bell/phone within reach(in bathroom)  OT Visit Diagnosis: Unsteadiness on feet (R26.81);Muscle weakness (generalized) (M62.81)                Time: 3500-9381 OT Time Calculation (min): 20 min Charges:  OT General Charges $OT Visit: 1 Visit OT Evaluation $OT Eval Moderate Complexity: 1 Mod G-Codes:     Desa Rech A. Ulice Brilliant, M.S., OTR/L Acute Rehab Department: [phone]  Binnie Kand 04/20/2018, 8:32 AM

## 2018-04-21 ENCOUNTER — Inpatient Hospital Stay (HOSPITAL_COMMUNITY): Payer: Medicare Other

## 2018-04-21 LAB — CBC
HCT: 32.8 % — ABNORMAL LOW (ref 39.0–52.0)
Hemoglobin: 10.6 g/dL — ABNORMAL LOW (ref 13.0–17.0)
MCH: 29.4 pg (ref 26.0–34.0)
MCHC: 32.3 g/dL (ref 30.0–36.0)
MCV: 91.1 fL (ref 78.0–100.0)
Platelets: 261 10*3/uL (ref 150–400)
RBC: 3.6 MIL/uL — ABNORMAL LOW (ref 4.22–5.81)
RDW: 12 % (ref 11.5–15.5)
WBC: 8.3 10*3/uL (ref 4.0–10.5)

## 2018-04-21 LAB — GLUCOSE, CAPILLARY
GLUCOSE-CAPILLARY: 115 mg/dL — AB (ref 65–99)
GLUCOSE-CAPILLARY: 116 mg/dL — AB (ref 65–99)
Glucose-Capillary: 112 mg/dL — ABNORMAL HIGH (ref 65–99)
Glucose-Capillary: 116 mg/dL — ABNORMAL HIGH (ref 65–99)

## 2018-04-21 LAB — BASIC METABOLIC PANEL
Anion gap: 12 (ref 5–15)
BUN: 21 mg/dL — ABNORMAL HIGH (ref 6–20)
CO2: 27 mmol/L (ref 22–32)
Calcium: 8.8 mg/dL — ABNORMAL LOW (ref 8.9–10.3)
Chloride: 102 mmol/L (ref 101–111)
Creatinine, Ser: 1.34 mg/dL — ABNORMAL HIGH (ref 0.61–1.24)
GFR calc Af Amer: >60 mL/min (ref >60)
GFR calc non Af Amer: 53 mL/min — ABNORMAL LOW (ref >60)
Glucose, Bld: 108 mg/dL — ABNORMAL HIGH (ref 65–99)
Potassium: 3.7 mmol/L (ref 3.5–5.1)
Sodium: 141 mmol/L (ref 135–145)

## 2018-04-21 MED ORDER — POTASSIUM CHLORIDE CRYS ER 20 MEQ PO TBCR
30.0000 meq | EXTENDED_RELEASE_TABLET | Freq: Every day | ORAL | Status: DC
  Administered 2018-04-21 – 2018-04-22 (×2): 30 meq via ORAL
  Filled 2018-04-21 (×2): qty 1

## 2018-04-21 MED ORDER — LISINOPRIL 10 MG PO TABS
10.0000 mg | ORAL_TABLET | Freq: Every day | ORAL | Status: DC
  Administered 2018-04-21 – 2018-04-22 (×2): 10 mg via ORAL
  Filled 2018-04-21 (×2): qty 1

## 2018-04-21 NOTE — Consult Note (Signed)
            [facility] CM Primary Care Navigator  04/21/2018  Albert Peterson 03/24/1951 [id number]   Seenpatient and wife [name]) at the bedsideto identify possible discharge needs. Patientreportshaving "worsening shortness of breath and chest pain"thathad ledto this admission. (coronary artery disease, unstable angina status post CABG- coronary artery bypass graft x 4)  Patientendorses Dr.Xaje Hasanaj with [facility] ashisprimary care provider.   W[facility] reports that [facility] [place] [facility] [employer] toobtain medications without difficulty.  Patient's wife has been managinghis medications at home straight out of the containers.  Patient reports that he was driving prior to admission but wife will providetransportation to hisdoctors'appointments after discharge. His daughter [name]) or next door neighbor [name]) will also be able to provide transportation if needed.  Patientlives at home with wife. Wifewill be his primary caregiver upon discharge.Son [name]) and next door neighbors will be able to provide him assistance as needed.  [place] per wife.  Patientand wife voiced understanding to call primary care provider's officewhen hereturnshomefor a post discharge follow-upvisitwithin1- 2weeksor sooner if needs arise.Patient letter (with PCP's contact number) was provided asareminder.   Discussed withpatient and wiferegarding [facility] CM services available for health management at home but bothdenied any needs or concerns for now.Wife mentioned that patient is aware of ways in maintaining his pre-diabetes status.   Patient and wife verbalized understandingto seekreferral from primary care provider to [name] care management if deemednecessaryand appropriate for anyservices in the future.  [facility] care management information was  provided for future needs thatpatient may have.  Patient however,verbally agreedforEMMIcalls tofollowup hisrecoveryat home.   Referral made for [name] calls after discharge.   For additional questions please contact:  Edwena Felty A. Jahfari Ambers, BSN, RN-BC [facility] PRIMARY CARE Navigator Cell: [phone]

## 2018-04-21 NOTE — Progress Notes (Signed)
Occupational Therapy Treatment Patient Details Name: Albert Peterson MRN: [id number] DOB: 1951-07-18 Today's Date: 04/21/2018    History of present illness 67 y.o. male s/p CABG x4 on 04/16/18 to treat three-vessel coronary artery disease with unstable angina. PMH includes: DM2, HTN, Neck Surgery.    OT comments  Pt demonstrates good progress.  He is able to perform ADLs at mod I - supervision level.  He demonstrates good understanding of precautions.    Follow Up Recommendations  No OT follow up;Supervision/Assistance - 24 hour    Equipment Recommendations  None recommended by OT    Recommendations for Other Services      Precautions / Restrictions Precautions Precautions: Fall;Sternal Precaution Booklet Issued: No Precaution Comments: Pt demonstrates good awareness of sternal precautions        Mobility Bed Mobility               General bed mobility comments: Pt OOB.  He reports he will sleep in recliner at discharge   Transfers Overall transfer level: Modified independent Equipment used: None Transfers: Sit to/from Stand           General transfer comment: good technique stand to sit no support or cues needed    Balance Overall balance assessment: No apparent balance deficits (not formally assessed)   Sitting balance-Leahy Scale: Good       Standing balance-Leahy Scale: Good Standing balance comment: able to ambulate in room no device                           ADL either performed or assessed with clinical judgement   ADL Overall ADL's : Needs assistance/impaired     Grooming: Supervision/safety;Standing       Lower Body Bathing: Supervison/ safety;Sit to/from stand       Lower Body Dressing: Supervision/safety;Sit to/from stand Lower Body Dressing Details (indicate cue type and reason): Pt able to access feet to don/doff socks  Toilet Transfer: Supervision/safety;Regular Toilet;RW   Toileting- Clothing Manipulation and Hygiene:  Sit to/from stand;Moderate assistance Toileting - Clothing Manipulation Details (indicate cue type and reason): Pt reports he is having difficulty accessing peri area for hygiene.  Educated pt on AE for toilet hygien after BM. Pt verbalizing difficulty. Reviewed compensatory techniques     Functional mobility during ADLs: Supervision/safety;Rolling walker General ADL Comments: Pt up ambulating independently in room.      Vision       Perception     Praxis      Cognition Arousal/Alertness: Awake/alert Behavior During Therapy: WFL for tasks assessed/performed Overall Cognitive Status: Within Functional Limits for tasks assessed                                          Exercises     Shoulder Instructions       General Comments HR 101 with activity.  Discussed option for 3in1     Pertinent Vitals/ Pain       Pain Assessment: No/denies pain Faces Pain Scale: No hurt  Home Living Family/patient expects to be discharged to:: Private residence Living Arrangements: Spouse/significant other Available Help at Discharge: Family;Available 24 hours/day Type of Home: House Home Access: Stairs to enter [employer] of Steps: 3 Entrance Stairs-Rails: Can reach both Home Layout: One level     Bathroom Shower/Tub: Other (comment)   Bathroom Toilet: Handicapped height  Bathroom Accessibility: Yes   Home Equipment: None          Prior Functioning/Environment              Frequency  Min 2X/week        Progress Toward Goals  OT Goals(current goals can now be found in the care plan section)  Progress towards OT goals: Progressing toward goals     Plan Discharge plan remains appropriate;Equipment recommendations need to be updated    Co-evaluation                 AM-PAC PT "6 Clicks" Daily Activity     Outcome Measure   Help from another person eating meals?: None Help from another person taking care of personal grooming?:  None Help from another person toileting, which includes using toliet, bedpan, or urinal?: A Little Help from another person bathing (including washing, rinsing, drying)?: A Little Help from another person to put on and taking off regular upper body clothing?: A Little Help from another person to put on and taking off regular lower body clothing?: A Little 6 Click Score: 20    End of Session Equipment Utilized During Treatment: Rolling walker  OT Visit Diagnosis: Unsteadiness on feet (R26.81);Muscle weakness (generalized) (M62.81)   Activity Tolerance Patient tolerated treatment well   Patient Left in chair;with call bell/phone within reach   Nurse Communication Mobility status        Time: 5883-2549 OT Time Calculation (min): 29 min  Charges: OT General Charges $OT Visit: 1 Visit OT Treatments $Self Care/Home Management : 23-37 mins  [facility], OTR/L [phone]    Lucille Passy M 04/21/2018, 1:32 PM

## 2018-04-21 NOTE — Progress Notes (Signed)
Physical Therapy Treatment Patient Details Name: Albert Peterson MRN: [id number] DOB: 1951/09/19 Today's Date: 04/21/2018    History of Present Illness 67 y.o. male s/p CABG x4 on 04/16/18 to treat three-vessel coronary artery disease with unstable angina. PMH includes: DM2, HTN, Neck Surgery.     PT Comments    Patient progressing with mobility and able to demonstrate independent ambulation in hallway with walker.  Feel he should not need follow up HHPT, but will benefit from outpatient cardiac rehab when cleared by MD.   Follow Up Recommendations  Supervision for mobility/OOB(OP cardiac rehab when cleared by MD)     Equipment Recommendations  Rolling walker with 5" wheels    Recommendations for Other Services       Precautions / Restrictions Precautions Precautions: Fall;Sternal Precaution Comments: demonstrates precautions and verbalizes with situational questions    Mobility  Bed Mobility               General bed mobility comments: up ambulating in hallway, demonstrated technique with pt verbalizing understanding  Transfers Overall transfer level: Modified independent Equipment used: None Transfers: Sit to/from Stand           General transfer comment: good technique stand to sit no support or cues needed  Ambulation/Gait Ambulation/Gait assistance: Supervision;Modified independent (Device/Increase time) Ambulation Distance (Feet): 550 Feet Assistive device: Rolling walker (2 wheeled) Gait Pattern/deviations: Step-through pattern     General Gait Details: up ambulating in hallway when PT came and reports already walked about 400'. agreeable to practice stairs   Stairs Stairs: Yes Stairs assistance: Min assist Stair Management: Step to pattern;Forwards;No rails(hand hold assist) Number of Stairs: 3 General stair comments: demonstrated step to then step through to ascend, then step to for descending, cues for step to sequence for safety  initially   Wheelchair Mobility    Modified Rankin (Stroke Patients Only)       Balance Overall balance assessment: No apparent balance deficits (not formally assessed)   Sitting balance-Leahy Scale: Good       Standing balance-Leahy Scale: Good Standing balance comment: able to ambulate in room no device                            Cognition Arousal/Alertness: Awake/alert Behavior During Therapy: WFL for tasks assessed/performed Overall Cognitive Status: Within Functional Limits for tasks assessed                                        Exercises      General Comments General comments (skin integrity, edema, etc.): encouraged to walk again at least once more today, used incentive spirometer to 1500 with good technique x 5 reps with some sputum production      Pertinent Vitals/Pain Faces Pain Scale: No hurt    Home Living                      Prior Function            PT Goals (current goals can now be found in the care plan section) Progress towards PT goals: Progressing toward goals    Frequency    Min 3X/week      PT Plan Current plan remains appropriate    Co-evaluation              AM-PAC PT "6 Clicks" Daily  Activity  Outcome Measure  Difficulty turning over in bed (including adjusting bedclothes, sheets and blankets)?: Unable Difficulty moving from lying on back to sitting on the side of the bed? : A Lot Difficulty sitting down on and standing up from a chair with arms (e.g., wheelchair, bedside commode, etc,.)?: A Little Help needed moving to and from a bed to chair (including a wheelchair)?: A Little Help needed walking in hospital room?: A Little Help needed climbing 3-5 steps with a railing? : A Little 6 Click Score: 15    End of Session     Patient left: with call bell/phone within reach;in chair   PT Visit Diagnosis: Unsteadiness on feet (R26.81);Difficulty in walking, not elsewhere  classified (R26.2)     Time: 2423-5361 PT Time Calculation (min) (ACUTE ONLY): 11 min  Charges:  $Gait Training: 8-22 mins                    G CodesMagda Kiel, [place] [phone] 04/21/2018    Reginia Naas 04/21/2018, 11:44 AM

## 2018-04-21 NOTE — Care Management Important Message (Signed)
Important Message  Patient Details  Name: Albert Peterson MRN: [id number] Date of Birth: Apr 16, 1951   Medicare Important Message Given:  Yes    Barb Merino Linsie Lupo 04/21/2018, 2:45 PM

## 2018-04-21 NOTE — Progress Notes (Signed)
Pacing wires removed per order. Patient instructed on bedrest and vital sign protocol initiated.

## 2018-04-21 NOTE — Progress Notes (Addendum)
[address]       [place] [address]             [phone]      5 Days Post-Op Procedure(s) (LRB): CORONARY ARTERY BYPASS GRAFTING (CABG) x 4, ON PUMP, LIMA to LAD, SVG to DIAGONAL, SVG to CIRCUMFLEX, and SVG to PDA, USING LEFT INTERNAL MAMMARY ARTERY AND RIGHT GREATER SAPHENOUS VEIN HARVESTED ENDOSCOPICALLY (N/A) TRANSESOPHAGEAL ECHOCARDIOGRAM (TEE) (N/A) Subjective: Feeling some SOB  Objective: Vital signs in last 24 hours: Temp:  [98.3 F (36.8 C)-99 F (37.2 C)] 98.3 F (36.8 C) (05/12 2143) Pulse Rate:  [83-103] 83 (05/12 2143) Cardiac Rhythm: Normal sinus rhythm (05/12 2100) Resp:  [8-23] 18 (05/12 2143) BP: (137-155)/(73-123) 144/89 (05/12 2143) SpO2:  [88 %-97 %] 96 % (05/12 2143) Weight:  [98.4 kg (217 lb)] 98.4 kg (217 lb) (05/13 0500)  Hemodynamic parameters for last 24 hours:    Intake/Output from previous day: 05/12 0701 - 05/13 0700 In: 120 [P.O.:120] Out: 150 [Urine:150] Intake/Output this shift: No intake/output data recorded.  General appearance: alert, cooperative and no distress Heart: regular rate and rhythm Lungs: mild basilar crackles Abdomen: benign Extremities: + edema Wound: incis healing well  Lab Results: Recent Labs    04/20/18 0515 04/21/18 0337  WBC 7.8 8.3  HGB 9.3* 10.6*  HCT 29.4* 32.8*  PLT 202 261   BMET:  Recent Labs    04/20/18 0515 04/21/18 0337  NA 143 141  K 3.7 3.7  CL 105 102  CO2 30 27  GLUCOSE 93 108*  BUN 25* 21*  CREATININE 1.32* 1.34*  CALCIUM 8.5* 8.8*    PT/INR: No results for input(s): LABPROT, INR in the last 72 hours. ABG    Component Value Date/Time   PHART 7.381 04/16/2018 2222   HCO3 26.1 04/16/2018 2222   TCO2 26 04/17/2018 1609   ACIDBASEDEF 4.0 (H) 04/16/2018 1832   O2SAT 92.0 04/16/2018 2222   CBG (last 3)  Recent Labs    04/20/18 1741 04/20/18 2216 04/21/18 0623  GLUCAP 133* 94 112*    Meds Scheduled Meds: . acetaminophen  1,000 mg Oral Q6H   Or  . acetaminophen (TYLENOL) oral liquid 160 mg/5 mL  1,000 mg Per Tube Q6H  . aspirin EC  325 mg Oral Daily   Or  . aspirin  324 mg Per Tube Daily  . bisacodyl  10 mg Oral Daily   Or  . bisacodyl  10 mg Rectal Daily  . docusate sodium  200 mg Oral Daily  . enoxaparin (LOVENOX) injection  30 mg Subcutaneous QHS  . furosemide  40 mg Oral Daily  . insulin aspart  0-24 Units Subcutaneous TID AC & HS  . insulin aspart  0-5 Units Subcutaneous QHS  . mouth rinse  15 mL Mouth Rinse BID  . metoprolol tartrate  12.5 mg Oral BID   Or  . metoprolol tartrate  12.5 mg Per Tube BID  . moving right along book   Does not apply Once  . pantoprazole  40 mg Oral Daily  . potassium chloride  20 mEq Oral Daily  . rosuvastatin  40 mg Oral q1800  . sodium chloride flush  10-40 mL Intracatheter Q12H  . sodium chloride flush  3 mL Intravenous Q12H  . sodium chloride flush  3 mL Intravenous Q12H  . sodium chloride flush  3 mL Intravenous Q12H   Continuous Infusions: . sodium chloride    . sodium chloride    .  sodium chloride     PRN Meds:.sodium chloride, sodium chloride, bisacodyl **OR** bisacodyl, magnesium hydroxide, metoprolol tartrate, oxyCODONE, promethazine, sodium chloride flush, sodium chloride flush, sodium chloride flush, sodium chloride flush, traMADol  Xrays Dg Chest 2 View  Result Date: 04/21/2018 CLINICAL DATA:  Follow-up coronary bypass grafting EXAM: CHEST - 2 VIEW COMPARISON:  04/19/2018 FINDINGS: Cardiac shadow is stable. Postsurgical changes are again seen. Lungs are well aerated bilaterally. Persistent small left pleural effusion and basilar atelectasis is noted. Improved aeration is noted on the right. Small right effusion remains posteriorly. No acute bony abnormality is noted. IMPRESSION: Small bilateral pleural effusions with left basilar atelectasis. Electronically Signed   By: [name] M.D.   On: 04/21/2018 07:14    Assessment/Plan: S/P Procedure(s) (LRB): CORONARY  ARTERY BYPASS GRAFTING (CABG) x 4, ON PUMP, LIMA to LAD, SVG to DIAGONAL, SVG to CIRCUMFLEX, and SVG to PDA, USING LEFT INTERNAL MAMMARY ARTERY AND RIGHT GREATER SAPHENOUS VEIN HARVESTED ENDOSCOPICALLY (N/A) TRANSESOPHAGEAL ECHOCARDIOGRAM (TEE) (N/A)  1 hemodyn stable but hypertensive in NSR. Will add ACE-I 2 Volume overload, cont diuretics 3 labs stable 4 CXR with small effus/atx- cont pulm toilet 5 HGB A1C  5.9-  sugars well controlled 6 push rehab 7 wean O2 off  8 d/c epw's  LOS: 7 days    John Giovanni 04/21/2018 Improving, renal function stable  Home 1-2 days  I have seen and examined Roanna Banning and agree with the above assessment  and plan.  Grace Isaac MD [facility] [phone] Office [phone] 04/21/2018 10:55 AM

## 2018-04-21 NOTE — Progress Notes (Signed)
CARDIAC REHAB PHASE I   PRE:  Rate/Rhythm: 88 SR    BP: sitting 181/97    SaO2: 95 RA  MODE:  Ambulation: 900 ft   POST:  Rate/Rhythm: 107 ST    BP: sitting 174/91     SaO2: 96 RA  Pt c/o lack of sleep. Moving independently getting out of bed and using RW. No c/o, increased distance. SaO2 stable on RA but BP elevated this am. RN aware and giving meds. To recliner. Pt advised not to use arm of recliner to elevate his legs however sts he's fine with it.  [place], ACSM 04/21/2018 9:19 AM

## 2018-04-21 NOTE — Plan of Care (Signed)
Care plans reviewed and patient is progressing.  

## 2018-04-22 LAB — GLUCOSE, CAPILLARY
GLUCOSE-CAPILLARY: 110 mg/dL — AB (ref 65–99)
Glucose-Capillary: 114 mg/dL — ABNORMAL HIGH (ref 65–99)

## 2018-04-22 MED ORDER — HYDRALAZINE HCL 50 MG PO TABS
50.0000 mg | ORAL_TABLET | Freq: Two times a day (BID) | ORAL | Status: DC
  Administered 2018-04-22: 50 mg via ORAL
  Filled 2018-04-22: qty 1

## 2018-04-22 MED ORDER — FUROSEMIDE 40 MG PO TABS: 40.0000 mg | ORAL_TABLET | Freq: Every day | ORAL | 0 refills | Status: DC

## 2018-04-22 MED ORDER — LISINOPRIL 10 MG PO TABS: 10.0000 mg | ORAL_TABLET | Freq: Every day | ORAL | 1 refills | Status: DC

## 2018-04-22 MED ORDER — OXYCODONE HCL 5 MG PO TABS: 5.0000 mg | ORAL_TABLET | Freq: Four times a day (QID) | ORAL | 0 refills | Status: AC | PRN

## 2018-04-22 MED ORDER — ROSUVASTATIN CALCIUM 20 MG PO TABS: 20.0000 mg | ORAL_TABLET | Freq: Every day | ORAL | 1 refills | Status: AC

## 2018-04-22 MED ORDER — METOPROLOL TARTRATE 25 MG PO TABS: 12.5000 mg | ORAL_TABLET | Freq: Two times a day (BID) | ORAL | 1 refills | Status: AC

## 2018-04-22 MED ORDER — POTASSIUM CHLORIDE CRYS ER 20 MEQ PO TBCR: 20.0000 meq | EXTENDED_RELEASE_TABLET | Freq: Every day | ORAL | 0 refills | Status: DC

## 2018-04-22 MED ORDER — ASPIRIN 325 MG PO TBEC: 325.0000 mg | DELAYED_RELEASE_TABLET | Freq: Every day | ORAL | Status: AC

## 2018-04-22 NOTE — Care Management Note (Signed)
Case Management Note Marvetta Gibbons RN, BSN Unit 4E-Case Manager [phone]  Patient Details  Name: SEANN GENTHER MRN: [id number] Date of Birth: [birth year]  Subjective/Objective:   Pt admitted with [place], s/p CABGx4                 Action/Plan: PTA pt lived at home with spouse- plan to return home with spouse- per LLOS/QC mtg pt deemed appropriate for [place] order has been placed- pt for d/c home today- spoke with pt and wife at bedside regarding [facility] and Na[facility]ace[facility] program- they are agreeable to [facility] f/u with [facility]- notified Butch Penny with [facility] for [facility] services- no other CM needs noted for transition to home.   Expected Discharge Date:  04/22/18               Expected Discharge Plan:  [place]  In-House Referral:  NA  Discharge planning Services  CM Consult  Post Acute Care Choice:  Home Health Choice offered to:  Patient, Spouse  DME Arranged:    DME Agency:     HH Arranged:  RN, Disease Management [place] Agency:  [facility]  Status of Service:  Completed, signed off  If discussed at [place] of Stay Meetings, dates discussed:  5/14  Discharge Disposition: home/home health   Additional Comments:  Dawayne Patricia, RN 04/22/2018, 10:45 AM

## 2018-04-22 NOTE — Progress Notes (Signed)
[address]       [place] [address]             [phone]      6 Days Post-Op Procedure(s) (LRB): CORONARY ARTERY BYPASS GRAFTING (CABG) x 4, ON PUMP, LIMA to LAD, SVG to DIAGONAL, SVG to CIRCUMFLEX, and SVG to PDA, USING LEFT INTERNAL MAMMARY ARTERY AND RIGHT GREATER SAPHENOUS VEIN HARVESTED ENDOSCOPICALLY (N/A) TRANSESOPHAGEAL ECHOCARDIOGRAM (TEE) (N/A) Subjective: conts to feel better, now off O2, BP remains elevated, will resume hydralazine  Objective: Vital signs in last 24 hours: Temp:  [98.4 F (36.9 C)-99.3 F (37.4 C)] 98.4 F (36.9 C) (05/14 0539) Pulse Rate:  [92-93] 93 (05/13 0959) Cardiac Rhythm: Normal sinus rhythm (05/13 1900) Resp:  [17-24] 20 (05/13 2037) BP: (141-176)/(73-111) 141/78 (05/13 2037) SpO2:  [92 %-95 %] 95 % (05/13 2037) Weight:  [97.1 kg (214 lb)] 97.1 kg (214 lb) (05/14 0300)  Hemodynamic parameters for last 24 hours:    Intake/Output from previous day: 05/13 0701 - 05/14 0700 In: 890 [P.O.:890] Out: 500 [Urine:500] Intake/Output this shift: No intake/output data recorded.  General appearance: alert, cooperative and no distress Heart: regular rate and rhythm Lungs: clear to auscultation bilaterally Abdomen: benign Extremities: + edema Wound: incis healing well  Lab Results: Recent Labs    04/20/18 0515 04/21/18 0337  WBC 7.8 8.3  HGB 9.3* 10.6*  HCT 29.4* 32.8*  PLT 202 261   BMET:  Recent Labs    04/20/18 0515 04/21/18 0337  NA 143 141  K 3.7 3.7  CL 105 102  CO2 30 27  GLUCOSE 93 108*  BUN 25* 21*  CREATININE 1.32* 1.34*  CALCIUM 8.5* 8.8*    PT/INR: No results for input(s): LABPROT, INR in the last 72 hours. ABG    Component Value Date/Time   PHART 7.381 04/16/2018 2222   HCO3 26.1 04/16/2018 2222   TCO2 26 04/17/2018 1609   ACIDBASEDEF 4.0 (H) 04/16/2018 1832   O2SAT 92.0 04/16/2018 2222   CBG (last 3)  Recent Labs    04/21/18 1643 04/21/18 2109 04/22/18 0627  GLUCAP 116*  116* 110*    Meds Scheduled Meds: . aspirin EC  325 mg Oral Daily   Or  . aspirin  324 mg Per Tube Daily  . bisacodyl  10 mg Oral Daily   Or  . bisacodyl  10 mg Rectal Daily  . docusate sodium  200 mg Oral Daily  . enoxaparin (LOVENOX) injection  30 mg Subcutaneous QHS  . furosemide  40 mg Oral Daily  . insulin aspart  0-24 Units Subcutaneous TID AC & HS  . insulin aspart  0-5 Units Subcutaneous QHS  . lisinopril  10 mg Oral Daily  . mouth rinse  15 mL Mouth Rinse BID  . metoprolol tartrate  12.5 mg Oral BID   Or  . metoprolol tartrate  12.5 mg Per Tube BID  . pantoprazole  40 mg Oral Daily  . potassium chloride  30 mEq Oral Daily  . rosuvastatin  40 mg Oral q1800  . sodium chloride flush  10-40 mL Intracatheter Q12H  . sodium chloride flush  3 mL Intravenous Q12H  . sodium chloride flush  3 mL Intravenous Q12H  . sodium chloride flush  3 mL Intravenous Q12H   Continuous Infusions: . sodium chloride    . sodium chloride    . sodium chloride     PRN Meds:.sodium chloride, sodium chloride, bisacodyl **OR** bisacodyl, magnesium  hydroxide, metoprolol tartrate, oxyCODONE, promethazine, sodium chloride flush, sodium chloride flush, sodium chloride flush, sodium chloride flush, traMADol  Xrays Dg Chest 2 View  Result Date: 04/21/2018 CLINICAL DATA:  Follow-up coronary bypass grafting EXAM: CHEST - 2 VIEW COMPARISON:  04/19/2018 FINDINGS: Cardiac shadow is stable. Postsurgical changes are again seen. Lungs are well aerated bilaterally. Persistent small left pleural effusion and basilar atelectasis is noted. Improved aeration is noted on the right. Small right effusion remains posteriorly. No acute bony abnormality is noted. IMPRESSION: Small bilateral pleural effusions with left basilar atelectasis. Electronically Signed   By: [name] M.D.   On: 04/21/2018 07:14    Assessment/Plan: S/P Procedure(s) (LRB): CORONARY ARTERY BYPASS GRAFTING (CABG) x 4, ON PUMP, LIMA to LAD, SVG  to DIAGONAL, SVG to CIRCUMFLEX, and SVG to PDA, USING LEFT INTERNAL MAMMARY ARTERY AND RIGHT GREATER SAPHENOUS VEIN HARVESTED ENDOSCOPICALLY (N/A) TRANSESOPHAGEAL ECHOCARDIOGRAM (TEE) (N/A)   1 doing well 2 restart hydrazine 3 cont low dose ace-Inhibitor, cont lasix x 1 week for some edema/volume overloaad 4 no new labs 5 stable for discharge   LOS: 8 days    John Giovanni 04/22/2018

## 2018-04-22 NOTE — Progress Notes (Signed)
Ed completed with pt and wife. Good reception. Will send referral to [facility].  [phone] Yves Dill CES, ACSM 11:37 AM 04/22/2018

## 2018-04-23 ENCOUNTER — Telehealth: Payer: Self-pay

## 2018-04-23 DIAGNOSIS — K5790 Diverticulosis of intestine, part unspecified, without perforation or abscess without bleeding: Secondary | ICD-10-CM | POA: Diagnosis not present

## 2018-04-23 DIAGNOSIS — E119 Type 2 diabetes mellitus without complications: Secondary | ICD-10-CM | POA: Diagnosis not present

## 2018-04-23 DIAGNOSIS — Z48812 Encounter for surgical aftercare following surgery on the circulatory system: Secondary | ICD-10-CM | POA: Diagnosis not present

## 2018-04-23 DIAGNOSIS — E785 Hyperlipidemia, unspecified: Secondary | ICD-10-CM | POA: Diagnosis not present

## 2018-04-23 DIAGNOSIS — Z87891 Personal history of nicotine dependence: Secondary | ICD-10-CM | POA: Diagnosis not present

## 2018-04-23 DIAGNOSIS — I25110 Atherosclerotic heart disease of native coronary artery with unstable angina pectoris: Secondary | ICD-10-CM | POA: Diagnosis not present

## 2018-04-23 DIAGNOSIS — Z7982 Long term (current) use of aspirin: Secondary | ICD-10-CM | POA: Diagnosis not present

## 2018-04-23 DIAGNOSIS — Z951 Presence of aortocoronary bypass graft: Secondary | ICD-10-CM | POA: Diagnosis not present

## 2018-04-23 DIAGNOSIS — I10 Essential (primary) hypertension: Secondary | ICD-10-CM | POA: Diagnosis not present

## 2018-04-23 DIAGNOSIS — L409 Psoriasis, unspecified: Secondary | ICD-10-CM | POA: Diagnosis not present

## 2018-04-23 NOTE — Telephone Encounter (Signed)
Called and left a message for [employer], nursing with [facility].  Approving verbal orders for home health to come out to the home following discharge from the hospital post CABG.  Approval for nursing 2 times a week for 4-5 weeks for monitoring.  Awaiting call or faxed paperwork.

## 2018-04-25 DIAGNOSIS — I25110 Atherosclerotic heart disease of native coronary artery with unstable angina pectoris: Secondary | ICD-10-CM | POA: Diagnosis not present

## 2018-04-25 DIAGNOSIS — K5790 Diverticulosis of intestine, part unspecified, without perforation or abscess without bleeding: Secondary | ICD-10-CM | POA: Diagnosis not present

## 2018-04-25 DIAGNOSIS — Z48812 Encounter for surgical aftercare following surgery on the circulatory system: Secondary | ICD-10-CM | POA: Diagnosis not present

## 2018-04-25 DIAGNOSIS — I10 Essential (primary) hypertension: Secondary | ICD-10-CM | POA: Diagnosis not present

## 2018-04-25 DIAGNOSIS — E119 Type 2 diabetes mellitus without complications: Secondary | ICD-10-CM | POA: Diagnosis not present

## 2018-04-25 DIAGNOSIS — L409 Psoriasis, unspecified: Secondary | ICD-10-CM | POA: Diagnosis not present

## 2018-04-28 DIAGNOSIS — I952 Hypotension due to drugs: Secondary | ICD-10-CM | POA: Diagnosis not present

## 2018-04-29 DIAGNOSIS — I10 Essential (primary) hypertension: Secondary | ICD-10-CM | POA: Diagnosis not present

## 2018-04-29 DIAGNOSIS — I25110 Atherosclerotic heart disease of native coronary artery with unstable angina pectoris: Secondary | ICD-10-CM | POA: Diagnosis not present

## 2018-04-29 DIAGNOSIS — K5790 Diverticulosis of intestine, part unspecified, without perforation or abscess without bleeding: Secondary | ICD-10-CM | POA: Diagnosis not present

## 2018-04-29 DIAGNOSIS — E119 Type 2 diabetes mellitus without complications: Secondary | ICD-10-CM | POA: Diagnosis not present

## 2018-04-29 DIAGNOSIS — L409 Psoriasis, unspecified: Secondary | ICD-10-CM | POA: Diagnosis not present

## 2018-04-29 DIAGNOSIS — Z48812 Encounter for surgical aftercare following surgery on the circulatory system: Secondary | ICD-10-CM | POA: Diagnosis not present

## 2018-05-01 DIAGNOSIS — E119 Type 2 diabetes mellitus without complications: Secondary | ICD-10-CM | POA: Diagnosis not present

## 2018-05-01 DIAGNOSIS — I10 Essential (primary) hypertension: Secondary | ICD-10-CM | POA: Diagnosis not present

## 2018-05-01 DIAGNOSIS — I25110 Atherosclerotic heart disease of native coronary artery with unstable angina pectoris: Secondary | ICD-10-CM | POA: Diagnosis not present

## 2018-05-01 DIAGNOSIS — K5790 Diverticulosis of intestine, part unspecified, without perforation or abscess without bleeding: Secondary | ICD-10-CM | POA: Diagnosis not present

## 2018-05-01 DIAGNOSIS — Z48812 Encounter for surgical aftercare following surgery on the circulatory system: Secondary | ICD-10-CM | POA: Diagnosis not present

## 2018-05-01 DIAGNOSIS — L409 Psoriasis, unspecified: Secondary | ICD-10-CM | POA: Diagnosis not present

## 2018-05-06 DIAGNOSIS — I25110 Atherosclerotic heart disease of native coronary artery with unstable angina pectoris: Secondary | ICD-10-CM | POA: Diagnosis not present

## 2018-05-06 DIAGNOSIS — I10 Essential (primary) hypertension: Secondary | ICD-10-CM | POA: Diagnosis not present

## 2018-05-06 DIAGNOSIS — L409 Psoriasis, unspecified: Secondary | ICD-10-CM | POA: Diagnosis not present

## 2018-05-06 DIAGNOSIS — Z48812 Encounter for surgical aftercare following surgery on the circulatory system: Secondary | ICD-10-CM | POA: Diagnosis not present

## 2018-05-06 DIAGNOSIS — K5790 Diverticulosis of intestine, part unspecified, without perforation or abscess without bleeding: Secondary | ICD-10-CM | POA: Diagnosis not present

## 2018-05-06 DIAGNOSIS — E119 Type 2 diabetes mellitus without complications: Secondary | ICD-10-CM | POA: Diagnosis not present

## 2018-05-09 ENCOUNTER — Ambulatory Visit: Payer: Medicare Other | Admitting: Cardiovascular Disease

## 2018-05-12 ENCOUNTER — Ambulatory Visit (INDEPENDENT_AMBULATORY_CARE_PROVIDER_SITE_OTHER): Payer: Medicare Other | Admitting: Cardiovascular Disease

## 2018-05-12 ENCOUNTER — Encounter: Payer: Self-pay | Admitting: Cardiovascular Disease

## 2018-05-12 ENCOUNTER — Encounter: Payer: Self-pay | Admitting: *Deleted

## 2018-05-12 VITALS — BP 122/64 | HR 78 | Ht 68.5 in | Wt 209.0 lb

## 2018-05-12 DIAGNOSIS — I208 Other forms of angina pectoris: Secondary | ICD-10-CM

## 2018-05-12 DIAGNOSIS — I10 Essential (primary) hypertension: Secondary | ICD-10-CM

## 2018-05-12 DIAGNOSIS — I25708 Atherosclerosis of coronary artery bypass graft(s), unspecified, with other forms of angina pectoris: Secondary | ICD-10-CM

## 2018-05-12 DIAGNOSIS — Z9289 Personal history of other medical treatment: Secondary | ICD-10-CM | POA: Diagnosis not present

## 2018-05-12 DIAGNOSIS — Z951 Presence of aortocoronary bypass graft: Secondary | ICD-10-CM

## 2018-05-12 NOTE — Progress Notes (Signed)
SUBJECTIVE: The patient presents for follow-up after undergoing four-vessel CABG on 04/16/2018 with a LIMA to the LAD, SVG to the diagonal, SVG to the obtuse marginal, and SVG to the PDA.  The patient denies any symptoms of chest pain, palpitations, shortness of breath, lightheadedness, dizziness, leg swelling, orthopnea, PND, and syncope.  He is able to walk uphill and does so 3 times per day without chest pain or shortness of breath.  Soc Hx: His wife works at the [employer]. He is originally from [place], [place].   Review of Systems: As per "subjective", otherwise negative.  Allergies  Allergen Reactions  . Other Rash    CHG wipes     Current Outpatient Medications  Medication Sig Dispense Refill  . aspirin EC 325 MG EC tablet Take 1 tablet (325 mg total) by mouth daily.    . benazepril (LOTENSIN) 5 MG tablet Take 5 mg by mouth daily.    . hydrALAZINE (APRESOLINE) 25 MG tablet Take 25 mg by mouth 2 (two) times daily.    . metoprolol tartrate (LOPRESSOR) 25 MG tablet Take 0.5 tablets (12.5 mg total) by mouth 2 (two) times daily. 30 tablet 1  . rosuvastatin (CRESTOR) 20 MG tablet Take 1 tablet (20 mg total) by mouth at bedtime. 30 tablet 1   No current facility-administered medications for this visit.     Past Medical History:  Diagnosis Date  . Benign essential hypertension   . Diverticulitis   . DM2 (diabetes mellitus, type 2) ([facility])   . Eczema   . Hematuria   . Hypercholesteremia   . Hyperlipidemia   . Nephrolithiasis    Per patient "kidney cysts" not stones  . Psoriasis    right leg  . SOB (shortness of breath)     Past Surgical History:  Procedure Laterality Date  . BIOPSY  07/26/2016   Procedure: BIOPSY;  Surgeon: Daneil Dolin, MD;  [facility];  Service: Endoscopy;;  gastric   . CATARACT EXTRACTION W/PHACO Right 12/17/2013   Procedure: CATARACT EXTRACTION PHACO AND INTRAOCULAR LENS PLACEMENT (IOC) CDE=13.21;  Surgeon: Tonny Branch, MD;   Location: [facility];  Service: Ophthalmology;  Laterality: Right;  . COLONOSCOPY  03/2018   At [facility] 2012 or earlier; records requested  . CORONARY ARTERY BYPASS GRAFT N/A 04/16/2018   Procedure: CORONARY ARTERY BYPASS GRAFTING (CABG) x 4, ON PUMP, LIMA to LAD, SVG to DIAGONAL, SVG to CIRCUMFLEX, and SVG to PDA, USING LEFT INTERNAL MAMMARY ARTERY AND RIGHT GREATER SAPHENOUS VEIN HARVESTED ENDOSCOPICALLY;  Surgeon: Grace Isaac, MD;  Location: [facility];  Service: Open Heart Surgery;  Laterality: N/A;  . ESOPHAGOGASTRODUODENOSCOPY N/A 07/26/2016   Procedure: ESOPHAGOGASTRODUODENOSCOPY (EGD);  Surgeon: Daneil Dolin, MD;  [facility];  Service: Endoscopy;  Laterality: N/A;  730   . LEFT HEART CATH    . LEFT HEART CATH AND CORONARY ANGIOGRAPHY N/A 04/14/2018   Procedure: LEFT HEART CATH AND CORONARY ANGIOGRAPHY;  Surgeon: Martinique, Peter M, MD;  Location: [facility];  Service: Cardiovascular;  Laterality: N/A;  . NECK SURGERY    . TEE WITHOUT CARDIOVERSION N/A 04/16/2018   Procedure: TRANSESOPHAGEAL ECHOCARDIOGRAM (TEE);  Surgeon: Grace Isaac, MD;  Location: [facility];  Service: Open Heart Surgery;  Laterality: N/A;  . TRIGGER FINGER RELEASE      Social History   Socioeconomic History  . Marital status: Married    Spouse name: Not on file  . Number of children: 1  .  Years of education: Not on file  . Highest education level: Not on file  Occupational History  . Occupation: shipping and receiving   Social Needs  . Financial resource strain: Not on file  . Food insecurity:    Worry: Not on file    Inability: Not on file  . Transportation needs:    Medical: Not on file    Non-medical: Not on file  Tobacco Use  . Smoking status: Former Smoker    Packs/day: 1.50    Years: 15.00    Pack years: 22.50    Types: Cigarettes    Start date: 12/11/1963    Last attempt to quit: 12/10/1993    Years since quitting: 24.4  . Smokeless tobacco: Never Used  . Tobacco comment: Quit  smoking for 13 years at one point  Substance and Sexual Activity  . Alcohol use: No    Alcohol/week: 0.0 oz    Comment: None currently; previous "youth experience" as a teenager  . Drug use: No  . Sexual activity: Not on file  Lifestyle  . Physical activity:    Days per week: Not on file    Minutes per session: Not on file  . Stress: Not on file  Relationships  . Social connections:    Talks on phone: Not on file    Gets together: Not on file    Attends religious service: Not on file    Active member of club or organization: Not on file    Attends meetings of clubs or organizations: Not on file    Relationship status: Not on file  . Intimate partner violence:    Fear of current or ex partner: Not on file    Emotionally abused: Not on file    Physically abused: Not on file    Forced sexual activity: Not on file  Other Topics Concern  . Not on file  Social History Narrative   Originally from [place]. He moved to [place] in 1973. He has only lived here and [place]. He has traveled to [place], [place], [place], [place], [place], and states along the way to [place]. No international travel. 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 No history of exposure to exotic lumber. Has a dog at home. No bird, mold, or hot tub exposure. 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 No known asbestos exposure.      Vitals:   05/12/18 0823  BP: 122/64  Pulse: 78  SpO2: 97%  Weight: 209 lb (94.8 kg)  Height: 5' 8.5" (1.74 m)    Wt Readings from Last 3 Encounters:  05/12/18 209 lb (94.8 kg)  04/22/18 214 lb (97.1 kg)  04/07/18 221 lb (100.2 kg)     PHYSICAL EXAM General: NAD HEENT: Normal. Neck: No JVD, no thyromegaly. Lungs: Clear to auscultation bilaterally with normal respiratory effort. CV: Regular rate and rhythm, normal S1/S2, no S3/S4, no murmur. No pretibial or periankle edema.  No carotid bruit.   Abdomen: Soft, nontender, no distention.  Neurologic: Alert  and oriented.  Psych: Normal affect. Skin: Normal. Musculoskeletal: No gross deformities.    ECG: Most recent ECG reviewed.   Labs: Lab Results  Component Value Date/Time   K 3.7 04/21/2018 03:37 AM   BUN 21 (H) 04/21/2018 03:37 AM   CREATININE 1.34 (H) 04/21/2018 03:37 AM   ALT 17 04/15/2018 06:10 AM   HGB 10.6 (L) 04/21/2018 03:37 AM     Lipids: No results found  for: LDLCALC, LDLDIRECT, CHOL, TRIG, HDL     ASSESSMENT AND PLAN: 1.  Coronary artery disease status post four-vessel CABG: Symptomatically stable.  Continue aspirin, metoprolol, benazepril, and rosuvastatin.  I will make a referral to cardiac rehab.  I will obtain a copy of lipids from PCP.  2.  Hypertension: Pressure is controlled.  No changes to therapy.  3.  Anemia: Hemoglobin 10.6 on 04/21/2018.    Disposition: Follow up 3 months   Kate Sable, M.D., F.A.C.C.

## 2018-05-12 NOTE — Patient Instructions (Addendum)
Medication Instructions:  Continue all current medications.  Labwork: none  Testing/Procedures: none  Follow-Up: 3 months   Any Other Special Instructions Will Be Listed Below (If Applicable). You have been referred to:  Cardiac rehab.   If you need a refill on your cardiac medications before your next appointment, please call your pharmacy.

## 2018-05-13 DIAGNOSIS — K5790 Diverticulosis of intestine, part unspecified, without perforation or abscess without bleeding: Secondary | ICD-10-CM | POA: Diagnosis not present

## 2018-05-13 DIAGNOSIS — E119 Type 2 diabetes mellitus without complications: Secondary | ICD-10-CM | POA: Diagnosis not present

## 2018-05-13 DIAGNOSIS — L409 Psoriasis, unspecified: Secondary | ICD-10-CM | POA: Diagnosis not present

## 2018-05-13 DIAGNOSIS — I10 Essential (primary) hypertension: Secondary | ICD-10-CM | POA: Diagnosis not present

## 2018-05-13 DIAGNOSIS — I25110 Atherosclerotic heart disease of native coronary artery with unstable angina pectoris: Secondary | ICD-10-CM | POA: Diagnosis not present

## 2018-05-13 DIAGNOSIS — Z48812 Encounter for surgical aftercare following surgery on the circulatory system: Secondary | ICD-10-CM | POA: Diagnosis not present

## 2018-05-15 ENCOUNTER — Other Ambulatory Visit: Payer: Self-pay | Admitting: Cardiothoracic Surgery

## 2018-05-15 DIAGNOSIS — Z951 Presence of aortocoronary bypass graft: Secondary | ICD-10-CM

## 2018-05-19 ENCOUNTER — Ambulatory Visit
Admission: RE | Admit: 2018-05-19 | Discharge: 2018-05-19 | Disposition: A | Discharge status: Home / Self Care | Payer: Medicare Other | Source: Ambulatory Visit | Attending: Cardiothoracic Surgery | Admitting: Cardiothoracic Surgery

## 2018-05-19 ENCOUNTER — Other Ambulatory Visit: Payer: Self-pay

## 2018-05-19 ENCOUNTER — Ambulatory Visit (INDEPENDENT_AMBULATORY_CARE_PROVIDER_SITE_OTHER): Payer: Self-pay | Admitting: Surgical

## 2018-05-19 ENCOUNTER — Telehealth: Payer: Self-pay

## 2018-05-19 VITALS — BP 127/82 | HR 80 | Resp 18 | Ht 68.5 in | Wt 206.0 lb

## 2018-05-19 DIAGNOSIS — J90 Pleural effusion, not elsewhere classified: Secondary | ICD-10-CM | POA: Diagnosis not present

## 2018-05-19 DIAGNOSIS — Z951 Presence of aortocoronary bypass graft: Secondary | ICD-10-CM

## 2018-05-19 DIAGNOSIS — J9811 Atelectasis: Secondary | ICD-10-CM | POA: Diagnosis not present

## 2018-05-19 NOTE — Telephone Encounter (Signed)
After f/u visit at the office with [place], [place], patient does not need any further assistance with home health, s/p CABG x 4 04/16/2018.  Malachy Mood with [facility] notified and would get VO into supervisor and nursing following this patient.

## 2018-05-19 NOTE — Progress Notes (Signed)
[address]       [place] [address]             [phone]      Leno D [facility] [facility] Medical Record #[id number] Date of Birth: 08/22/1951  Referring: Herminio Commons, MD Primary Care: Neale Burly, MD Primary Cardiologist: Kate Sable, MD   Chief Complaint:   POST OP FOLLOW UP  OPERATIVE REPORT  DATE OF PROCEDURE:  04/16/2018  PREOPERATIVE DIAGNOSIS:  Three-vessel coronary artery disease with unstable angina.  POSTOPERATIVE DIAGNOSIS:  Three-vessel coronary artery disease with unstable angina.  SURGICAL PROCEDURE:  Coronary artery bypass grafting x4 with the left internal mammary to the left anterior descending coronary artery, reverse saphenous vein graft to the diagonal coronary artery, reverse saphenous vein graft to the obtuse marginal  coronary artery, reverse saphenous vein graft to the posterior descending coronary artery with right thigh and calf greater saphenous endoscopic vein harvesting.  SURGEON:  Lanelle Bal, MD  FIRST ASSISTANT:  Lars Pinks, PA-C    History of Present Illness: The patient is a 67 year old male seen in the office on today's date and routine postsurgical follow-up.  He is status post the above described procedure.  He reports that he is continuing to make excellent progress and feels quite well.  He has not used any pain medication since discharge.  He denies fevers, chills or other constitutional symptoms.  He denies shortness of breath.  He is not having any lower extremity edema.  He denies palpitations.  He is improving daily ambulating in his neighborhood which he describes as [place] without difficulty and in particular, no shortness of breath.  He is overall quite pleased with his progress.      Past Medical History:  Diagnosis Date  . Benign essential hypertension   . Diverticulitis   . DM2 (diabetes mellitus, type 2) ([facility])   . Eczema   . Hematuria   .  Hypercholesteremia   . Hyperlipidemia   . Nephrolithiasis    Per patient "kidney cysts" not stones  . Psoriasis    right leg  . SOB (shortness of breath)      Social History   Tobacco Use  Smoking Status Former Smoker  . Packs/day: 1.50  . Years: 15.00  . Pack years: 22.50  . Types: Cigarettes  . Start date: 12/11/1963  . Last attempt to quit: 12/10/1993  . Years since quitting: 24.4  Smokeless Tobacco Never Used  Tobacco Comment   Quit smoking for 13 years at one point    Social History   Substance and Sexual Activity  Alcohol Use No  . Alcohol/week: 0.0 oz   Comment: None currently; previous "youth experience" as a teenager     Allergies  Allergen Reactions  . Other Rash    CHG wipes     Current Outpatient Medications  Medication Sig Dispense Refill  . aspirin EC 325 MG EC tablet Take 1 tablet (325 mg total) by mouth daily.    . benazepril (LOTENSIN) 5 MG tablet Take 5 mg by mouth daily.    . hydrALAZINE (APRESOLINE) 25 MG tablet Take 25 mg by mouth 2 (two) times daily.    . metoprolol tartrate (LOPRESSOR) 25 MG tablet Take 0.5 tablets (12.5 mg total) by mouth 2 (two) times daily. 30 tablet 1  . rosuvastatin (CRESTOR) 20 MG tablet Take 1 tablet (20 mg total) by mouth at bedtime. 30 tablet 1   No current facility-administered  medications for this visit.        Physical Exam: BP 127/82 (BP Location: Right Arm, Patient Position: Sitting, Cuff Size: Normal)   Pulse 80   Resp 18   Ht 5' 8.5" (1.74 m)   Wt 93.4 kg (206 lb)   SpO2 95% Comment: RA  BMI 30.87 kg/m   General appearance: alert, cooperative and no distress Heart: regular rate and rhythm Lungs: clear to auscultation bilaterally Abdomen: soft, non-tender; bowel sounds normal; no masses,  no organomegaly Extremities: No edema Wound: Incisions well-healed without evidence of infection   Diagnostic Studies & Laboratory data:     Recent Radiology Findings:   Dg Chest 2 View  Result Date:  05/19/2018 CLINICAL DATA:  CABG EXAM: CHEST - 2 VIEW COMPARISON:  04/21/2018 FINDINGS: Postop CABG. Negative for heart failure. Lungs are clear without infiltrate or effusion. Interval improvement since the prior study. IMPRESSION: Improvement in bibasilar atelectasis and effusion. Lungs are now clear Electronically Signed   By: Franchot Gallo M.D.   On: 05/19/2018 13:43      Recent Lab Findings: Lab Results  Component Value Date   WBC 8.3 04/21/2018   HGB 10.6 (L) 04/21/2018   HCT 32.8 (L) 04/21/2018   PLT 261 04/21/2018   GLUCOSE 108 (H) 04/21/2018   ALT 17 04/15/2018   AST 14 (L) 04/15/2018   NA 141 04/21/2018   K 3.7 04/21/2018   CL 102 04/21/2018   CREATININE 1.34 (H) 04/21/2018   BUN 21 (H) 04/21/2018   CO2 27 04/21/2018   INR 1.48 04/16/2018   HGBA1C 5.9 (H) 04/15/2018      Assessment / Plan: The patient is doing quite well.  He is scheduled to start cardiac rehabilitation at any pain soon.  He appears ready for this.  We discussed activity progression including driving with usual protocols.  He has been seen in cardiology follow-up and is continuing on aspirin, metoprolol, benazepril, and Crestor.  We have not made any changes to his current medication regimen.  From a surgical perspective he is quite stable and we will see him again on a as needed basis for any surgically related issues or at request.          John Giovanni, PA-C 05/19/2018 1:54 PM

## 2018-05-19 NOTE — Patient Instructions (Signed)
Discussed activity progression including lifting no more than 20 pounds for the next 2 months.  Also discussed driving restrictions and progression.

## 2018-05-22 ENCOUNTER — Telehealth: Payer: Self-pay | Admitting: *Deleted

## 2018-05-22 DIAGNOSIS — E785 Hyperlipidemia, unspecified: Secondary | ICD-10-CM

## 2018-05-22 NOTE — Telephone Encounter (Signed)
Recent labs done by pmd reviewed by Dr. Bronson Ing - provider would like to have these repeated as they were done 12/23/2017.    Called patient - spoke with wife Albert Peterson) - stated that he just was started on Crestor 20mg  daily on 04/16/2018.  Was not on any cholesterol medications prior to this.    When should he have Lipids done again.  He does already have 3 month f/u scheduled for 08/18/2018.

## 2018-05-23 NOTE — Telephone Encounter (Signed)
Repeat in August.

## 2018-05-28 ENCOUNTER — Encounter (HOSPITAL_COMMUNITY): Payer: Medicare Other

## 2018-05-28 NOTE — Telephone Encounter (Signed)
Left message to return call 

## 2018-05-29 NOTE — Telephone Encounter (Signed)
Wife [name]) notified yesterday.  Will mail lab order to home - will do at his pmd office ([facility]).

## 2018-06-02 ENCOUNTER — Encounter (HOSPITAL_COMMUNITY)
Admission: RE | Admit: 2018-06-02 | Discharge: 2018-06-02 | Disposition: A | Discharge status: Home / Self Care | Payer: Medicare Other | Source: Ambulatory Visit | Attending: Cardiovascular Disease | Admitting: Cardiovascular Disease

## 2018-06-02 ENCOUNTER — Encounter (HOSPITAL_COMMUNITY): Payer: Self-pay

## 2018-06-02 VITALS — BP 140/70 | HR 77 | Ht 68.5 in | Wt 212.8 lb

## 2018-06-02 DIAGNOSIS — Z951 Presence of aortocoronary bypass graft: Secondary | ICD-10-CM | POA: Diagnosis not present

## 2018-06-02 DIAGNOSIS — Z48812 Encounter for surgical aftercare following surgery on the circulatory system: Secondary | ICD-10-CM | POA: Diagnosis not present

## 2018-06-02 NOTE — Progress Notes (Signed)
Daily Session Note  Patient Details  Name: Albert Peterson MRN: [id number] Date of Birth: [birth year] Referring Provider:     CARDIAC REHAB PHASE II ORIENTATION from 06/02/2018 in [facility]  Referring Provider  Dr. Bronson Ing      Encounter Date: 06/02/2018  Check In: Session Check In - 06/02/18 1230      Check-In   Location  [facility]    Staff Present  Arelie Kuzel Angelina Pih, MS, EP, [facility], Exercise Physiologist;Gregory Luther Parody, BS, EP, Exercise Physiologist;Debra Wynetta Emery, RN, BSN    Supervising physician immediately available to respond to emergencies  See telemetry face sheet for immediately available MD    Medication changes reported      No    Fall or balance concerns reported     No    Tobacco Cessation  -- Quit 1995    Warm-up and Cool-down  Performed as group-led instruction    Resistance Training Performed  Yes    VAD Patient?  No      Pain Assessment   Currently in Pain?  No/denies    Pain Score  0-No pain    Multiple Pain Sites  No       Capillary Blood Glucose: No results found for this or any previous visit (from the past 24 hour(s)).    Social History   Tobacco Use  Smoking Status Former Smoker  . Packs/day: 1.50  . Years: 15.00  . Pack years: 22.50  . Types: Cigarettes  . Start date: 12/11/1963  . Last attempt to quit: 12/10/1993  . Years since quitting: 24.4  Smokeless Tobacco Never Used  Tobacco Comment   Quit smoking for 13 years at one point    Goals Met:  Independence with exercise equipment Exercise tolerated well Personal goals reviewed No report of cardiac concerns or symptoms Strength training completed today  Goals Unmet:  Not Applicable  Comments: Check out: 1500   Dr. Kate Sable is Medical Director for [facility] and Pulmonary Rehab.

## 2018-06-02 NOTE — Progress Notes (Signed)
Cardiac/Pulmonary Rehab Medication Review by a Pharmacist  Does the patient  feel that his/her medications are working for him/her: yes  Has the patient been experiencing any side effects to the medications prescribed?  no  Does the patient measure his/her own blood pressure or blood glucose at home?  yes  Does the patient have any problems obtaining medications due to transportation or finances?   no  Understanding of regimen: excellent Understanding of indications: excellent Potential of compliance: excellent  Questions asked to Determine Patient Understanding of Medication Regimen:  1. What is the name of the medication?  2. What is the medication used for?  3. When should it be taken?  4. How much should be taken?  5. How will you take it?  6. What side effects should you report?  Understanding Defined as: Excellent: All questions above are correct Good: Questions 1-4 are correct Fair: Questions 1-2 are correct  Poor: 1 or none of the above questions are correct   Pharmacist comments: Patient was very cognizant of medication list and has experienced no untoward side effects due to medications.  He stated that he prefers to take only Tylenol for pain, since narcotics cause N/V for him.    Despina Pole 06/02/2018 2:09 PM

## 2018-06-02 NOTE — Progress Notes (Signed)
Cardiac Individual Treatment Plan  Patient Details  Name: Albert Peterson MRN: [id number] Date of Birth: 14-Sep-1951 Referring Provider:     CARDIAC REHAB PHASE II ORIENTATION from 06/02/2018 in [facility]  Referring Provider  Dr. Bronson Ing      Initial Encounter Date:    CARDIAC REHAB PHASE II ORIENTATION from 06/02/2018 in [place]  Date  06/02/18  Referring Provider  Dr. Bronson Ing      Visit Diagnosis: S/P CABG x 4  Patient's Home Medications on Admission:  Current Outpatient Medications:  .  aspirin EC 325 MG EC tablet, Take 1 tablet (325 mg total) by mouth daily., Disp: , Rfl:  .  benazepril (LOTENSIN) 5 MG tablet, Take 5 mg by mouth daily., Disp: , Rfl:  .  hydrALAZINE (APRESOLINE) 25 MG tablet, Take 25 mg by mouth 2 (two) times daily., Disp: , Rfl:  .  metoprolol tartrate (LOPRESSOR) 25 MG tablet, Take 0.5 tablets (12.5 mg total) by mouth 2 (two) times daily., Disp: 30 tablet, Rfl: 1 .  rosuvastatin (CRESTOR) 20 MG tablet, Take 1 tablet (20 mg total) by mouth at bedtime., Disp: 30 tablet, Rfl: 1  Past Medical History: Past Medical History:  Diagnosis Date  . Benign essential hypertension   . Diverticulitis   . DM2 (diabetes mellitus, type 2) ([place])   . Eczema   . Hematuria   . Hypercholesteremia   . Hyperlipidemia   . Nephrolithiasis    Per patient "kidney cysts" not stones  . Psoriasis    right leg  . SOB (shortness of breath)     Tobacco Use: Social History   Tobacco Use  Smoking Status Former Smoker  . Packs/day: 1.50  . Years: 15.00  . Pack years: 22.50  . Types: Cigarettes  . Start date: 12/11/1963  . Last attempt to quit: 12/10/1993  . Years since quitting: 24.4  Smokeless Tobacco Never Used  Tobacco Comment   Quit smoking for 13 years at one point    Labs: Recent Review Flowsheet Data    Labs for ITP Cardiac and Pulmonary Rehab Latest Ref Rng & Units 04/16/2018 04/16/2018 04/16/2018 04/16/2018 04/17/2018    Hemoglobin A1c 4.8 - 5.6 % - - - - -   PHART 7.350 - 7.450 7.299(L) 7.378 - 7.381 -   PCO2ART 32.0 - 48.0 mmHg 44.7 45.8 - 44.3 -   HCO3 20.0 - 28.0 mmol/L 21.9 26.8 - 26.1 -   TCO2 22 - 32 mmol/L 23 28 25 27 26    ACIDBASEDEF 0.0 - 2.0 mmol/L 4.0(H) - - - -   O2SAT % 96.0 96.0 - 92.0 -      Capillary Blood Glucose: Lab Results  Component Value Date   GLUCAP 114 (H) 04/22/2018   GLUCAP 110 (H) 04/22/2018   GLUCAP 116 (H) 04/21/2018   GLUCAP 116 (H) 04/21/2018   GLUCAP 115 (H) 04/21/2018     Exercise Target Goals: Date: 06/02/18  Exercise Program Goal: Individual exercise prescription set using results from initial 6 min walk test and THRR while considering  patient's activity barriers and safety.   Exercise Prescription Goal: Starting with aerobic activity 30 plus minutes a day, 3 days per week for initial exercise prescription. Provide home exercise prescription and guidelines that participant acknowledges understanding prior to discharge.  Activity Barriers & Risk Stratification: Activity Barriers & Cardiac Risk Stratification - 06/02/18 1442      Activity Barriers & Cardiac Risk Stratification   Activity Barriers  None  Cardiac Risk Stratification  High       6 Minute Walk: 6 Minute Walk    Row Name 06/02/18 1441         6 Minute Walk   Phase  Initial     Distance  1300 feet     Distance % Change  0 %     Distance Feet Change  0 ft     Walk Time  6 minutes     # of Rest Breaks  0     MPH  2.46     METS  2.88     RPE  11     Perceived Dyspnea   11     VO2 Peak  10.49     Symptoms  No     Resting HR  77 bpm     Resting BP  150/70     Resting Oxygen Saturation   96 %     Exercise Oxygen Saturation  during 6 min walk  96 %     Max Ex. HR  101 bpm     Max Ex. BP  160/74     2 Minute Post BP  136/70        Oxygen Initial Assessment:   Oxygen Re-Evaluation:   Oxygen Discharge (Final Oxygen Re-Evaluation):   Initial Exercise  Prescription: Initial Exercise Prescription - 06/02/18 1400      Date of Initial Exercise RX and Referring Provider   Date  06/02/18    Referring Provider  Dr. Bronson Ing      Treadmill   MPH  1.6    Grade  0    Minutes  15    METs  2.2      Recumbant Elliptical   Level  1    RPM  38    Watts  37    Minutes  20    METs  2      Prescription Details   Frequency (times per week)  3    Duration  Progress to 30 minutes of continuous aerobic without signs/symptoms of physical distress      Intensity   THRR 40-80% of Max Heartrate  107-122-137    Ratings of Perceived Exertion  11-13    Perceived Dyspnea  0-4      Progression   Progression  Continue progressive overload as per policy without signs/symptoms or physical distress.      Resistance Training   Training Prescription  Yes    Weight  1    Reps  10-15       Perform Capillary Blood Glucose checks as needed.  Exercise Prescription Changes:   Exercise Comments:   Exercise Goals and Review:  Exercise Goals    Row Name 06/02/18 1443             Exercise Goals   Increase Physical Activity  Yes       Intervention  Provide advice, education, support and counseling about physical activity/exercise needs.;Develop an individualized exercise prescription for aerobic and resistive training based on initial evaluation findings, risk stratification, comorbidities and participant's personal goals.       Expected Outcomes  Short Term: Attend rehab on a regular basis to increase amount of physical activity.       Increase Strength and Stamina  Yes       Intervention  Provide advice, education, support and counseling about physical activity/exercise needs.;Develop an individualized exercise prescription for aerobic and resistive training based on initial evaluation  findings, risk stratification, comorbidities and participant's personal goals.       Expected Outcomes  Short Term: Increase workloads from initial exercise  prescription for resistance, speed, and METs.       Able to understand and use rate of perceived exertion (RPE) scale  Yes       Intervention  Provide education and explanation on how to use RPE scale       Expected Outcomes  Short Term: Able to use RPE daily in rehab to express subjective intensity level;Long Term:  Able to use RPE to guide intensity level when exercising independently       Able to understand and use Dyspnea scale  Yes       Intervention  Provide education and explanation on how to use Dyspnea scale       Expected Outcomes  Short Term: Able to use Dyspnea scale daily in rehab to express subjective sense of shortness of breath during exertion;Long Term: Able to use Dyspnea scale to guide intensity level when exercising independently       Knowledge and understanding of Target Heart Rate Range (THRR)  Yes       Intervention  Provide education and explanation of THRR including how the numbers were predicted and where they are located for reference       Expected Outcomes  Short Term: Able to state/look up THRR;Long Term: Able to use THRR to govern intensity when exercising independently;Short Term: Able to use daily as guideline for intensity in rehab       Able to check pulse independently  Yes       Intervention  Provide education and demonstration on how to check pulse in carotid and radial arteries.;Review the importance of being able to check your own pulse for safety during independent exercise       Expected Outcomes  Short Term: Able to explain why pulse checking is important during independent exercise;Long Term: Able to check pulse independently and accurately       Understanding of Exercise Prescription  Yes       Intervention  Provide education, explanation, and written materials on patient's individual exercise prescription       Expected Outcomes  Short Term: Able to explain program exercise prescription;Long Term: Able to explain home exercise prescription to exercise  independently          Exercise Goals Re-Evaluation :    Discharge Exercise Prescription (Final Exercise Prescription Changes):   Nutrition:  Target Goals: Understanding of nutrition guidelines, daily intake of sodium 1500mg , cholesterol 200mg , calories 30% from fat and 7% or less from saturated fats, daily to have 5 or more servings of fruits and vegetables.  Biometrics: Pre Biometrics - 06/02/18 1444      Pre Biometrics   Height  5' 8.5" (1.74 m)    Weight  212 lb 12.8 oz (96.5 kg)    Waist Circumference  43.5 inches    Hip Circumference  39.5 inches    Waist to Hip Ratio  1.1 %    BMI (Calculated)  31.88    Triceps Skinfold  16 mm    % Body Fat  31 %    Grip Strength  55.33 kg    Flexibility  0 in    Single Leg Stand  60 seconds        Nutrition Therapy Plan and Nutrition Goals: Nutrition Therapy & Goals - 06/02/18 1505      Personal Nutrition Goals   Personal  Goal #2  Patient is eating a heart health diet    Additional Goals?  No       Nutrition Assessments: Nutrition Assessments - 06/02/18 1505      MEDFICTS Scores   Pre Score  15       Nutrition Goals Re-Evaluation:   Nutrition Goals Discharge (Final Nutrition Goals Re-Evaluation):   Psychosocial: Target Goals: Acknowledge presence or absence of significant depression and/or stress, maximize coping skills, provide positive support system. Participant is able to verbalize types and ability to use techniques and skills needed for reducing stress and depression.  Initial Review & Psychosocial Screening: Initial Psych Review & Screening - 06/02/18 1507      Initial Review   Current issues with  None Identified      Family Dynamics   Good Support System?  Yes      Barriers   Psychosocial barriers to participate in program  There are no identifiable barriers or psychosocial needs.      Screening Interventions   Interventions  Encouraged to exercise    Expected Outcomes  Short Term goal:  Identification and review with participant of any Quality of Life or Depression concerns found by scoring the questionnaire.;Long Term goal: The participant improves quality of Life and PHQ9 Scores as seen by post scores and/or verbalization of changes       Quality of Life Scores: Quality of Life - 06/02/18 1407      Quality of Life Scores   Health/Function Pre  29.5 %    Socioeconomic Pre  24.64 %    Psych/Spiritual Pre  30 %    Family Pre  30 %    GLOBAL Pre  28.6 %      Scores of 19 and below usually indicate a poorer quality of life in these areas.  A difference of  2-3 points is a clinically meaningful difference.  A difference of 2-3 points in the total score of the Quality of Life Index has been associated with significant improvement in overall quality of life, self-image, physical symptoms, and general health in studies assessing change in quality of life.  PHQ-9: Recent Review Flowsheet Data    Depression screen [facility] 2/9 06/02/2018   Decreased Interest 0   Down, Depressed, Hopeless 0   PHQ - 2 Score 0   Altered sleeping 0   Tired, decreased energy 0   Change in appetite 0   Feeling bad or failure about yourself  0   Trouble concentrating 0   Moving slowly or fidgety/restless 0   Suicidal thoughts 0   PHQ-9 Score 0     Interpretation of Total Score  Total Score Depression Severity:  1-4 = Minimal depression, 5-9 = Mild depression, 10-14 = Moderate depression, 15-19 = Moderately severe depression, 20-27 = Severe depression   Psychosocial Evaluation and Intervention: Psychosocial Evaluation - 06/02/18 1507      Psychosocial Evaluation & Interventions   Interventions  Encouraged to exercise with the program and follow exercise prescription    Continue Psychosocial Services   No Follow up required       Psychosocial Re-Evaluation:   Psychosocial Discharge (Final Psychosocial Re-Evaluation):   Vocational Rehabilitation: Provide vocational rehab assistance to  qualifying candidates.   Vocational Rehab Evaluation & Intervention: Vocational Rehab - 06/02/18 1504      Initial Vocational Rehab Evaluation & Intervention   Assessment shows need for Vocational Rehabilitation  No       Education: Education Goals: Education classes will  be provided on a weekly basis, covering required topics. Participant will state understanding/return demonstration of topics presented.  Learning Barriers/Preferences: Learning Barriers/Preferences - 06/02/18 1502      Learning Barriers/Preferences   Learning Barriers  None    Learning Preferences  Skilled Demonstration;Individual Instruction;Group Instruction       Education Topics: Hypertension, Hypertension Reduction -Define heart disease and high blood pressure. Discus how high blood pressure affects the body and ways to reduce high blood pressure.   Exercise and Your Heart -Discuss why it is important to exercise, the FITT principles of exercise, normal and abnormal responses to exercise, and how to exercise safely.   Angina -Discuss definition of angina, causes of angina, treatment of angina, and how to decrease risk of having angina.   Cardiac Medications -Review what the following cardiac medications are used for, how they affect the body, and side effects that may occur when taking the medications.  Medications include Aspirin, Beta blockers, calcium channel blockers, ACE Inhibitors, angiotensin receptor blockers, diuretics, digoxin, and antihyperlipidemics.   Congestive Heart Failure -Discuss the definition of CHF, how to live with CHF, the signs and symptoms of CHF, and how keep track of weight and sodium intake.   Heart Disease and Intimacy -Discus the effect sexual activity has on the heart, how changes occur during intimacy as we age, and safety during sexual activity.   Smoking Cessation / COPD -Discuss different methods to quit smoking, the health benefits of quitting smoking, and the  definition of COPD.   Nutrition I: Fats -Discuss the types of cholesterol, what cholesterol does to the heart, and how cholesterol levels can be controlled.   Nutrition II: Labels -Discuss the different components of food labels and how to read food label   Heart Parts/Heart Disease and PAD -Discuss the anatomy of the heart, the pathway of blood circulation through the heart, and these are affected by heart disease.   Stress I: Signs and Symptoms -Discuss the causes of stress, how stress may lead to anxiety and depression, and ways to limit stress.   Stress II: Relaxation -Discuss different types of relaxation techniques to limit stress.   Warning Signs of Stroke / TIA -Discuss definition of a stroke, what the signs and symptoms are of a stroke, and how to identify when someone is having stroke.   Knowledge Questionnaire Score: Knowledge Questionnaire Score - 06/02/18 1502      Knowledge Questionnaire Score   Pre Score  22/24       Core Components/Risk Factors/Patient Goals at Admission: Personal Goals and Risk Factors at Admission - 06/02/18 1506      Core Components/Risk Factors/Patient Goals on Admission    Weight Management  Weight Maintenance    Personal Goal Other  Yes    Personal Goal  Get strong enough to get back to work    Intervention  Attend CR 3 x week and supplement with home exercise 2 x week    Expected Outcomes  Achieve personal goals.        Core Components/Risk Factors/Patient Goals Review:    Core Components/Risk Factors/Patient Goals at Discharge (Final Review):    ITP Comments:   Comments: Patient arrived for 1st visit/orientation/education at 1230. Patient was referred to CR by Dr. Bronson Ing due to CABGx4 (Z95.1). During orientation advised patient on arrival and appointment times what to wear, what to do before, during and after exercise. Reviewed attendance and class policy. Talked about inclement weather and class consultation policy.  Pt is scheduled  to return Cardiac Rehab on 06/04/18 at 0930. Pt was advised to come to class 15 minutes before class starts. Patient was also given instructions on meeting with the dietician and attending the Family Structure classes. Discussed RPE/Dpysnea scales. Discussed initial THR and how to find their radial and/or carotid pulse. Discussed the initial exercise prescription and how this effects their progress. Pt is eager to get started. Patient participated in warm up stretches followed by light weights and resistance bands. Patient was able to complete 6 minute walk test. Patient did not c/o pain. Patient was measured for the equipment. Discussed equipment safety with patient. Took patient pre-anthropometric measurements. Patient finished visit at 1500.

## 2018-06-04 ENCOUNTER — Encounter (HOSPITAL_COMMUNITY)
Admission: RE | Admit: 2018-06-04 | Discharge: 2018-06-04 | Disposition: A | Discharge status: Home / Self Care | Payer: Medicare Other | Source: Ambulatory Visit | Attending: Cardiovascular Disease | Admitting: Cardiovascular Disease

## 2018-06-04 DIAGNOSIS — Z951 Presence of aortocoronary bypass graft: Secondary | ICD-10-CM | POA: Diagnosis not present

## 2018-06-04 DIAGNOSIS — Z48812 Encounter for surgical aftercare following surgery on the circulatory system: Secondary | ICD-10-CM | POA: Diagnosis not present

## 2018-06-04 NOTE — Progress Notes (Signed)
Daily Session Note  Patient Details  Name: Albert Peterson MRN: [id number] Date of Birth: 1951/06/08 Referring Provider:     CARDIAC REHAB PHASE II ORIENTATION from 06/02/2018 in [place]  Referring Provider  Dr. Bronson Ing      Encounter Date: 06/04/2018  Check In: Session Check In - 06/04/18 0930      Check-In   Location  [facility]    Staff Present  Diane Angelina Pih, MS, EP, [facility], Exercise Physiologist;Van Seymore Luther Parody, BS, EP, Exercise Physiologist;Debra Wynetta Emery, RN, BSN    Supervising physician immediately available to respond to emergencies  See telemetry face sheet for immediately available MD    Medication changes reported      No    Fall or balance concerns reported     No    Warm-up and Cool-down  Performed as group-led instruction    Resistance Training Performed  Yes    VAD Patient?  No    PAD/SET Patient?  No      Pain Assessment   Currently in Pain?  No/denies    Pain Score  0-No pain    Multiple Pain Sites  No       Capillary Blood Glucose: No results found for this or any previous visit (from the past 24 hour(s)).    Social History   Tobacco Use  Smoking Status Former Smoker  . Packs/day: 1.50  . Years: 15.00  . Pack years: 22.50  . Types: Cigarettes  . Start date: 12/11/1963  . Last attempt to quit: 12/10/1993  . Years since quitting: 24.4  Smokeless Tobacco Never Used  Tobacco Comment   Quit smoking for 13 years at one point    Goals Met:  Independence with exercise equipment Exercise tolerated well No report of cardiac concerns or symptoms Strength training completed today  Goals Unmet:  Not Applicable  Comments: Check out 1030   Dr. Kate Sable is Medical Director for [place] and Pulmonary Rehab.

## 2018-06-06 ENCOUNTER — Encounter (HOSPITAL_COMMUNITY)
Admission: RE | Admit: 2018-06-06 | Discharge: 2018-06-06 | Disposition: A | Discharge status: Home / Self Care | Payer: Medicare Other | Source: Ambulatory Visit | Attending: Cardiovascular Disease | Admitting: Cardiovascular Disease

## 2018-06-06 DIAGNOSIS — Z951 Presence of aortocoronary bypass graft: Secondary | ICD-10-CM | POA: Diagnosis not present

## 2018-06-06 DIAGNOSIS — Z48812 Encounter for surgical aftercare following surgery on the circulatory system: Secondary | ICD-10-CM | POA: Diagnosis not present

## 2018-06-06 NOTE — Progress Notes (Signed)
Daily Session Note  Patient Details  Name: ENDRIT GITTINS MRN: [id number] Date of Birth: 1951-03-05 Referring Provider:     CARDIAC REHAB PHASE II ORIENTATION from 06/02/2018 in [place]  Referring Provider  Dr. Bronson Ing      Encounter Date: 06/06/2018  Check In: Session Check In - 06/06/18 0930      Check-In   Location  [facility]    Staff Present  Suzanne Boron, BS, EP, Exercise Physiologist;Debra Wynetta Emery, RN, BSN    Supervising physician immediately available to respond to emergencies  See telemetry face sheet for immediately available MD    Medication changes reported      No    Fall or balance concerns reported     No    Warm-up and Cool-down  Performed as group-led instruction    Resistance Training Performed  Yes    VAD Patient?  No    PAD/SET Patient?  No      Pain Assessment   Currently in Pain?  No/denies    Pain Score  0-No pain    Multiple Pain Sites  No       Capillary Blood Glucose: No results found for this or any previous visit (from the past 24 hour(s)).    Social History   Tobacco Use  Smoking Status Former Smoker  . Packs/day: 1.50  . Years: 15.00  . Pack years: 22.50  . Types: Cigarettes  . Start date: 12/11/1963  . Last attempt to quit: 12/10/1993  . Years since quitting: 24.5  Smokeless Tobacco Never Used  Tobacco Comment   Quit smoking for 13 years at one point    Goals Met:  Independence with exercise equipment Exercise tolerated well No report of cardiac concerns or symptoms Strength training completed today  Goals Unmet:  Not Applicable  Comments: Check out 1030   Dr. Kate Sable is Medical Director for [place] and Pulmonary Rehab.

## 2018-06-09 ENCOUNTER — Encounter (HOSPITAL_COMMUNITY)
Admission: RE | Admit: 2018-06-09 | Discharge: 2018-06-09 | Disposition: A | Discharge status: Home / Self Care | Payer: Medicare Other | Source: Ambulatory Visit | Attending: Cardiovascular Disease | Admitting: Cardiovascular Disease

## 2018-06-09 DIAGNOSIS — Z951 Presence of aortocoronary bypass graft: Secondary | ICD-10-CM | POA: Diagnosis not present

## 2018-06-09 DIAGNOSIS — Z48812 Encounter for surgical aftercare following surgery on the circulatory system: Secondary | ICD-10-CM | POA: Insufficient documentation

## 2018-06-09 DIAGNOSIS — Z87891 Personal history of nicotine dependence: Secondary | ICD-10-CM | POA: Insufficient documentation

## 2018-06-09 DIAGNOSIS — Z4889 Encounter for other specified surgical aftercare: Secondary | ICD-10-CM | POA: Insufficient documentation

## 2018-06-09 NOTE — Progress Notes (Signed)
Daily Session Note  Patient Details  Name: Albert Peterson MRN: [id number] Date of Birth: August 15, 1951 Referring Provider:     CARDIAC REHAB PHASE II ORIENTATION from 06/02/2018 in [place]  Referring Provider  Dr. Bronson Ing      Encounter Date: 06/09/2018  Check In: Session Check In - 06/09/18 0930      Check-In   Location  [facility]    Staff Present  Suzanne Boron, BS, EP, Exercise Physiologist;Debra Wynetta Emery, RN, BSN;Diane Coad, MS, EP, [facility], Exercise Physiologist    Supervising physician immediately available to respond to emergencies  See telemetry face sheet for immediately available MD    Medication changes reported      No    Fall or balance concerns reported     No    Warm-up and Cool-down  Performed as group-led instruction    Resistance Training Performed  Yes    VAD Patient?  No    PAD/SET Patient?  No      Pain Assessment   Currently in Pain?  No/denies    Pain Score  0-No pain    Multiple Pain Sites  No       Capillary Blood Glucose: No results found for this or any previous visit (from the past 24 hour(s)).    Social History   Tobacco Use  Smoking Status Former Smoker  . Packs/day: 1.50  . Years: 15.00  . Pack years: 22.50  . Types: Cigarettes  . Start date: 12/11/1963  . Last attempt to quit: 12/10/1993  . Years since quitting: 24.5  Smokeless Tobacco Never Used  Tobacco Comment   Quit smoking for 13 years at one point    Goals Met:  Independence with exercise equipment Exercise tolerated well No report of cardiac concerns or symptoms Strength training completed today  Goals Unmet:  Not Applicable  Comments: Check out 1030   Dr. Kate Sable is Medical Director for [place] and Pulmonary Rehab.

## 2018-06-11 ENCOUNTER — Encounter (HOSPITAL_COMMUNITY)
Admission: RE | Admit: 2018-06-11 | Discharge: 2018-06-11 | Disposition: A | Discharge status: Home / Self Care | Payer: Medicare Other | Source: Ambulatory Visit | Attending: Cardiovascular Disease | Admitting: Cardiovascular Disease

## 2018-06-11 DIAGNOSIS — Z951 Presence of aortocoronary bypass graft: Secondary | ICD-10-CM | POA: Diagnosis not present

## 2018-06-11 DIAGNOSIS — Z48812 Encounter for surgical aftercare following surgery on the circulatory system: Secondary | ICD-10-CM | POA: Diagnosis not present

## 2018-06-11 NOTE — Progress Notes (Signed)
Daily Session Note  Patient Details  Name: Albert Peterson MRN: [id number] Date of Birth: [birth year] Referring Provider:     CARDIAC REHAB PHASE II ORIENTATION from 06/02/2018 in [place]  Referring Provider  Dr. Bronson Ing      Encounter Date: 06/11/2018  Check In: Session Check In - 06/11/18 0930      Check-In   Location  [facility]    Staff Present  Suzanne Boron, BS, EP, Exercise Physiologist;Debra Wynetta Emery, RN, BSN;Diane Coad, MS, EP, [facility], Exercise Physiologist    Supervising physician immediately available to respond to emergencies  See telemetry face sheet for immediately available MD    Medication changes reported      No    Fall or balance concerns reported     No    Warm-up and Cool-down  Performed as group-led instruction    Resistance Training Performed  Yes    VAD Patient?  No    PAD/SET Patient?  No      Pain Assessment   Currently in Pain?  No/denies    Pain Score  0-No pain    Multiple Pain Sites  No       Capillary Blood Glucose: No results found for this or any previous visit (from the past 24 hour(s)).    Social History   Tobacco Use  Smoking Status Former Smoker  . Packs/day: 1.50  . Years: 15.00  . Pack years: 22.50  . Types: Cigarettes  . Start date: 12/11/1963  . Last attempt to quit: 12/10/1993  . Years since quitting: 24.5  Smokeless Tobacco Never Used  Tobacco Comment   Quit smoking for 13 years at one point    Goals Met:  Independence with exercise equipment Exercise tolerated well No report of cardiac concerns or symptoms Strength training completed today  Goals Unmet:  Not Applicable  Comments: Check out 1030   Dr. Kate Sable is Medical Director for [place] and Pulmonary Rehab.

## 2018-06-13 ENCOUNTER — Encounter (HOSPITAL_COMMUNITY)
Admission: RE | Admit: 2018-06-13 | Discharge: 2018-06-13 | Disposition: A | Discharge status: Home / Self Care | Payer: Medicare Other | Source: Ambulatory Visit | Attending: Cardiovascular Disease | Admitting: Cardiovascular Disease

## 2018-06-13 DIAGNOSIS — Z951 Presence of aortocoronary bypass graft: Secondary | ICD-10-CM

## 2018-06-13 DIAGNOSIS — Z48812 Encounter for surgical aftercare following surgery on the circulatory system: Secondary | ICD-10-CM | POA: Diagnosis not present

## 2018-06-13 NOTE — Progress Notes (Signed)
Daily Session Note  Patient Details  Name: Albert Peterson MRN: [id number] Date of Birth: 1951/10/28 Referring Provider:     CARDIAC REHAB PHASE II ORIENTATION from 06/02/2018 in [place]  Referring Provider  Dr. Bronson Ing      Encounter Date: 06/13/2018  Check In: Session Check In - 06/13/18 0930      Check-In   Location  [facility]    Staff Present  Suzanne Boron, BS, EP, Exercise Physiologist;Debra Wynetta Emery, RN, BSN;Diane Coad, MS, EP, [facility], Exercise Physiologist    Supervising physician immediately available to respond to emergencies  See telemetry face sheet for immediately available MD    Medication changes reported      No    Fall or balance concerns reported     No    Warm-up and Cool-down  Performed as group-led instruction    Resistance Training Performed  Yes    VAD Patient?  No    PAD/SET Patient?  No      Pain Assessment   Currently in Pain?  No/denies    Pain Score  0-No pain    Multiple Pain Sites  No       Capillary Blood Glucose: No results found for this or any previous visit (from the past 24 hour(s)).    Social History   Tobacco Use  Smoking Status Former Smoker  . Packs/day: 1.50  . Years: 15.00  . Pack years: 22.50  . Types: Cigarettes  . Start date: 12/11/1963  . Last attempt to quit: 12/10/1993  . Years since quitting: 24.5  Smokeless Tobacco Never Used  Tobacco Comment   Quit smoking for 13 years at one point    Goals Met:  Independence with exercise equipment Exercise tolerated well No report of cardiac concerns or symptoms Strength training completed today  Goals Unmet:  Not Applicable  Comments: Check out 1030   Dr. Kate Sable is Medical Director for [place] and Pulmonary Rehab.

## 2018-06-16 ENCOUNTER — Encounter (HOSPITAL_COMMUNITY)
Admission: RE | Admit: 2018-06-16 | Discharge: 2018-06-16 | Disposition: A | Discharge status: Home / Self Care | Payer: Medicare Other | Source: Ambulatory Visit | Attending: Cardiovascular Disease | Admitting: Cardiovascular Disease

## 2018-06-16 DIAGNOSIS — Z48812 Encounter for surgical aftercare following surgery on the circulatory system: Secondary | ICD-10-CM | POA: Diagnosis not present

## 2018-06-16 DIAGNOSIS — Z951 Presence of aortocoronary bypass graft: Secondary | ICD-10-CM

## 2018-06-16 NOTE — Progress Notes (Signed)
Daily Session Note  Patient Details  Name: Albert Peterson MRN: [id number] Date of Birth: Oct 07, 1951 Referring Provider:     CARDIAC REHAB PHASE II ORIENTATION from 06/02/2018 in [place]  Referring Provider  Dr. Bronson Ing      Encounter Date: 06/16/2018  Check In: Session Check In - 06/16/18 0930      Check-In   Location  [facility]    Staff Present  Suzanne Boron, BS, EP, Exercise Physiologist;Sopheap Basic Wynetta Emery, RN, BSN;Diane Coad, MS, EP, [facility], Exercise Physiologist    Supervising physician immediately available to respond to emergencies  See telemetry face sheet for immediately available MD    Medication changes reported      No    Fall or balance concerns reported     No    Warm-up and Cool-down  Performed as group-led instruction    Resistance Training Performed  Yes    VAD Patient?  No    PAD/SET Patient?  No      Pain Assessment   Currently in Pain?  No/denies    Pain Score  0-No pain    Multiple Pain Sites  No       Capillary Blood Glucose: No results found for this or any previous visit (from the past 24 hour(s)).    Social History   Tobacco Use  Smoking Status Former Smoker  . Packs/day: 1.50  . Years: 15.00  . Pack years: 22.50  . Types: Cigarettes  . Start date: 12/11/1963  . Last attempt to quit: 12/10/1993  . Years since quitting: 24.5  Smokeless Tobacco Never Used  Tobacco Comment   Quit smoking for 13 years at one point    Goals Met:  Independence with exercise equipment Exercise tolerated well No report of cardiac concerns or symptoms Strength training completed today  Goals Unmet:  Not Applicable  Comments: Check out 1030.   Dr. Kate Sable is Medical Director for [facility] and Pulmonary Rehab.

## 2018-06-18 ENCOUNTER — Encounter (HOSPITAL_COMMUNITY)
Admission: RE | Admit: 2018-06-18 | Discharge: 2018-06-18 | Disposition: A | Discharge status: Home / Self Care | Payer: Medicare Other | Source: Ambulatory Visit | Attending: Cardiovascular Disease | Admitting: Cardiovascular Disease

## 2018-06-18 DIAGNOSIS — Z951 Presence of aortocoronary bypass graft: Secondary | ICD-10-CM | POA: Diagnosis not present

## 2018-06-18 DIAGNOSIS — Z48812 Encounter for surgical aftercare following surgery on the circulatory system: Secondary | ICD-10-CM | POA: Diagnosis not present

## 2018-06-18 NOTE — Progress Notes (Signed)
Cardiac Individual Treatment Plan  Patient Details  Name: Albert Peterson MRN: [id number] Date of Birth: [birth year] Referring Provider:     CARDIAC REHAB PHASE II ORIENTATION from 06/02/2018 in [place]  Referring Provider  Dr. Bronson Ing      Initial Encounter Date:    CARDIAC REHAB PHASE II ORIENTATION from 06/02/2018 in [place]  Date  06/02/18      Visit Diagnosis: S/P CABG x 4  Patient's Home Medications on Admission:  Current Outpatient Medications:  .  aspirin EC 325 MG EC tablet, Take 1 tablet (325 mg total) by mouth daily., Disp: , Rfl:  .  benazepril (LOTENSIN) 5 MG tablet, Take 5 mg by mouth daily., Disp: , Rfl:  .  hydrALAZINE (APRESOLINE) 25 MG tablet, Take 25 mg by mouth 2 (two) times daily., Disp: , Rfl:  .  metoprolol tartrate (LOPRESSOR) 25 MG tablet, Take 0.5 tablets (12.5 mg total) by mouth 2 (two) times daily., Disp: 30 tablet, Rfl: 1 .  rosuvastatin (CRESTOR) 20 MG tablet, Take 1 tablet (20 mg total) by mouth at bedtime., Disp: 30 tablet, Rfl: 1  Past Medical History: Past Medical History:  Diagnosis Date  . Benign essential hypertension   . Diverticulitis   . DM2 (diabetes mellitus, type 2) ([place])   . Eczema   . Hematuria   . Hypercholesteremia   . Hyperlipidemia   . Nephrolithiasis    Per patient "kidney cysts" not stones  . Psoriasis    right leg  . SOB (shortness of breath)     Tobacco Use: Social History   Tobacco Use  Smoking Status Former Smoker  . Packs/day: 1.50  . Years: 15.00  . Pack years: 22.50  . Types: Cigarettes  . Start date: 12/11/1963  . Last attempt to quit: 12/10/1993  . Years since quitting: 24.5  Smokeless Tobacco Never Used  Tobacco Comment   Quit smoking for 13 years at one point    Labs: Recent Review Flowsheet Data    Labs for ITP Cardiac and Pulmonary Rehab Latest Ref Rng & Units 04/16/2018 04/16/2018 04/16/2018 04/16/2018 04/17/2018   Hemoglobin A1c 4.8 - 5.6 % - - - - -    PHART 7.350 - 7.450 7.299(L) 7.378 - 7.381 -   PCO2ART 32.0 - 48.0 mmHg 44.7 45.8 - 44.3 -   HCO3 20.0 - 28.0 mmol/L 21.9 26.8 - 26.1 -   TCO2 22 - 32 mmol/L 23 28 25 27 26    ACIDBASEDEF 0.0 - 2.0 mmol/L 4.0(H) - - - -   O2SAT % 96.0 96.0 - 92.0 -      Capillary Blood Glucose: Lab Results  Component Value Date   GLUCAP 114 (H) 04/22/2018   GLUCAP 110 (H) 04/22/2018   GLUCAP 116 (H) 04/21/2018   GLUCAP 116 (H) 04/21/2018   GLUCAP 115 (H) 04/21/2018     Exercise Target Goals:    Exercise Program Goal: Individual exercise prescription set using results from initial 6 min walk test and THRR while considering  patient's activity barriers and safety.   Exercise Prescription Goal: Starting with aerobic activity 30 plus minutes a day, 3 days per week for initial exercise prescription. Provide home exercise prescription and guidelines that participant acknowledges understanding prior to discharge.  Activity Barriers & Risk Stratification: Activity Barriers & Cardiac Risk Stratification - 06/02/18 1442      Activity Barriers & Cardiac Risk Stratification   Activity Barriers  None    Cardiac Risk Stratification  High       6 Minute Walk: 6 Minute Walk    Row Name 06/02/18 1441         6 Minute Walk   Phase  Initial     Distance  1300 feet     Distance % Change  0 %     Distance Feet Change  0 ft     Walk Time  6 minutes     # of Rest Breaks  0     MPH  2.46     METS  2.88     RPE  11     Perceived Dyspnea   11     VO2 Peak  10.49     Symptoms  No     Resting HR  77 bpm     Resting BP  150/70     Resting Oxygen Saturation   96 %     Exercise Oxygen Saturation  during 6 min walk  96 %     Max Ex. HR  101 bpm     Max Ex. BP  160/74     2 Minute Post BP  136/70        Oxygen Initial Assessment:   Oxygen Re-Evaluation:   Oxygen Discharge (Final Oxygen Re-Evaluation):   Initial Exercise Prescription: Initial Exercise Prescription - 06/02/18 1400       Date of Initial Exercise RX and Referring Provider   Date  06/02/18    Referring Provider  Dr. Bronson Ing      Treadmill   MPH  1.6    Grade  0    Minutes  15    METs  2.2      Recumbant Elliptical   Level  1    RPM  38    Watts  37    Minutes  20    METs  2      Prescription Details   Frequency (times per week)  3    Duration  Progress to 30 minutes of continuous aerobic without signs/symptoms of physical distress      Intensity   THRR 40-80% of Max Heartrate  [phone]    Ratings of Perceived Exertion  11-13    Perceived Dyspnea  0-4      Progression   Progression  Continue progressive overload as per policy without signs/symptoms or physical distress.      Resistance Training   Training Prescription  Yes    Weight  1    Reps  10-15       Perform Capillary Blood Glucose checks as needed.  Exercise Prescription Changes:  Exercise Prescription Changes    Row Name 06/10/18 0700             Response to Exercise   Blood Pressure (Admit)  140/70       Blood Pressure (Exercise)  154/60       Blood Pressure (Exit)  130/68       Heart Rate (Admit)  84 bpm       Heart Rate (Exercise)  96 bpm       Heart Rate (Exit)  93 bpm       Rating of Perceived Exertion (Exercise)  11       Duration  Progress to 30 minutes of  aerobic without signs/symptoms of physical distress       Intensity  THRR New [phone]         Progression   Progression  Continue to  progress workloads to maintain intensity without signs/symptoms of physical distress.         Resistance Training   Training Prescription  Yes       Weight  1       Reps  10-15         Treadmill   MPH  1.6       Grade  0       Minutes  15       METs  2.2         Recumbant Elliptical   Level  1       RPM  48       Watts  59       Minutes  20       METs  2.9          Exercise Comments:  Exercise Comments    Row Name 06/10/18 0745           Exercise Comments  Patient is doing very well in CR.  Patient has just started the program and will be progressed more in time.           Exercise Goals and Review:  Exercise Goals    Row Name 06/02/18 1443             Exercise Goals   Increase Physical Activity  Yes       Intervention  Provide advice, education, support and counseling about physical activity/exercise needs.;Develop an individualized exercise prescription for aerobic and resistive training based on initial evaluation findings, risk stratification, comorbidities and participant's personal goals.       Expected Outcomes  Short Term: Attend rehab on a regular basis to increase amount of physical activity.       Increase Strength and Stamina  Yes       Intervention  Provide advice, education, support and counseling about physical activity/exercise needs.;Develop an individualized exercise prescription for aerobic and resistive training based on initial evaluation findings, risk stratification, comorbidities and participant's personal goals.       Expected Outcomes  Short Term: Increase workloads from initial exercise prescription for resistance, speed, and METs.       Able to understand and use rate of perceived exertion (RPE) scale  Yes       Intervention  Provide education and explanation on how to use RPE scale       Expected Outcomes  Short Term: Able to use RPE daily in rehab to express subjective intensity level;Long Term:  Able to use RPE to guide intensity level when exercising independently       Able to understand and use Dyspnea scale  Yes       Intervention  Provide education and explanation on how to use Dyspnea scale       Expected Outcomes  Short Term: Able to use Dyspnea scale daily in rehab to express subjective sense of shortness of breath during exertion;Long Term: Able to use Dyspnea scale to guide intensity level when exercising independently       Knowledge and understanding of Target Heart Rate Range (THRR)  Yes       Intervention  Provide education and  explanation of THRR including how the numbers were predicted and where they are located for reference       Expected Outcomes  Short Term: Able to state/look up THRR;Long Term: Able to use THRR to govern intensity when exercising independently;Short Term: Able to use daily as guideline for intensity  in rehab       Able to check pulse independently  Yes       Intervention  Provide education and demonstration on how to check pulse in carotid and radial arteries.;Review the importance of being able to check your own pulse for safety during independent exercise       Expected Outcomes  Short Term: Able to explain why pulse checking is important during independent exercise;Long Term: Able to check pulse independently and accurately       Understanding of Exercise Prescription  Yes       Intervention  Provide education, explanation, and written materials on patient's individual exercise prescription       Expected Outcomes  Short Term: Able to explain program exercise prescription;Long Term: Able to explain home exercise prescription to exercise independently          Exercise Goals Re-Evaluation : Exercise Goals Re-Evaluation    Row Name 06/16/18 0746             Exercise Goal Re-Evaluation   Exercise Goals Review  Increase Physical Activity;Increase Strength and Stamina;Able to understand and use rate of perceived exertion (RPE) scale;Able to check pulse independently;Knowledge and understanding of Target Heart Rate Range (THRR);Improve claudication pain tolerance and improve walking ability;Able to understand and use Dyspnea scale;Understanding of Exercise Prescription       Comments  Patient has stated to me that he is feeling less chest pain since beginning the program, he is breathing better and has much more energy for work.        Expected Outcomes  Patient wishes to get back to work.            Discharge Exercise Prescription (Final Exercise Prescription Changes): Exercise Prescription  Changes - 06/10/18 0700      Response to Exercise   Blood Pressure (Admit)  140/70    Blood Pressure (Exercise)  154/60    Blood Pressure (Exit)  130/68    Heart Rate (Admit)  84 bpm    Heart Rate (Exercise)  96 bpm    Heart Rate (Exit)  93 bpm    Rating of Perceived Exertion (Exercise)  11    Duration  Progress to 30 minutes of  aerobic without signs/symptoms of physical distress    Intensity  THRR New [phone]      Progression   Progression  Continue to progress workloads to maintain intensity without signs/symptoms of physical distress.      Resistance Training   Training Prescription  Yes    Weight  1    Reps  10-15      Treadmill   MPH  1.6    Grade  0    Minutes  15    METs  2.2      Recumbant Elliptical   Level  1    RPM  48    Watts  59    Minutes  20    METs  2.9       Nutrition:  Target Goals: Understanding of nutrition guidelines, daily intake of sodium 1500mg , cholesterol 200mg , calories 30% from fat and 7% or less from saturated fats, daily to have 5 or more servings of fruits and vegetables.  Biometrics: Pre Biometrics - 06/02/18 1444      Pre Biometrics   Height  5' 8.5" (1.74 m)    Weight  212 lb 12.8 oz (96.5 kg)    Waist Circumference  43.5 inches    Hip Circumference  39.5 inches    Waist to Hip Ratio  1.1 %    BMI (Calculated)  31.88    Triceps Skinfold  16 mm    % Body Fat  31 %    Grip Strength  55.33 kg    Flexibility  0 in    Single Leg Stand  60 seconds        Nutrition Therapy Plan and Nutrition Goals: Nutrition Therapy & Goals - 06/18/18 1311      Nutrition Therapy   RD appointment deferred  Yes      Personal Nutrition Goals   Personal Goal #2  Patient says he continues to eat a heart health diet    Additional Goals?  No       Nutrition Assessments: Nutrition Assessments - 06/02/18 1505      MEDFICTS Scores   Pre Score  15       Nutrition Goals Re-Evaluation:   Nutrition Goals Discharge (Final Nutrition  Goals Re-Evaluation):   Psychosocial: Target Goals: Acknowledge presence or absence of significant depression and/or stress, maximize coping skills, provide positive support system. Participant is able to verbalize types and ability to use techniques and skills needed for reducing stress and depression.  Initial Review & Psychosocial Screening: Initial Psych Review & Screening - 06/02/18 1507      Initial Review   Current issues with  None Identified      Family Dynamics   Good Support System?  Yes      Barriers   Psychosocial barriers to participate in program  There are no identifiable barriers or psychosocial needs.      Screening Interventions   Interventions  Encouraged to exercise    Expected Outcomes  Short Term goal: Identification and review with participant of any Quality of Life or Depression concerns found by scoring the questionnaire.;Long Term goal: The participant improves quality of Life and PHQ9 Scores as seen by post scores and/or verbalization of changes       Quality of Life Scores: Quality of Life - 06/02/18 1407      Quality of Life Scores   Health/Function Pre  29.5 %    Socioeconomic Pre  24.64 %    Psych/Spiritual Pre  30 %    Family Pre  30 %    GLOBAL Pre  28.6 %      Scores of 19 and below usually indicate a poorer quality of life in these areas.  A difference of  2-3 points is a clinically meaningful difference.  A difference of 2-3 points in the total score of the Quality of Life Index has been associated with significant improvement in overall quality of life, self-image, physical symptoms, and general health in studies assessing change in quality of life.  PHQ-9: Recent Review Flowsheet Data    Depression screen [facility] 2/9 06/02/2018   Decreased Interest 0   Down, Depressed, Hopeless 0   PHQ - 2 Score 0   Altered sleeping 0   Tired, decreased energy 0   Change in appetite 0   Feeling bad or failure about yourself  0   Trouble concentrating 0    Moving slowly or fidgety/restless 0   Suicidal thoughts 0   PHQ-9 Score 0     Interpretation of Total Score  Total Score Depression Severity:  1-4 = Minimal depression, 5-9 = Mild depression, 10-14 = Moderate depression, 15-19 = Moderately severe depression, 20-27 = Severe depression   Psychosocial Evaluation and Intervention: Psychosocial Evaluation -  06/02/18 1507      Psychosocial Evaluation & Interventions   Interventions  Encouraged to exercise with the program and follow exercise prescription    Continue Psychosocial Services   No Follow up required       Psychosocial Re-Evaluation: Psychosocial Re-Evaluation    Amanda Name 06/18/18 1314             Psychosocial Re-Evaluation   Current issues with  None Identified       Comments  Patient's initial QOL score was 23.70 and his PHQ-9 score was 4 with no psychosocial issues identified.        Expected Outcomes  Patient will have no psychosoical issues identified at discharge.        Interventions  Relaxation education;Encouraged to attend Cardiac Rehabilitation for the exercise;Stress management education       Continue Psychosocial Services   No Follow up required          Psychosocial Discharge (Final Psychosocial Re-Evaluation): Psychosocial Re-Evaluation - 06/18/18 1314      Psychosocial Re-Evaluation   Current issues with  None Identified    Comments  Patient's initial QOL score was 23.70 and his PHQ-9 score was 4 with no psychosocial issues identified.     Expected Outcomes  Patient will have no psychosoical issues identified at discharge.     Interventions  Relaxation education;Encouraged to attend Cardiac Rehabilitation for the exercise;Stress management education    Continue Psychosocial Services   No Follow up required       Vocational Rehabilitation: Provide vocational rehab assistance to qualifying candidates.   Vocational Rehab Evaluation & Intervention: Vocational Rehab - 06/02/18 1504      Initial  Vocational Rehab Evaluation & Intervention   Assessment shows need for Vocational Rehabilitation  No       Education: Education Goals: Education classes will be provided on a weekly basis, covering required topics. Participant will state understanding/return demonstration of topics presented.  Learning Barriers/Preferences: Learning Barriers/Preferences - 06/02/18 1502      Learning Barriers/Preferences   Learning Barriers  None    Learning Preferences  Skilled Demonstration;Individual Instruction;Group Instruction       Education Topics: Hypertension, Hypertension Reduction -Define heart disease and high blood pressure. Discus how high blood pressure affects the body and ways to reduce high blood pressure.   Exercise and Your Heart -Discuss why it is important to exercise, the FITT principles of exercise, normal and abnormal responses to exercise, and how to exercise safely.   Angina -Discuss definition of angina, causes of angina, treatment of angina, and how to decrease risk of having angina.   Cardiac Medications -Review what the following cardiac medications are used for, how they affect the body, and side effects that may occur when taking the medications.  Medications include Aspirin, Beta blockers, calcium channel blockers, ACE Inhibitors, angiotensin receptor blockers, diuretics, digoxin, and antihyperlipidemics.   Congestive Heart Failure -Discuss the definition of CHF, how to live with CHF, the signs and symptoms of CHF, and how keep track of weight and sodium intake.   Heart Disease and Intimacy -Discus the effect sexual activity has on the heart, how changes occur during intimacy as we age, and safety during sexual activity.   Smoking Cessation / COPD -Discuss different methods to quit smoking, the health benefits of quitting smoking, and the definition of COPD.   Nutrition I: Fats -Discuss the types of cholesterol, what cholesterol does to the heart, and  how cholesterol levels can be controlled.  CARDIAC REHAB PHASE II EXERCISE from 06/11/2018 in [place]  Date  06/11/18  Educator  DJ  Instruction Review Code  2- Demonstrated Understanding      Nutrition II: Labels -Discuss the different components of food labels and how to read food label   CARDIAC REHAB PHASE II EXERCISE from 06/11/2018 in [place]  Date  06/04/18  Educator  Dc  Instruction Review Code  2- Demonstrated Understanding      Heart Parts/Heart Disease and PAD -Discuss the anatomy of the heart, the pathway of blood circulation through the heart, and these are affected by heart disease.   Stress I: Signs and Symptoms -Discuss the causes of stress, how stress may lead to anxiety and depression, and ways to limit stress.   Stress II: Relaxation -Discuss different types of relaxation techniques to limit stress.   Warning Signs of Stroke / TIA -Discuss definition of a stroke, what the signs and symptoms are of a stroke, and how to identify when someone is having stroke.   Knowledge Questionnaire Score: Knowledge Questionnaire Score - 06/02/18 1502      Knowledge Questionnaire Score   Pre Score  22/24       Core Components/Risk Factors/Patient Goals at Admission: Personal Goals and Risk Factors at Admission - 06/02/18 1506      Core Components/Risk Factors/Patient Goals on Admission    Weight Management  Weight Maintenance    Personal Goal Other  Yes    Personal Goal  Get strong enough to get back to work    Intervention  Attend CR 3 x week and supplement with home exercise 2 x week    Expected Outcomes  Achieve personal goals.        Core Components/Risk Factors/Patient Goals Review:  Goals and Risk Factor Review    Row Name 06/18/18 1311             Core Components/Risk Factors/Patient Goals Review   Personal Goals Review  Weight Management/Obesity Get back to work.        Review  Patient has  completed 8 sessions maintaining his weight. He is doing well in the program with progression. He says his stamina has increased.  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 Will continue to monitor for progress.        Expected Outcomes  Patient will continue to attend sessions and complete the program meeting his personal needs.           Core Components/Risk Factors/Patient Goals at Discharge (Final Review):  Goals and Risk Factor Review - 06/18/18 1311      Core Components/Risk Factors/Patient Goals Review   Personal Goals Review  Weight Management/Obesity Get back to work.     Review  Patient has completed 8 sessions maintaining his weight. He is doing well in the program with progression. He says his stamina has increased.  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 Will continue to monitor for progress.     Expected Outcomes  Patient will continue to attend sessions and complete the program meeting his personal needs.        ITP Comments:   Comments: ITP 30 Day REVIEW Patient doing well in the program. Will continue to monitor for progress.

## 2018-06-18 NOTE — Progress Notes (Signed)
Daily Session Note  Patient Details  Name: CAMDIN HEGNER MRN: [id number] Date of Birth: [birth year] Referring Provider:     CARDIAC REHAB PHASE II ORIENTATION from 06/02/2018 in [place]  Referring Provider  Dr. Bronson Ing      Encounter Date: 06/18/2018  Check In: Session Check In - 06/18/18 0930      Check-In   Location  [facility]    Staff Present  Suzanne Boron, BS, EP, Exercise Physiologist;Kherington Meraz Wynetta Emery, RN, BSN;Diane Coad, MS, EP, [facility], Exercise Physiologist    Supervising physician immediately available to respond to emergencies  See telemetry face sheet for immediately available MD    Medication changes reported      No    Fall or balance concerns reported     No    Warm-up and Cool-down  Performed as group-led instruction    Resistance Training Performed  Yes    VAD Patient?  No    PAD/SET Patient?  No      Pain Assessment   Currently in Pain?  No/denies    Pain Score  0-No pain    Multiple Pain Sites  No       Capillary Blood Glucose: No results found for this or any previous visit (from the past 24 hour(s)).    Social History   Tobacco Use  Smoking Status Former Smoker  . Packs/day: 1.50  . Years: 15.00  . Pack years: 22.50  . Types: Cigarettes  . Start date: 12/11/1963  . Last attempt to quit: 12/10/1993  . Years since quitting: 24.5  Smokeless Tobacco Never Used  Tobacco Comment   Quit smoking for 13 years at one point    Goals Met:  Independence with exercise equipment Exercise tolerated well No report of cardiac concerns or symptoms Strength training completed today  Goals Unmet:  Not Applicable  Comments: Check out 1030.   Dr. Kate Sable is Medical Director for [facility] Cardiac and Pulmonary Rehab.

## 2018-06-20 ENCOUNTER — Encounter (HOSPITAL_COMMUNITY)
Admission: RE | Admit: 2018-06-20 | Discharge: 2018-06-20 | Disposition: A | Discharge status: Home / Self Care | Payer: Medicare Other | Source: Ambulatory Visit | Attending: Cardiovascular Disease | Admitting: Cardiovascular Disease

## 2018-06-20 DIAGNOSIS — Z951 Presence of aortocoronary bypass graft: Secondary | ICD-10-CM | POA: Diagnosis not present

## 2018-06-20 DIAGNOSIS — Z48812 Encounter for surgical aftercare following surgery on the circulatory system: Secondary | ICD-10-CM | POA: Diagnosis not present

## 2018-06-20 NOTE — Progress Notes (Signed)
Daily Session Note  Patient Details  Name: Albert Peterson MRN: [id number] Date of Birth: Apr 10, 1951 Referring Provider:     CARDIAC REHAB PHASE II ORIENTATION from 06/02/2018 in [place]  Referring Provider  Dr. Bronson Ing      Encounter Date: 06/20/2018  Check In: Session Check In - 06/20/18 0815      Check-In   Location  [facility]    Staff Present  Aundra Dubin, RN, BSN;Diane Coad, MS, EP, [facility], Exercise Physiologist    Supervising physician immediately available to respond to emergencies  See telemetry face sheet for immediately available MD    Medication changes reported      No    Fall or balance concerns reported     No    Warm-up and Cool-down  Performed as group-led instruction    Resistance Training Performed  Yes    VAD Patient?  No    PAD/SET Patient?  No      Pain Assessment   Currently in Pain?  No/denies    Pain Score  0-No pain    Multiple Pain Sites  No       Capillary Blood Glucose: No results found for this or any previous visit (from the past 24 hour(s)).    Social History   Tobacco Use  Smoking Status Former Smoker  . Packs/day: 1.50  . Years: 15.00  . Pack years: 22.50  . Types: Cigarettes  . Start date: 12/11/1963  . Last attempt to quit: 12/10/1993  . Years since quitting: 24.5  Smokeless Tobacco Never Used  Tobacco Comment   Quit smoking for 13 years at one point    Goals Met:  Independence with exercise equipment Exercise tolerated well No report of cardiac concerns or symptoms Strength training completed today  Goals Unmet:  Not Applicable  Comments: Check out 915.   Dr. Kate Sable is Medical Director for [facility] Cardiac and Pulmonary Rehab.

## 2018-06-23 ENCOUNTER — Encounter (HOSPITAL_COMMUNITY)
Admission: RE | Admit: 2018-06-23 | Discharge: 2018-06-23 | Disposition: A | Discharge status: Home / Self Care | Payer: Medicare Other | Source: Ambulatory Visit | Attending: Cardiovascular Disease | Admitting: Cardiovascular Disease

## 2018-06-23 DIAGNOSIS — Z951 Presence of aortocoronary bypass graft: Secondary | ICD-10-CM

## 2018-06-23 DIAGNOSIS — Z48812 Encounter for surgical aftercare following surgery on the circulatory system: Secondary | ICD-10-CM | POA: Diagnosis not present

## 2018-06-23 NOTE — Progress Notes (Signed)
Daily Session Note  Patient Details  Name: Albert Peterson MRN: [id number] Date of Birth: [birth year] Referring Provider:     CARDIAC REHAB PHASE II ORIENTATION from 06/02/2018 in [place]  Referring Provider  Dr. Bronson Ing      Encounter Date: 06/23/2018  Check In: Session Check In - 06/23/18 0930      Check-In   Location  [facility]    Staff Present  Aundra Dubin, RN, BSN;Diane Coad, MS, EP, [facility], Exercise Physiologist    Supervising physician immediately available to respond to emergencies  See telemetry face sheet for immediately available MD    Medication changes reported      No    Fall or balance concerns reported     No    Warm-up and Cool-down  Performed as group-led instruction    Resistance Training Performed  Yes    VAD Patient?  No    PAD/SET Patient?  No      Pain Assessment   Currently in Pain?  No/denies    Pain Score  0-No pain    Multiple Pain Sites  No       Capillary Blood Glucose: No results found for this or any previous visit (from the past 24 hour(s)).    Social History   Tobacco Use  Smoking Status Former Smoker  . Packs/day: 1.50  . Years: 15.00  . Pack years: 22.50  . Types: Cigarettes  . Start date: 12/11/1963  . Last attempt to quit: 12/10/1993  . Years since quitting: 24.5  Smokeless Tobacco Never Used  Tobacco Comment   Quit smoking for 13 years at one point    Goals Met:  Independence with exercise equipment Exercise tolerated well No report of cardiac concerns or symptoms Strength training completed today  Goals Unmet:  Not Applicable  Comments: Check out 1030.   Dr. Kate Sable is Medical Director for [facility] Cardiac and Pulmonary Rehab.

## 2018-06-25 ENCOUNTER — Encounter (HOSPITAL_COMMUNITY)
Admission: RE | Admit: 2018-06-25 | Discharge: 2018-06-25 | Disposition: A | Discharge status: Home / Self Care | Payer: Medicare Other | Source: Ambulatory Visit | Attending: Cardiovascular Disease | Admitting: Cardiovascular Disease

## 2018-06-25 DIAGNOSIS — Z48812 Encounter for surgical aftercare following surgery on the circulatory system: Secondary | ICD-10-CM | POA: Diagnosis not present

## 2018-06-25 DIAGNOSIS — Z951 Presence of aortocoronary bypass graft: Secondary | ICD-10-CM | POA: Diagnosis not present

## 2018-06-25 NOTE — Progress Notes (Signed)
Daily Session Note  Patient Details  Name: Albert Peterson MRN: [id number] Date of Birth: Jun 03, 1951 Referring Provider:     CARDIAC REHAB PHASE II ORIENTATION from 06/02/2018 in [place]  Referring Provider  Dr. Bronson Ing      Encounter Date: 06/25/2018  Check In: Session Check In - 06/25/18 0815      Check-In   Location  [facility]    Staff Present  Russella Dar, MS, EP, [facility], Exercise Physiologist;Debra Wynetta Emery, RN, BSN    Supervising physician immediately available to respond to emergencies  See telemetry face sheet for immediately available MD    Medication changes reported      No    Fall or balance concerns reported     No    Tobacco Cessation  No Change    Warm-up and Cool-down  Performed as group-led instruction    Resistance Training Performed  Yes    VAD Patient?  No    PAD/SET Patient?  No      Pain Assessment   Currently in Pain?  No/denies    Pain Score  0-No pain    Multiple Pain Sites  No       Capillary Blood Glucose: No results found for this or any previous visit (from the past 24 hour(s)).    Social History   Tobacco Use  Smoking Status Former Smoker  . Packs/day: 1.50  . Years: 15.00  . Pack years: 22.50  . Types: Cigarettes  . Start date: 12/11/1963  . Last attempt to quit: 12/10/1993  . Years since quitting: 24.5  Smokeless Tobacco Never Used  Tobacco Comment   Quit smoking for 13 years at one point    Goals Met:  Independence with exercise equipment Exercise tolerated well Personal goals reviewed No report of cardiac concerns or symptoms Strength training completed today  Goals Unmet:  Not Applicable  Comments: Check out: 0915   Dr. Kate Sable is Medical Director for [place] and Pulmonary Rehab.

## 2018-06-27 ENCOUNTER — Encounter (HOSPITAL_COMMUNITY)
Admission: RE | Admit: 2018-06-27 | Discharge: 2018-06-27 | Disposition: A | Discharge status: Home / Self Care | Payer: Medicare Other | Source: Ambulatory Visit | Attending: Cardiovascular Disease | Admitting: Cardiovascular Disease

## 2018-06-27 DIAGNOSIS — Z951 Presence of aortocoronary bypass graft: Secondary | ICD-10-CM | POA: Diagnosis not present

## 2018-06-27 DIAGNOSIS — Z48812 Encounter for surgical aftercare following surgery on the circulatory system: Secondary | ICD-10-CM | POA: Diagnosis not present

## 2018-06-27 NOTE — Progress Notes (Signed)
Daily Session Note  Patient Details  Name: Albert Peterson MRN: [id number] Date of Birth: 1951-08-13 Referring Provider:     CARDIAC REHAB PHASE II ORIENTATION from 06/02/2018 in [place]  Referring Provider  Dr. Bronson Ing      Encounter Date: 06/27/2018  Check In: Session Check In - 06/27/18 0930      Check-In   Location  [facility]    Staff Present  Russella Dar, MS, EP, [facility], Exercise Physiologist;Debra Wynetta Emery, RN, BSN    Supervising physician immediately available to respond to emergencies  See telemetry face sheet for immediately available MD    Medication changes reported      No    Fall or balance concerns reported     No    Tobacco Cessation  No Change    Warm-up and Cool-down  Performed as group-led instruction    Resistance Training Performed  Yes    VAD Patient?  No    PAD/SET Patient?  No      Pain Assessment   Currently in Pain?  No/denies    Pain Score  0-No pain    Multiple Pain Sites  No       Capillary Blood Glucose: No results found for this or any previous visit (from the past 24 hour(s)).    Social History   Tobacco Use  Smoking Status Former Smoker  . Packs/day: 1.50  . Years: 15.00  . Pack years: 22.50  . Types: Cigarettes  . Start date: 12/11/1963  . Last attempt to quit: 12/10/1993  . Years since quitting: 24.5  Smokeless Tobacco Never Used  Tobacco Comment   Quit smoking for 13 years at one point    Goals Met:  Independence with exercise equipment Exercise tolerated well Personal goals reviewed No report of cardiac concerns or symptoms Strength training completed today  Goals Unmet:  Not Applicable  Comments: Check out: 1030   Dr. Kate Sable is Medical Director for [place] and Pulmonary Rehab.

## 2018-06-30 ENCOUNTER — Encounter (HOSPITAL_COMMUNITY)
Admission: RE | Admit: 2018-06-30 | Discharge: 2018-06-30 | Disposition: A | Discharge status: Home / Self Care | Payer: Medicare Other | Source: Ambulatory Visit | Attending: Cardiovascular Disease | Admitting: Cardiovascular Disease

## 2018-06-30 DIAGNOSIS — Z951 Presence of aortocoronary bypass graft: Secondary | ICD-10-CM

## 2018-06-30 DIAGNOSIS — Z48812 Encounter for surgical aftercare following surgery on the circulatory system: Secondary | ICD-10-CM | POA: Diagnosis not present

## 2018-06-30 NOTE — Progress Notes (Signed)
Daily Session Note  Patient Details  Name: SQUARE JOWETT MRN: [id number] Date of Birth: January 24, 1951 Referring Provider:     CARDIAC REHAB PHASE II ORIENTATION from 06/02/2018 in [place]  Referring Provider  Dr. Bronson Ing      Encounter Date: 06/30/2018  Check In: Session Check In - 06/30/18 0930      Check-In   Location  [facility]    Staff Present  Russella Dar, MS, EP, [facility], Exercise Physiologist;Alyene Predmore Wynetta Emery, RN, BSN    Supervising physician immediately available to respond to emergencies  See telemetry face sheet for immediately available MD    Medication changes reported      No    Fall or balance concerns reported     No    Warm-up and Cool-down  Performed as group-led instruction    Resistance Training Performed  Yes    VAD Patient?  No    PAD/SET Patient?  No      Pain Assessment   Currently in Pain?  No/denies    Pain Score  0-No pain    Multiple Pain Sites  No       Capillary Blood Glucose: No results found for this or any previous visit (from the past 24 hour(s)).    Social History   Tobacco Use  Smoking Status Former Smoker  . Packs/day: 1.50  . Years: 15.00  . Pack years: 22.50  . Types: Cigarettes  . Start date: 12/11/1963  . Last attempt to quit: 12/10/1993  . Years since quitting: 24.5  Smokeless Tobacco Never Used  Tobacco Comment   Quit smoking for 13 years at one point    Goals Met:  Independence with exercise equipment Exercise tolerated well No report of cardiac concerns or symptoms Strength training completed today  Goals Unmet:  Not Applicable  Comments: Check out 1030.   Dr. Kate Sable is Medical Director for [facility] Cardiac and Pulmonary Rehab.

## 2018-07-02 ENCOUNTER — Encounter (HOSPITAL_COMMUNITY)
Admission: RE | Admit: 2018-07-02 | Discharge: 2018-07-02 | Disposition: A | Discharge status: Home / Self Care | Payer: Medicare Other | Source: Ambulatory Visit | Attending: Cardiovascular Disease | Admitting: Cardiovascular Disease

## 2018-07-02 DIAGNOSIS — Z951 Presence of aortocoronary bypass graft: Secondary | ICD-10-CM

## 2018-07-02 DIAGNOSIS — Z48812 Encounter for surgical aftercare following surgery on the circulatory system: Secondary | ICD-10-CM | POA: Diagnosis not present

## 2018-07-02 NOTE — Progress Notes (Signed)
Daily Session Note  Patient Details  Name: Albert Peterson MRN: [id number] Date of Birth: November 16, 1951 Referring Provider:     CARDIAC REHAB PHASE II ORIENTATION from 06/02/2018 in [place]  Referring Provider  Dr. Bronson Ing      Encounter Date: 07/02/2018  Check In: Session Check In - 07/02/18 0930      Check-In   Location  [facility]    Staff Present  Russella Dar, MS, EP, [facility], Exercise Physiologist;Gerardo Territo Wynetta Emery, RN, BSN    Supervising physician immediately available to respond to emergencies  See telemetry face sheet for immediately available MD    Medication changes reported      No    Fall or balance concerns reported     No    Warm-up and Cool-down  Performed as group-led instruction    Resistance Training Performed  Yes    VAD Patient?  No    PAD/SET Patient?  No      Pain Assessment   Currently in Pain?  No/denies    Pain Score  0-No pain    Multiple Pain Sites  No       Capillary Blood Glucose: No results found for this or any previous visit (from the past 24 hour(s)).    Social History   Tobacco Use  Smoking Status Former Smoker  . Packs/day: 1.50  . Years: 15.00  . Pack years: 22.50  . Types: Cigarettes  . Start date: 12/11/1963  . Last attempt to quit: 12/10/1993  . Years since quitting: 24.5  Smokeless Tobacco Never Used  Tobacco Comment   Quit smoking for 13 years at one point    Goals Met:  Independence with exercise equipment Exercise tolerated well No report of cardiac concerns or symptoms Strength training completed today  Goals Unmet:  Not Applicable  Comments: Check out 1030.   Dr. Kate Sable is Medical Director for [facility] Cardiac and Pulmonary Rehab.

## 2018-07-04 ENCOUNTER — Encounter (HOSPITAL_COMMUNITY)
Admission: RE | Admit: 2018-07-04 | Discharge: 2018-07-04 | Disposition: A | Discharge status: Home / Self Care | Payer: Medicare Other | Source: Ambulatory Visit | Attending: Cardiovascular Disease | Admitting: Cardiovascular Disease

## 2018-07-04 DIAGNOSIS — Z951 Presence of aortocoronary bypass graft: Secondary | ICD-10-CM

## 2018-07-04 DIAGNOSIS — Z48812 Encounter for surgical aftercare following surgery on the circulatory system: Secondary | ICD-10-CM | POA: Diagnosis not present

## 2018-07-04 NOTE — Progress Notes (Signed)
Daily Session Note  Patient Details  Name: BENECIO KLUGER MRN: [id number] Date of Birth: September 17, 1951 Referring Provider:     CARDIAC REHAB PHASE II ORIENTATION from 06/02/2018 in [place]  Referring Provider  Dr. Bronson Ing      Encounter Date: 07/04/2018  Check In: Session Check In - 07/04/18 0930      Check-In   Supervising physician immediately available to respond to emergencies  See telemetry face sheet for immediately available MD    Location  [facility]    Staff Present  Russella Dar, MS, EP, [facility], Exercise Physiologist;Primo Innis Wynetta Emery, RN, BSN    Medication changes reported      No    Fall or balance concerns reported     No    Warm-up and Cool-down  Performed as group-led instruction    Resistance Training Performed  Yes    VAD Patient?  No    PAD/SET Patient?  No      Pain Assessment   Currently in Pain?  No/denies    Pain Score  0-No pain    Multiple Pain Sites  No       Capillary Blood Glucose: No results found for this or any previous visit (from the past 24 hour(s)).    Social History   Tobacco Use  Smoking Status Former Smoker  . Packs/day: 1.50  . Years: 15.00  . Pack years: 22.50  . Types: Cigarettes  . Start date: 12/11/1963  . Last attempt to quit: 12/10/1993  . Years since quitting: 24.5  Smokeless Tobacco Never Used  Tobacco Comment   Quit smoking for 13 years at one point    Goals Met:  Independence with exercise equipment Exercise tolerated well No report of cardiac concerns or symptoms Strength training completed today  Goals Unmet:  Not Applicable  Comments: Check out 1030.   Dr. Kate Sable is Medical Director for [facility] Cardiac and Pulmonary Rehab.

## 2018-07-07 ENCOUNTER — Encounter (HOSPITAL_COMMUNITY)
Admission: RE | Admit: 2018-07-07 | Discharge: 2018-07-07 | Disposition: A | Discharge status: Home / Self Care | Payer: Medicare Other | Source: Ambulatory Visit | Attending: Cardiovascular Disease | Admitting: Cardiovascular Disease

## 2018-07-07 DIAGNOSIS — Z48812 Encounter for surgical aftercare following surgery on the circulatory system: Secondary | ICD-10-CM | POA: Diagnosis not present

## 2018-07-07 DIAGNOSIS — Z951 Presence of aortocoronary bypass graft: Secondary | ICD-10-CM | POA: Diagnosis not present

## 2018-07-07 NOTE — Progress Notes (Signed)
Daily Session Note  Patient Details  Name: Albert Peterson MRN: [id number] Date of Birth: Feb 08, 1951 Referring Provider:     CARDIAC REHAB PHASE II ORIENTATION from 06/02/2018 in [place]  Referring Provider  Dr. Bronson Ing      Encounter Date: 07/07/2018  Check In: Session Check In - 07/07/18 0930      Check-In   Supervising physician immediately available to respond to emergencies  See telemetry face sheet for immediately available MD    Location  [facility]    Staff Present  Russella Dar, MS, EP, [facility], Exercise Physiologist    Medication changes reported      No    Fall or balance concerns reported     No    Tobacco Cessation  No Change    Warm-up and Cool-down  Performed as group-led instruction    Resistance Training Performed  Yes    VAD Patient?  No    PAD/SET Patient?  No      Pain Assessment   Currently in Pain?  No/denies    Pain Score  0-No pain    Multiple Pain Sites  No       Capillary Blood Glucose: No results found for this or any previous visit (from the past 24 hour(s)).    Social History   Tobacco Use  Smoking Status Former Smoker  . Packs/day: 1.50  . Years: 15.00  . Pack years: 22.50  . Types: Cigarettes  . Start date: 12/11/1963  . Last attempt to quit: 12/10/1993  . Years since quitting: 24.5  Smokeless Tobacco Never Used  Tobacco Comment   Quit smoking for 13 years at one point    Goals Met:  Independence with exercise equipment Exercise tolerated well Personal goals reviewed No report of cardiac concerns or symptoms Strength training completed today  Goals Unmet:  Not Applicable  Comments: Check out: 1030   Dr. Kate Sable is Medical Director for [place] and Pulmonary Rehab.

## 2018-07-09 ENCOUNTER — Encounter (HOSPITAL_COMMUNITY)
Admission: RE | Admit: 2018-07-09 | Discharge: 2018-07-09 | Disposition: A | Discharge status: Home / Self Care | Payer: Medicare Other | Source: Ambulatory Visit | Attending: Cardiovascular Disease | Admitting: Cardiovascular Disease

## 2018-07-09 DIAGNOSIS — Z951 Presence of aortocoronary bypass graft: Secondary | ICD-10-CM | POA: Diagnosis not present

## 2018-07-09 DIAGNOSIS — Z48812 Encounter for surgical aftercare following surgery on the circulatory system: Secondary | ICD-10-CM | POA: Diagnosis not present

## 2018-07-09 NOTE — Progress Notes (Signed)
Daily Session Note  Patient Details  Name: Albert Peterson MRN: [id number] Date of Birth: February 28, 1951 Referring Provider:     CARDIAC REHAB PHASE II ORIENTATION from 06/02/2018 in [place]  Referring Provider  Dr. Bronson Ing      Encounter Date: 07/09/2018  Check In: Session Check In - 07/09/18 0815      Check-In   Supervising physician immediately available to respond to emergencies  See telemetry face sheet for immediately available MD    Location  [facility]    Staff Present  Russella Dar, MS, EP, [facility], Exercise Physiologist    Medication changes reported      No    Fall or balance concerns reported     No    Warm-up and Cool-down  Performed as group-led instruction    Resistance Training Performed  Yes    VAD Patient?  No    PAD/SET Patient?  No      Pain Assessment   Currently in Pain?  No/denies    Pain Score  0-No pain    Multiple Pain Sites  No       Capillary Blood Glucose: No results found for this or any previous visit (from the past 24 hour(s)).    Social History   Tobacco Use  Smoking Status Former Smoker  . Packs/day: 1.50  . Years: 15.00  . Pack years: 22.50  . Types: Cigarettes  . Start date: 12/11/1963  . Last attempt to quit: 12/10/1993  . Years since quitting: 24.5  Smokeless Tobacco Never Used  Tobacco Comment   Quit smoking for 13 years at one point    Goals Met:  Independence with exercise equipment Exercise tolerated well Personal goals reviewed No report of cardiac concerns or symptoms Strength training completed today  Goals Unmet:  Not Applicable  Comments: Check out: 1030   Dr. Kate Sable is Medical Director for [name] and Pulmonary Rehab.

## 2018-07-11 ENCOUNTER — Encounter (HOSPITAL_COMMUNITY)
Admission: RE | Admit: 2018-07-11 | Discharge: 2018-07-11 | Disposition: A | Discharge status: Home / Self Care | Payer: Medicare Other | Source: Ambulatory Visit | Attending: Cardiovascular Disease | Admitting: Cardiovascular Disease

## 2018-07-11 DIAGNOSIS — Z87891 Personal history of nicotine dependence: Secondary | ICD-10-CM | POA: Insufficient documentation

## 2018-07-11 DIAGNOSIS — Z48812 Encounter for surgical aftercare following surgery on the circulatory system: Secondary | ICD-10-CM | POA: Diagnosis not present

## 2018-07-11 DIAGNOSIS — Z4889 Encounter for other specified surgical aftercare: Secondary | ICD-10-CM | POA: Insufficient documentation

## 2018-07-11 DIAGNOSIS — Z951 Presence of aortocoronary bypass graft: Secondary | ICD-10-CM | POA: Diagnosis not present

## 2018-07-11 NOTE — Progress Notes (Signed)
Daily Session Note  Patient Details  Name: Albert Peterson MRN: [id number] Date of Birth: [birth year] Referring Provider:     CARDIAC REHAB PHASE II ORIENTATION from 06/02/2018 in [place]  Referring Provider  Dr. Bronson Ing      Encounter Date: 07/11/2018  Check In: Session Check In - 07/11/18 0930      Check-In   Supervising physician immediately available to respond to emergencies  See telemetry face sheet for immediately available MD    Location  [facility]    Staff Present  Russella Dar, MS, EP, [facility], Exercise Physiologist    Medication changes reported      No    Fall or balance concerns reported     No    Tobacco Cessation  No Change    Warm-up and Cool-down  Performed as group-led instruction    Resistance Training Performed  Yes    VAD Patient?  No    PAD/SET Patient?  No      Pain Assessment   Currently in Pain?  No/denies    Pain Score  0-No pain       Capillary Blood Glucose: No results found for this or any previous visit (from the past 24 hour(s)).    Social History   Tobacco Use  Smoking Status Former Smoker  . Packs/day: 1.50  . Years: 15.00  . Pack years: 22.50  . Types: Cigarettes  . Start date: 12/11/1963  . Last attempt to quit: 12/10/1993  . Years since quitting: 24.6  Smokeless Tobacco Never Used  Tobacco Comment   Quit smoking for 13 years at one point    Goals Met:  Independence with exercise equipment Exercise tolerated well Personal goals reviewed No report of cardiac concerns or symptoms Strength training completed today  Goals Unmet:  Not Applicable  Comments: Check out: 1030   Dr. Kate Sable is Medical Director for [place] and Pulmonary Rehab.

## 2018-07-14 ENCOUNTER — Encounter (HOSPITAL_COMMUNITY)
Admission: RE | Admit: 2018-07-14 | Discharge: 2018-07-14 | Disposition: A | Discharge status: Home / Self Care | Payer: Medicare Other | Source: Ambulatory Visit | Attending: Cardiovascular Disease | Admitting: Cardiovascular Disease

## 2018-07-14 DIAGNOSIS — Z951 Presence of aortocoronary bypass graft: Secondary | ICD-10-CM

## 2018-07-14 DIAGNOSIS — Z48812 Encounter for surgical aftercare following surgery on the circulatory system: Secondary | ICD-10-CM | POA: Diagnosis not present

## 2018-07-14 NOTE — Progress Notes (Signed)
Daily Session Note  Patient Details  Name: Albert Peterson MRN: [id number] Date of Birth: [birth year] Referring Provider:     CARDIAC REHAB PHASE II ORIENTATION from 06/02/2018 in [place]  Referring Provider  Dr. Bronson Ing      Encounter Date: 07/14/2018  Check In: Session Check In - 07/14/18 0930      Check-In   Supervising physician immediately available to respond to emergencies  See telemetry face sheet for immediately available MD    Location  [facility]    Staff Present  Russella Dar, MS, EP, [facility], Exercise Physiologist;Yuvia Plant Wynetta Emery, RN, BSN    Medication changes reported      No    Fall or balance concerns reported     No    Warm-up and Cool-down  Performed as group-led Higher education careers adviser Performed  Yes    VAD Patient?  No    PAD/SET Patient?  No      Pain Assessment   Currently in Pain?  No/denies    Pain Score  0-No pain    Multiple Pain Sites  No       Capillary Blood Glucose: No results found for this or any previous visit (from the past 24 hour(s)).  Exercise Prescription Changes - 07/14/18 0700      Response to Exercise   Blood Pressure (Admit)  120/72    Blood Pressure (Exercise)  140/70    Blood Pressure (Exit)  118/66    Heart Rate (Admit)  65 bpm    Heart Rate (Exercise)  104 bpm    Heart Rate (Exit)  82 bpm    Rating of Perceived Exertion (Exercise)  12    Duration  Progress to 30 minutes of  aerobic without signs/symptoms of physical distress    Intensity  THRR New 100-117-135      Progression   Progression  Continue to progress workloads to maintain intensity without signs/symptoms of physical distress.    Average METs  3.2      Resistance Training   Training Prescription  Yes    Weight  4    Reps  10-15    Time  5 Minutes      Treadmill   MPH  2.5    Grade  0    Minutes  15    METs  2.9      Recumbant Elliptical   Level  2    RPM  49    Watts  63    Minutes  20    METs   3.5      Home Exercise Plan   Plans to continue exercise at  Home (comment)    Frequency  Add 2 additional days to program exercise sessions.    Initial Home Exercises Provided  06/30/18       Social History   Tobacco Use  Smoking Status Former Smoker  . Packs/day: 1.50  . Years: 15.00  . Pack years: 22.50  . Types: Cigarettes  . Start date: 12/11/1963  . Last attempt to quit: 12/10/1993  . Years since quitting: 24.6  Smokeless Tobacco Never Used  Tobacco Comment   Quit smoking for 13 years at one point    Goals Met:  Independence with exercise equipment Exercise tolerated well No report of cardiac concerns or symptoms Strength training completed today  Goals Unmet:  Not Applicable  Comments: Check out 1030.   Dr. Kate Sable is Medical Director for  [facility] Cardiac and Pulmonary Rehab.

## 2018-07-16 ENCOUNTER — Encounter (HOSPITAL_COMMUNITY)
Admission: RE | Admit: 2018-07-16 | Discharge: 2018-07-16 | Disposition: A | Discharge status: Home / Self Care | Payer: Medicare Other | Source: Ambulatory Visit | Attending: Cardiovascular Disease | Admitting: Cardiovascular Disease

## 2018-07-16 DIAGNOSIS — I952 Hypotension due to drugs: Secondary | ICD-10-CM | POA: Diagnosis not present

## 2018-07-16 DIAGNOSIS — Z48812 Encounter for surgical aftercare following surgery on the circulatory system: Secondary | ICD-10-CM | POA: Diagnosis not present

## 2018-07-16 DIAGNOSIS — Z951 Presence of aortocoronary bypass graft: Secondary | ICD-10-CM

## 2018-07-16 DIAGNOSIS — I10 Essential (primary) hypertension: Secondary | ICD-10-CM | POA: Diagnosis not present

## 2018-07-16 DIAGNOSIS — E782 Mixed hyperlipidemia: Secondary | ICD-10-CM | POA: Diagnosis not present

## 2018-07-16 NOTE — Progress Notes (Signed)
Cardiac Individual Treatment Plan  Patient Details  Name: Albert Peterson MRN: [id number] Date of Birth: [birth year] Referring Provider:     CARDIAC REHAB PHASE II ORIENTATION from 06/02/2018 in [place]  Referring Provider  Dr. Bronson Ing      Initial Encounter Date:    CARDIAC REHAB PHASE II ORIENTATION from 06/02/2018 in [place]  Date  06/02/18      Visit Diagnosis: S/P CABG x 4  Patient's Home Medications on Admission:  Current Outpatient Medications:  .  aspirin EC 325 MG EC tablet, Take 1 tablet (325 mg total) by mouth daily., Disp: , Rfl:  .  benazepril (LOTENSIN) 5 MG tablet, Take 5 mg by mouth daily., Disp: , Rfl:  .  hydrALAZINE (APRESOLINE) 25 MG tablet, Take 25 mg by mouth 2 (two) times daily., Disp: , Rfl:  .  metoprolol tartrate (LOPRESSOR) 25 MG tablet, Take 0.5 tablets (12.5 mg total) by mouth 2 (two) times daily., Disp: 30 tablet, Rfl: 1 .  rosuvastatin (CRESTOR) 20 MG tablet, Take 1 tablet (20 mg total) by mouth at bedtime., Disp: 30 tablet, Rfl: 1  Past Medical History: Past Medical History:  Diagnosis Date  . Benign essential hypertension   . Diverticulitis   . DM2 (diabetes mellitus, type 2) ([place])   . Eczema   . Hematuria   . Hypercholesteremia   . Hyperlipidemia   . Nephrolithiasis    Per patient "kidney cysts" not stones  . Psoriasis    right leg  . SOB (shortness of breath)     Tobacco Use: Social History   Tobacco Use  Smoking Status Former Smoker  . Packs/day: 1.50  . Years: 15.00  . Pack years: 22.50  . Types: Cigarettes  . Start date: 12/11/1963  . Last attempt to quit: 12/10/1993  . Years since quitting: 24.6  Smokeless Tobacco Never Used  Tobacco Comment   Quit smoking for 13 years at one point    Labs: Recent Review Flowsheet Data    Labs for ITP Cardiac and Pulmonary Rehab Latest Ref Rng & Units 04/16/2018 04/16/2018 04/16/2018 04/16/2018 04/17/2018   Hemoglobin A1c 4.8 - 5.6 % - - - - -    PHART 7.350 - 7.450 7.299(L) 7.378 - 7.381 -   PCO2ART 32.0 - 48.0 mmHg 44.7 45.8 - 44.3 -   HCO3 20.0 - 28.0 mmol/L 21.9 26.8 - 26.1 -   TCO2 22 - 32 mmol/L 23 28 25 27 26    ACIDBASEDEF 0.0 - 2.0 mmol/L 4.0(H) - - - -   O2SAT % 96.0 96.0 - 92.0 -      Capillary Blood Glucose: Lab Results  Component Value Date   GLUCAP 114 (H) 04/22/2018   GLUCAP 110 (H) 04/22/2018   GLUCAP 116 (H) 04/21/2018   GLUCAP 116 (H) 04/21/2018   GLUCAP 115 (H) 04/21/2018     Exercise Target Goals:    Exercise Program Goal: Individual exercise prescription set using results from initial 6 min walk test and THRR while considering  patient's activity barriers and safety.   Exercise Prescription Goal: Starting with aerobic activity 30 plus minutes a day, 3 days per week for initial exercise prescription. Provide home exercise prescription and guidelines that participant acknowledges understanding prior to discharge.  Activity Barriers & Risk Stratification: Activity Barriers & Cardiac Risk Stratification - 06/02/18 1442      Activity Barriers & Cardiac Risk Stratification   Activity Barriers  None    Cardiac Risk Stratification  High       6 Minute Walk: 6 Minute Walk    Row Name 06/02/18 1441         6 Minute Walk   Phase  Initial     Distance  1300 feet     Distance % Change  0 %     Distance Feet Change  0 ft     Walk Time  6 minutes     # of Rest Breaks  0     MPH  2.46     METS  2.88     RPE  11     Perceived Dyspnea   11     VO2 Peak  10.49     Symptoms  No     Resting HR  77 bpm     Resting BP  150/70     Resting Oxygen Saturation   96 %     Exercise Oxygen Saturation  during 6 min walk  96 %     Max Ex. HR  101 bpm     Max Ex. BP  160/74     2 Minute Post BP  136/70        Oxygen Initial Assessment:   Oxygen Re-Evaluation:   Oxygen Discharge (Final Oxygen Re-Evaluation):   Initial Exercise Prescription: Initial Exercise Prescription - 06/02/18 1400       Date of Initial Exercise RX and Referring Provider   Date  06/02/18    Referring Provider  Dr. Bronson Ing      Treadmill   MPH  1.6    Grade  0    Minutes  15    METs  2.2      Recumbant Elliptical   Level  1    RPM  38    Watts  37    Minutes  20    METs  2      Prescription Details   Frequency (times per week)  3    Duration  Progress to 30 minutes of continuous aerobic without signs/symptoms of physical distress      Intensity   THRR 40-80% of Max Heartrate  107-122-137    Ratings of Perceived Exertion  11-13    Perceived Dyspnea  0-4      Progression   Progression  Continue progressive overload as per policy without signs/symptoms or physical distress.      Resistance Training   Training Prescription  Yes    Weight  1    Reps  10-15       Perform Capillary Blood Glucose checks as needed.  Exercise Prescription Changes:  Exercise Prescription Changes    Row Name 06/10/18 0700 06/28/18 1400 07/14/18 0700         Response to Exercise   Blood Pressure (Admit)  140/70  128/66  120/72     Blood Pressure (Exercise)  154/60  146/68  140/70     Blood Pressure (Exit)  130/68  116/72  118/66     Heart Rate (Admit)  84 bpm  71 bpm  65 bpm     Heart Rate (Exercise)  96 bpm  98 bpm  104 bpm     Heart Rate (Exit)  93 bpm  79 bpm  82 bpm     Rating of Perceived Exertion (Exercise)  11  11  12      Duration  Progress to 30 minutes of  aerobic without signs/symptoms of physical distress  Progress to 30 minutes of  aerobic without signs/symptoms of physical distress  Progress to 30 minutes of  aerobic without signs/symptoms of physical distress     Intensity  THRR New 119-137-154  THRR New 103-120-136  THRR New 100-117-135       Progression   Progression  Continue to progress workloads to maintain intensity without signs/symptoms of physical distress.  Continue to progress workloads to maintain intensity without signs/symptoms of physical distress.  Continue to progress  workloads to maintain intensity without signs/symptoms of physical distress.     Average METs  -  3.3  3.2       Resistance Training   Training Prescription  Yes  Yes  Yes     Weight  1  2  4      Reps  10-15  10-15  10-15     Time  -  5 Minutes  5 Minutes       Treadmill   MPH  1.6  2.1  2.5     Grade  0  0  0     Minutes  15  15  15      METs  2.2  2.6  2.9       Recumbant Elliptical   Level  1  1  2      RPM  48  56  49     Watts  59  71  63     Minutes  20  20  20      METs  2.9  4  3.5       Home Exercise Plan   Plans to continue exercise at  -  Home (comment)  Home (comment)     Frequency  -  Add 2 additional days to program exercise sessions.  Add 2 additional days to program exercise sessions.     Initial Home Exercises Provided  -  06/30/18  06/30/18        Exercise Comments:  Exercise Comments    Row Name 06/10/18 0745 07/14/18 0736         Exercise Comments  Patient is doing very well in CR. Patient has just started the program and will be progressed more in time.   Patient has progressed at a steady rate. He has been able to handle the increases in equipment and handheld weights. 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          Exercise Goals and Review:  Exercise Goals    Row Name 06/02/18 1443             Exercise Goals   Increase Physical Activity  Yes       Intervention  Provide advice, education, support and counseling about physical activity/exercise needs.;Develop an individualized exercise prescription for aerobic and resistive training based on initial evaluation findings, risk stratification, comorbidities and participant's personal goals.       Expected Outcomes  Short Term: Attend rehab on a regular basis to increase amount of physical activity.       Increase Strength and Stamina  Yes       Intervention  Provide advice, education, support and counseling about physical activity/exercise needs.;Develop an  individualized exercise prescription for aerobic and resistive training based on initial evaluation findings, risk stratification, comorbidities and participant's personal goals.       Expected Outcomes  Short Term: Increase workloads from initial exercise prescription for resistance, speed, and METs.       Able to understand and  use rate of perceived exertion (RPE) scale  Yes       Intervention  Provide education and explanation on how to use RPE scale       Expected Outcomes  Short Term: Able to use RPE daily in rehab to express subjective intensity level;Long Term:  Able to use RPE to guide intensity level when exercising independently       Able to understand and use Dyspnea scale  Yes       Intervention  Provide education and explanation on how to use Dyspnea scale       Expected Outcomes  Short Term: Able to use Dyspnea scale daily in rehab to express subjective sense of shortness of breath during exertion;Long Term: Able to use Dyspnea scale to guide intensity level when exercising independently       Knowledge and understanding of Target Heart Rate Range (THRR)  Yes       Intervention  Provide education and explanation of THRR including how the numbers were predicted and where they are located for reference       Expected Outcomes  Short Term: Able to state/look up THRR;Long Term: Able to use THRR to govern intensity when exercising independently;Short Term: Able to use daily as guideline for intensity in rehab       Able to check pulse independently  Yes       Intervention  Provide education and demonstration on how to check pulse in carotid and radial arteries.;Review the importance of being able to check your own pulse for safety during independent exercise       Expected Outcomes  Short Term: Able to explain why pulse checking is important during independent exercise;Long Term: Able to check pulse independently and accurately       Understanding of Exercise Prescription  Yes        Intervention  Provide education, explanation, and written materials on patient's individual exercise prescription       Expected Outcomes  Short Term: Able to explain program exercise prescription;Long Term: Able to explain home exercise prescription to exercise independently          Exercise Goals Re-Evaluation : Exercise Goals Re-Evaluation    Row Name 06/16/18 0746 07/14/18 0732           Exercise Goal Re-Evaluation   Exercise Goals Review  Increase Physical Activity;Increase Strength and Stamina;Able to understand and use rate of perceived exertion (RPE) scale;Able to check pulse independently;Knowledge and understanding of Target Heart Rate Range (THRR);Improve claudication pain tolerance and improve walking ability;Able to understand and use Dyspnea scale;Understanding of Exercise Prescription  Increase Strength and Stamina      Comments  Patient has stated to me that he is feeling less chest pain since beginning the program, he is breathing better and has much more energy for work.   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       Expected Outcomes  Patient wishes to get back to work.   Get back to car shows. Get back to work.           Discharge Exercise Prescription (Final Exercise Prescription Changes): Exercise Prescription Changes - 07/14/18 0700      Response to Exercise   Blood Pressure (Admit)  120/72    Blood Pressure (Exercise)  140/70    Blood Pressure (Exit)  118/66    Heart Rate (Admit)  65  bpm    Heart Rate (Exercise)  104 bpm    Heart Rate (Exit)  82 bpm    Rating of Perceived Exertion (Exercise)  12    Duration  Progress to 30 minutes of  aerobic without signs/symptoms of physical distress    Intensity  THRR New 100-117-135      Progression   Progression  Continue to progress workloads to maintain intensity without signs/symptoms of physical distress.    Average METs  3.2      Resistance  Training   Training Prescription  Yes    Weight  4    Reps  10-15    Time  5 Minutes      Treadmill   MPH  2.5    Grade  0    Minutes  15    METs  2.9      Recumbant Elliptical   Level  2    RPM  49    Watts  63    Minutes  20    METs  3.5      Home Exercise Plan   Plans to continue exercise at  Home (comment)    Frequency  Add 2 additional days to program exercise sessions.    Initial Home Exercises Provided  06/30/18       Nutrition:  Target Goals: Understanding of nutrition guidelines, daily intake of sodium 1500mg , cholesterol 200mg , calories 30% from fat and 7% or less from saturated fats, daily to have 5 or more servings of fruits and vegetables.  Biometrics: Pre Biometrics - 06/02/18 1444      Pre Biometrics   Height  5' 8.5" (1.74 m)    Weight  212 lb 12.8 oz (96.5 kg)    Waist Circumference  43.5 inches    Hip Circumference  39.5 inches    Waist to Hip Ratio  1.1 %    BMI (Calculated)  31.88    Triceps Skinfold  16 mm    % Body Fat  31 %    Grip Strength  55.33 kg    Flexibility  0 in    Single Leg Stand  60 seconds        Nutrition Therapy Plan and Nutrition Goals: Nutrition Therapy & Goals - 07/16/18 1510      Nutrition Therapy   RD appointment deferred  Yes      Personal Nutrition Goals   Personal Goal #2  Patient says he continues to eat a heart health diet    Additional Goals?  No       Nutrition Assessments: Nutrition Assessments - 06/02/18 1505      MEDFICTS Scores   Pre Score  15       Nutrition Goals Re-Evaluation:   Nutrition Goals Discharge (Final Nutrition Goals Re-Evaluation):   Psychosocial: Target Goals: Acknowledge presence or absence of significant depression and/or stress, maximize coping skills, provide positive support system. Participant is able to verbalize types and ability to use techniques and skills needed for reducing stress and depression.  Initial Review & Psychosocial Screening: Initial Psych Review  & Screening - 06/02/18 1507      Initial Review   Current issues with  None Identified      Family Dynamics   Good Support System?  Yes      Barriers   Psychosocial barriers to participate in program  There are no identifiable barriers or psychosocial needs.      Screening Interventions   Interventions  Encouraged to  exercise    Expected Outcomes  Short Term goal: Identification and review with participant of any Quality of Life or Depression concerns found by scoring the questionnaire.;Long Term goal: The participant improves quality of Life and PHQ9 Scores as seen by post scores and/or verbalization of changes       Quality of Life Scores: Quality of Life - 06/02/18 1407      Quality of Life Scores   Health/Function Pre  29.5 %    Socioeconomic Pre  24.64 %    Psych/Spiritual Pre  30 %    Family Pre  30 %    GLOBAL Pre  28.6 %      Scores of 19 and below usually indicate a poorer quality of life in these areas.  A difference of  2-3 points is a clinically meaningful difference.  A difference of 2-3 points in the total score of the Quality of Life Index has been associated with significant improvement in overall quality of life, self-image, physical symptoms, and general health in studies assessing change in quality of life.  PHQ-9: Recent Review Flowsheet Data    Depression screen [facility] 2/9 06/02/2018   Decreased Interest 0   Down, Depressed, Hopeless 0   PHQ - 2 Score 0   Altered sleeping 0   Tired, decreased energy 0   Change in appetite 0   Feeling bad or failure about yourself  0   Trouble concentrating 0   Moving slowly or fidgety/restless 0   Suicidal thoughts 0   PHQ-9 Score 0     Interpretation of Total Score  Total Score Depression Severity:  1-4 = Minimal depression, 5-9 = Mild depression, 10-14 = Moderate depression, 15-19 = Moderately severe depression, 20-27 = Severe depression   Psychosocial Evaluation and Intervention: Psychosocial Evaluation -  06/02/18 1507      Psychosocial Evaluation & Interventions   Interventions  Encouraged to exercise with the program and follow exercise prescription    Continue Psychosocial Services   No Follow up required       Psychosocial Re-Evaluation: Psychosocial Re-Evaluation    [place] Name 06/18/18 1314             Psychosocial Re-Evaluation   Current issues with  None Identified       Comments  Patient's initial QOL score was 23.70 and his PHQ-9 score was 4 with no psychosocial issues identified.        Expected Outcomes  Patient will have no psychosoical issues identified at discharge.        Interventions  Relaxation education;Encouraged to attend Cardiac Rehabilitation for the exercise;Stress management education       Continue Psychosocial Services   No Follow up required          Psychosocial Discharge (Final Psychosocial Re-Evaluation): Psychosocial Re-Evaluation - 06/18/18 1314      Psychosocial Re-Evaluation   Current issues with  None Identified    Comments  Patient's initial QOL score was 23.70 and his PHQ-9 score was 4 with no psychosocial issues identified.     Expected Outcomes  Patient will have no psychosoical issues identified at discharge.     Interventions  Relaxation education;Encouraged to attend Cardiac Rehabilitation for the exercise;Stress management education    Continue Psychosocial Services   No Follow up required       Vocational Rehabilitation: Provide vocational rehab assistance to qualifying candidates.   Vocational Rehab Evaluation & Intervention: Vocational Rehab - 06/02/18 1504      Initial  Vocational Rehab Evaluation & Intervention   Assessment shows need for Vocational Rehabilitation  No       Education: Education Goals: Education classes will be provided on a weekly basis, covering required topics. Participant will state understanding/return demonstration of topics presented.  Learning Barriers/Preferences: Learning Barriers/Preferences -  06/02/18 1502      Learning Barriers/Preferences   Learning Barriers  None    Learning Preferences  Skilled Demonstration;Individual Instruction;Group Instruction       Education Topics: Hypertension, Hypertension Reduction -Define heart disease and high blood pressure. Discus how high blood pressure affects the body and ways to reduce high blood pressure.   CARDIAC REHAB PHASE II EXERCISE from 07/16/2018 in [place]  Date  07/16/18  Educator  DC  Instruction Review Code  2- Demonstrated Understanding      Exercise and Your Heart -Discuss why it is important to exercise, the FITT principles of exercise, normal and abnormal responses to exercise, and how to exercise safely.   Angina -Discuss definition of angina, causes of angina, treatment of angina, and how to decrease risk of having angina.   Cardiac Medications -Review what the following cardiac medications are used for, how they affect the body, and side effects that may occur when taking the medications.  Medications include Aspirin, Beta blockers, calcium channel blockers, ACE Inhibitors, angiotensin receptor blockers, diuretics, digoxin, and antihyperlipidemics.   Congestive Heart Failure -Discuss the definition of CHF, how to live with CHF, the signs and symptoms of CHF, and how keep track of weight and sodium intake.   Heart Disease and Intimacy -Discus the effect sexual activity has on the heart, how changes occur during intimacy as we age, and safety during sexual activity.   Smoking Cessation / COPD -Discuss different methods to quit smoking, the health benefits of quitting smoking, and the definition of COPD.   Nutrition I: Fats -Discuss the types of cholesterol, what cholesterol does to the heart, and how cholesterol levels can be controlled.   CARDIAC REHAB PHASE II EXERCISE from 07/16/2018 in [place]  Date  06/11/18  Educator  DJ  Instruction Review Code  2-  Demonstrated Understanding      Nutrition II: Labels -Discuss the different components of food labels and how to read food label   CARDIAC REHAB PHASE II EXERCISE from 07/16/2018 in [place]  Date  06/04/18  Educator  Dc  Instruction Review Code  2- Demonstrated Understanding      Heart Parts/Heart Disease and PAD -Discuss the anatomy of the heart, the pathway of blood circulation through the heart, and these are affected by heart disease.   CARDIAC REHAB PHASE II EXERCISE from 07/16/2018 in [place]  Date  06/18/18  Educator  DC  Instruction Review Code  2- Demonstrated Understanding      Stress I: Signs and Symptoms -Discuss the causes of stress, how stress may lead to anxiety and depression, and ways to limit stress.   CARDIAC REHAB PHASE II EXERCISE from 07/16/2018 in [place]  Date  06/25/18  Educator  D. Coad  Instruction Review Code  2- Demonstrated Understanding      Stress II: Relaxation -Discuss different types of relaxation techniques to limit stress.   CARDIAC REHAB PHASE II EXERCISE from 07/16/2018 in [place]  Date  07/02/18  Educator  DC  Instruction Review Code  2- Demonstrated Understanding      Warning Signs of Stroke /  TIA -Discuss definition of a stroke, what the signs and symptoms are of a stroke, and how to identify when someone is having stroke.   CARDIAC REHAB PHASE II EXERCISE from 07/16/2018 in [place]  Date  07/09/18  Educator  DC  Instruction Review Code  2- Demonstrated Understanding      Knowledge Questionnaire Score: Knowledge Questionnaire Score - 06/02/18 1502      Knowledge Questionnaire Score   Pre Score  22/24       Core Components/Risk Factors/Patient Goals at Admission: Personal Goals and Risk Factors at Admission - 06/02/18 1506      Core Components/Risk Factors/Patient Goals on Admission    Weight  Management  Weight Maintenance    Personal Goal Other  Yes    Personal Goal  Get strong enough to get back to work    Intervention  Attend CR 3 x week and supplement with home exercise 2 x week    Expected Outcomes  Achieve personal goals.        Core Components/Risk Factors/Patient Goals Review:  Goals and Risk Factor Review    Row Name 06/18/18 1311 07/16/18 1510           Core Components/Risk Factors/Patient Goals Review   Personal Goals Review  Weight Management/Obesity Get back to work.   Weight Management/Obesity Get back to work.       Review  Patient has completed 8 sessions maintaining his weight. He is doing well in the program with progression. He says his stamina has increased.  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 Will continue to monitor for progress.   Patient has completed 20 sessions gaining 3 lbs since last 30 day review. He continues to do well in the program with progression. He says he feels stronger and is able to all he wants to do without difficulty. He feels like he is stronger enough to go back to work. He is waiting on MD to release him. He is pleased with his progress and feels the program is helping him overall. Will continue to monitor for progress.       Expected Outcomes  Patient will continue to attend sessions and complete the program meeting his personal needs.   Patient will continue to attend sessions and complete the program meeting his personal needs.          Core Components/Risk Factors/Patient Goals at Discharge (Final Review):  Goals and Risk Factor Review - 07/16/18 1510      Core Components/Risk Factors/Patient Goals Review   Personal Goals Review  Weight Management/Obesity Get back to work.     Review  Patient has completed 20 sessions gaining 3 lbs since last 30 day review. He continues to do well in the program with progression. He says he feels stronger and is able to all he wants to do  without difficulty. He feels like he is stronger enough to go back to work. He is waiting on MD to release him. He is pleased with his progress and feels the program is helping him overall. Will continue to monitor for progress.     Expected Outcomes  Patient will continue to attend sessions and complete the program meeting his personal needs.        ITP Comments:   Comments: ITP 30 Day REVIEW Patient doing well in the program. Will continue to monitor for progress.

## 2018-07-16 NOTE — Progress Notes (Signed)
Daily Session Note  Patient Details  Name: Albert Peterson MRN: [id number] Date of Birth: 1951-11-14 Referring Provider:     CARDIAC REHAB PHASE II ORIENTATION from 06/02/2018 in [place]  Referring Provider  Dr. Bronson Ing      Encounter Date: 07/16/2018  Check In: Session Check In - 07/16/18 1014      Check-In   Supervising physician immediately available to respond to emergencies  See telemetry face sheet for immediately available MD    Location  [facility]    Staff Present  Russella Dar, MS, EP, [facility], Exercise Physiologist;Other    Medication changes reported      No    Fall or balance concerns reported     No    Tobacco Cessation  No Change    Warm-up and Cool-down  Performed as group-led instruction    Resistance Training Performed  Yes    VAD Patient?  No    PAD/SET Patient?  No      Pain Assessment   Currently in Pain?  No/denies    Pain Score  0-No pain    Multiple Pain Sites  No       Capillary Blood Glucose: No results found for this or any previous visit (from the past 24 hour(s)).    Social History   Tobacco Use  Smoking Status Former Smoker  . Packs/day: 1.50  . Years: 15.00  . Pack years: 22.50  . Types: Cigarettes  . Start date: 12/11/1963  . Last attempt to quit: 12/10/1993  . Years since quitting: 24.6  Smokeless Tobacco Never Used  Tobacco Comment   Quit smoking for 13 years at one point    Goals Met:  Independence with exercise equipment Exercise tolerated well No report of cardiac concerns or symptoms Strength training completed today  Goals Unmet:  Not Applicable  Comments: Pt able to follow exercise prescription today without complaint.  Will continue to monitor for progression. Checked out at 10:30.   Dr. Kate Sable is Medical Director for [facility] Cardiac and Pulmonary Rehab.

## 2018-07-18 ENCOUNTER — Encounter (HOSPITAL_COMMUNITY)
Admission: RE | Admit: 2018-07-18 | Discharge: 2018-07-18 | Disposition: A | Discharge status: Home / Self Care | Payer: Medicare Other | Source: Ambulatory Visit | Attending: Cardiovascular Disease | Admitting: Cardiovascular Disease

## 2018-07-18 DIAGNOSIS — Z951 Presence of aortocoronary bypass graft: Secondary | ICD-10-CM

## 2018-07-18 DIAGNOSIS — Z48812 Encounter for surgical aftercare following surgery on the circulatory system: Secondary | ICD-10-CM | POA: Diagnosis not present

## 2018-07-18 NOTE — Progress Notes (Signed)
Daily Session Note  Patient Details  Name: Albert Peterson MRN: [id number] Date of Birth: [birth year] Referring Provider:     CARDIAC REHAB PHASE II ORIENTATION from 06/02/2018 in [employer]  Referring Provider  Dr. Bronson Ing      Encounter Date: 07/18/2018  Check In: Session Check In - 07/18/18 1409      Check-In   Supervising physician immediately available to respond to emergencies  See telemetry face sheet for immediately available MD    Location  [facility]    Staff Present  Russella Dar, MS, EP, [facility], Exercise Physiologist;Debra Wynetta Emery, RN, BSN    Medication changes reported      No    Fall or balance concerns reported     No    Tobacco Cessation  No Change    Warm-up and Cool-down  Performed as group-led instruction    Resistance Training Performed  Yes    VAD Patient?  No    PAD/SET Patient?  No      Pain Assessment   Currently in Pain?  No/denies    Pain Score  0-No pain    Multiple Pain Sites  No       Capillary Blood Glucose: No results found for this or any previous visit (from the past 24 hour(s)).    Social History   Tobacco Use  Smoking Status Former Smoker  . Packs/day: 1.50  . Years: 15.00  . Pack years: 22.50  . Types: Cigarettes  . Start date: 12/11/1963  . Last attempt to quit: 12/10/1993  . Years since quitting: 24.6  Smokeless Tobacco Never Used  Tobacco Comment   Quit smoking for 13 years at one point    Goals Met:  Independence with exercise equipment Exercise tolerated well Personal goals reviewed No report of cardiac concerns or symptoms Strength training completed today  Goals Unmet:  Not Applicable  Comments: Pt able to follow exercise prescription today without complaint.  Will continue to monitor for progression. Check out: 1030    Dr. Kate Sable is Medical Director for [facility] Cardiac and Pulmonary Rehab.

## 2018-07-21 ENCOUNTER — Encounter (HOSPITAL_COMMUNITY)
Admission: RE | Admit: 2018-07-21 | Discharge: 2018-07-21 | Disposition: A | Discharge status: Home / Self Care | Payer: Medicare Other | Source: Ambulatory Visit | Attending: Cardiovascular Disease | Admitting: Cardiovascular Disease

## 2018-07-21 DIAGNOSIS — Z48812 Encounter for surgical aftercare following surgery on the circulatory system: Secondary | ICD-10-CM | POA: Diagnosis not present

## 2018-07-21 DIAGNOSIS — Z951 Presence of aortocoronary bypass graft: Secondary | ICD-10-CM

## 2018-07-21 NOTE — Progress Notes (Signed)
Daily Session Note  Patient Details  Name: DUNBAR BURAS MRN: [id number] Date of Birth: 1950/12/22 Referring Provider:     CARDIAC REHAB PHASE II ORIENTATION from 06/02/2018 in [place]  Referring Provider  Dr. Bronson Ing      Encounter Date: 07/21/2018  Check In: Session Check In - 07/21/18 0930      Check-In   Supervising physician immediately available to respond to emergencies  See telemetry face sheet for immediately available MD    Location  [facility]    Staff Present  Russella Dar, MS, EP, [facility], Exercise Physiologist;Debra Wynetta Emery, RN, BSN    Medication changes reported      No    Fall or balance concerns reported     No    Tobacco Cessation  No Change    Warm-up and Cool-down  Performed as group-led instruction    Resistance Training Performed  Yes    VAD Patient?  No    PAD/SET Patient?  No      Pain Assessment   Currently in Pain?  No/denies    Pain Score  0-No pain    Multiple Pain Sites  No       Capillary Blood Glucose: No results found for this or any previous visit (from the past 24 hour(s)).    Social History   Tobacco Use  Smoking Status Former Smoker  . Packs/day: 1.50  . Years: 15.00  . Pack years: 22.50  . Types: Cigarettes  . Start date: 12/11/1963  . Last attempt to quit: 12/10/1993  . Years since quitting: 24.6  Smokeless Tobacco Never Used  Tobacco Comment   Quit smoking for 13 years at one point    Goals Met:  Independence with exercise equipment Exercise tolerated well Personal goals reviewed No report of cardiac concerns or symptoms Strength training completed today  Goals Unmet:  Not Applicable  Comments: Pt able to follow exercise prescription today without complaint.  Will continue to monitor for progression. Check out: 1030   Dr. Kate Sable is Medical Director for [facility] Cardiac and Pulmonary Rehab.

## 2018-07-23 ENCOUNTER — Encounter (HOSPITAL_COMMUNITY)
Admission: RE | Admit: 2018-07-23 | Discharge: 2018-07-23 | Disposition: A | Discharge status: Home / Self Care | Payer: Medicare Other | Source: Ambulatory Visit | Attending: Cardiovascular Disease | Admitting: Cardiovascular Disease

## 2018-07-23 DIAGNOSIS — Z951 Presence of aortocoronary bypass graft: Secondary | ICD-10-CM | POA: Diagnosis not present

## 2018-07-23 DIAGNOSIS — Z48812 Encounter for surgical aftercare following surgery on the circulatory system: Secondary | ICD-10-CM | POA: Diagnosis not present

## 2018-07-23 NOTE — Progress Notes (Signed)
Daily Session Note  Patient Details  Name: Albert Peterson MRN: [id number] Date of Birth: Aug 01, 1951 Referring Provider:     CARDIAC REHAB PHASE II ORIENTATION from 06/02/2018 in [place]  Referring Provider  Dr. Bronson Ing      Encounter Date: 07/23/2018  Check In: Session Check In - 07/23/18 0930      Check-In   Supervising physician immediately available to respond to emergencies  See telemetry face sheet for immediately available MD    Location  [facility]    Staff Present  Russella Dar, MS, EP, [facility], Exercise Physiologist;Debra Wynetta Emery, RN, BSN    Medication changes reported      No    Fall or balance concerns reported     No    Tobacco Cessation  No Change    Warm-up and Cool-down  Performed as group-led instruction    Resistance Training Performed  Yes    VAD Patient?  No    PAD/SET Patient?  No      Pain Assessment   Currently in Pain?  No/denies    Pain Score  0-No pain    Multiple Pain Sites  No       Capillary Blood Glucose: No results found for this or any previous visit (from the past 24 hour(s)).    Social History   Tobacco Use  Smoking Status Former Smoker  . Packs/day: 1.50  . Years: 15.00  . Pack years: 22.50  . Types: Cigarettes  . Start date: 12/11/1963  . Last attempt to quit: 12/10/1993  . Years since quitting: 24.6  Smokeless Tobacco Never Used  Tobacco Comment   Quit smoking for 13 years at one point    Goals Met:  Independence with exercise equipment Exercise tolerated well Personal goals reviewed No report of cardiac concerns or symptoms Strength training completed today  Goals Unmet:  Not Applicable  Comments: Pt able to follow exercise prescription today without complaint.  Will continue to monitor for progression. Check out: 1030   Dr. Kate Sable is Medical Director for [facility] Cardiac and Pulmonary Rehab.

## 2018-07-25 ENCOUNTER — Encounter (HOSPITAL_COMMUNITY)
Admission: RE | Admit: 2018-07-25 | Discharge: 2018-07-25 | Disposition: A | Discharge status: Home / Self Care | Payer: Medicare Other | Source: Ambulatory Visit | Attending: Cardiovascular Disease | Admitting: Cardiovascular Disease

## 2018-07-25 DIAGNOSIS — Z48812 Encounter for surgical aftercare following surgery on the circulatory system: Secondary | ICD-10-CM | POA: Diagnosis not present

## 2018-07-25 DIAGNOSIS — Z951 Presence of aortocoronary bypass graft: Secondary | ICD-10-CM

## 2018-07-25 NOTE — Progress Notes (Signed)
Daily Session Note  Patient Details  Name: ALBERTA CAIRNS MRN: [id number] Date of Birth: [birth year] Referring Provider:     CARDIAC REHAB PHASE II ORIENTATION from 06/02/2018 in [place]  Referring Provider  Dr. Bronson Ing      Encounter Date: 07/25/2018  Check In: Session Check In - 07/25/18 0930      Check-In   Supervising physician immediately available to respond to emergencies  See telemetry face sheet for immediately available MD    Location  [facility]    Staff Present  Russella Dar, MS, EP, [facility], Exercise Physiologist;Monya Kozakiewicz Wynetta Emery, RN, BSN    Medication changes reported      No    Fall or balance concerns reported     No    Warm-up and Cool-down  Performed as group-led instruction    Resistance Training Performed  Yes    VAD Patient?  No    PAD/SET Patient?  No      Pain Assessment   Currently in Pain?  No/denies    Pain Score  0-No pain    Multiple Pain Sites  No       Capillary Blood Glucose: No results found for this or any previous visit (from the past 24 hour(s)).    Social History   Tobacco Use  Smoking Status Former Smoker  . Packs/day: 1.50  . Years: 15.00  . Pack years: 22.50  . Types: Cigarettes  . Start date: 12/11/1963  . Last attempt to quit: 12/10/1993  . Years since quitting: 24.6  Smokeless Tobacco Never Used  Tobacco Comment   Quit smoking for 13 years at one point    Goals Met:  Independence with exercise equipment Exercise tolerated well No report of cardiac concerns or symptoms Strength training completed today  Goals Unmet:  Not Applicable  Comments: Check out 1030.   Dr. Kate Sable is Medical Director for [facility] Cardiac and Pulmonary Rehab.

## 2018-07-28 ENCOUNTER — Encounter (HOSPITAL_COMMUNITY)
Admission: RE | Admit: 2018-07-28 | Discharge: 2018-07-28 | Disposition: A | Discharge status: Home / Self Care | Payer: Medicare Other | Source: Ambulatory Visit | Attending: Cardiovascular Disease | Admitting: Cardiovascular Disease

## 2018-07-28 DIAGNOSIS — Z48812 Encounter for surgical aftercare following surgery on the circulatory system: Secondary | ICD-10-CM | POA: Diagnosis not present

## 2018-07-28 DIAGNOSIS — Z951 Presence of aortocoronary bypass graft: Secondary | ICD-10-CM

## 2018-07-28 NOTE — Progress Notes (Signed)
Daily Session Note  Patient Details  Name: Albert Peterson MRN: [id number] Date of Birth: Sep 20, 1951 Referring Provider:     CARDIAC REHAB PHASE II ORIENTATION from 06/02/2018 in [place]  Referring Provider  Dr. Bronson Ing      Encounter Date: 07/28/2018  Check In: Session Check In - 07/28/18 0930      Check-In   Supervising physician immediately available to respond to emergencies  See telemetry face sheet for immediately available MD    Location  [facility]    Staff Present  Russella Dar, MS, EP, [facility], Exercise Physiologist;Debra Wynetta Emery, RN, BSN    Medication changes reported      No    Fall or balance concerns reported     No    Tobacco Cessation  No Change    Warm-up and Cool-down  Performed as group-led instruction    Resistance Training Performed  Yes    VAD Patient?  No    PAD/SET Patient?  No      Pain Assessment   Currently in Pain?  No/denies    Pain Score  0-No pain    Multiple Pain Sites  No       Capillary Blood Glucose: No results found for this or any previous visit (from the past 24 hour(s)).    Social History   Tobacco Use  Smoking Status Former Smoker  . Packs/day: 1.50  . Years: 15.00  . Pack years: 22.50  . Types: Cigarettes  . Start date: 12/11/1963  . Last attempt to quit: 12/10/1993  . Years since quitting: 24.6  Smokeless Tobacco Never Used  Tobacco Comment   Quit smoking for 13 years at one point    Goals Met:  Independence with exercise equipment Exercise tolerated well Personal goals reviewed No report of cardiac concerns or symptoms Strength training completed today  Goals Unmet:  Not Applicable  Comments: Pt able to follow exercise prescription today without complaint.  Will continue to monitor for progression. Check out: 1030   Dr. Kate Sable is Medical Director for [facility] Cardiac and Pulmonary Rehab.

## 2018-07-30 ENCOUNTER — Encounter (HOSPITAL_COMMUNITY)
Admission: RE | Admit: 2018-07-30 | Discharge: 2018-07-30 | Disposition: A | Discharge status: Home / Self Care | Payer: Medicare Other | Source: Ambulatory Visit | Attending: Cardiovascular Disease | Admitting: Cardiovascular Disease

## 2018-07-30 DIAGNOSIS — Z48812 Encounter for surgical aftercare following surgery on the circulatory system: Secondary | ICD-10-CM | POA: Diagnosis not present

## 2018-07-30 DIAGNOSIS — Z951 Presence of aortocoronary bypass graft: Secondary | ICD-10-CM

## 2018-07-30 NOTE — Progress Notes (Signed)
Daily Session Note  Patient Details  Name: Albert Peterson MRN: [id number] Date of Birth: 1951-10-02 Referring Provider:     CARDIAC REHAB PHASE II ORIENTATION from 06/02/2018 in [place]  Referring Provider  Dr. Bronson Ing      Encounter Date: 07/30/2018  Check In: Session Check In - 07/30/18 0930      Check-In   Supervising physician immediately available to respond to emergencies  See telemetry face sheet for immediately available MD    Location  [facility]    Staff Present  Russella Dar, MS, EP, [facility], Exercise Physiologist;Arlind Klingerman Wynetta Emery, RN, BSN    Medication changes reported      No    Fall or balance concerns reported     No    Warm-up and Cool-down  Performed as group-led instruction    Resistance Training Performed  Yes    VAD Patient?  No    PAD/SET Patient?  No      Pain Assessment   Currently in Pain?  No/denies    Pain Score  0-No pain    Multiple Pain Sites  No       Capillary Blood Glucose: No results found for this or any previous visit (from the past 24 hour(s)).    Social History   Tobacco Use  Smoking Status Former Smoker  . Packs/day: 1.50  . Years: 15.00  . Pack years: 22.50  . Types: Cigarettes  . Start date: 12/11/1963  . Last attempt to quit: 12/10/1993  . Years since quitting: 24.6  Smokeless Tobacco Never Used  Tobacco Comment   Quit smoking for 13 years at one point    Goals Met:  Independence with exercise equipment Exercise tolerated well No report of cardiac concerns or symptoms Strength training completed today  Goals Unmet:  Not Applicable  Comments: Pt able to follow exercise prescription today without complaint.  Will continue to monitor for progression. Check out 1030.   Dr. Kate Sable is Medical Director for [facility] Cardiac and Pulmonary Rehab.

## 2018-08-01 ENCOUNTER — Encounter (HOSPITAL_COMMUNITY)
Admission: RE | Admit: 2018-08-01 | Discharge: 2018-08-01 | Disposition: A | Discharge status: Home / Self Care | Payer: Medicare Other | Source: Ambulatory Visit | Attending: Cardiovascular Disease | Admitting: Cardiovascular Disease

## 2018-08-01 DIAGNOSIS — Z48812 Encounter for surgical aftercare following surgery on the circulatory system: Secondary | ICD-10-CM | POA: Diagnosis not present

## 2018-08-01 DIAGNOSIS — Z951 Presence of aortocoronary bypass graft: Secondary | ICD-10-CM | POA: Diagnosis not present

## 2018-08-01 NOTE — Progress Notes (Signed)
Daily Session Note  Patient Details  Name: OMARR HANN MRN: [id number] Date of Birth: August 24, 1951 Referring Provider:     CARDIAC REHAB PHASE II ORIENTATION from 06/02/2018 in [place]  Referring Provider  Dr. Bronson Ing      Encounter Date: 08/01/2018  Check In: Session Check In - 08/01/18 0930      Check-In   Supervising physician immediately available to respond to emergencies  See telemetry face sheet for immediately available MD    Location  [facility]    Staff Present  Russella Dar, MS, EP, [facility], Exercise Physiologist;Debra Wynetta Emery, RN, BSN    Medication changes reported      No    Fall or balance concerns reported     No    Tobacco Cessation  No Change    Warm-up and Cool-down  Performed as group-led instruction    Resistance Training Performed  Yes    VAD Patient?  No    PAD/SET Patient?  No      Pain Assessment   Currently in Pain?  No/denies    Pain Score  0-No pain    Multiple Pain Sites  No       Capillary Blood Glucose: No results found for this or any previous visit (from the past 24 hour(s)).    Social History   Tobacco Use  Smoking Status Former Smoker  . Packs/day: 1.50  . Years: 15.00  . Pack years: 22.50  . Types: Cigarettes  . Start date: 12/11/1963  . Last attempt to quit: 12/10/1993  . Years since quitting: 24.6  Smokeless Tobacco Never Used  Tobacco Comment   Quit smoking for 13 years at one point    Goals Met:  Independence with exercise equipment Exercise tolerated well Personal goals reviewed No report of cardiac concerns or symptoms Strength training completed today  Goals Unmet:  Not Applicable  Comments: Pt able to follow exercise prescription today without complaint.  Will continue to monitor for progression. Check out: 1030   Dr. Kate Sable is Medical Director for [facility] Cardiac and Pulmonary Rehab.

## 2018-08-04 ENCOUNTER — Encounter (HOSPITAL_COMMUNITY)
Admission: RE | Admit: 2018-08-04 | Discharge: 2018-08-04 | Disposition: A | Discharge status: Home / Self Care | Payer: Medicare Other | Source: Ambulatory Visit | Attending: Cardiovascular Disease | Admitting: Cardiovascular Disease

## 2018-08-04 DIAGNOSIS — Z951 Presence of aortocoronary bypass graft: Secondary | ICD-10-CM | POA: Diagnosis not present

## 2018-08-04 DIAGNOSIS — Z48812 Encounter for surgical aftercare following surgery on the circulatory system: Secondary | ICD-10-CM | POA: Diagnosis not present

## 2018-08-04 NOTE — Progress Notes (Signed)
Daily Session Note  Patient Details  Name: Albert Peterson MRN: [id number] Date of Birth: 1951/07/15 Referring Provider:     CARDIAC REHAB PHASE II ORIENTATION from 06/02/2018 in [place]  Referring Provider  Dr. Bronson Ing      Encounter Date: 08/04/2018  Check In: Session Check In - 08/04/18 0930      Check-In   Supervising physician immediately available to respond to emergencies  See telemetry face sheet for immediately available MD    Location  [facility]    Staff Present  Russella Dar, MS, EP, [facility], Exercise Physiologist;Valyn Latchford Wynetta Emery, RN, BSN    Medication changes reported      No    Fall or balance concerns reported     No    Warm-up and Cool-down  Performed as group-led instruction    Resistance Training Performed  Yes    VAD Patient?  No    PAD/SET Patient?  No      Pain Assessment   Currently in Pain?  No/denies    Pain Score  0-No pain    Multiple Pain Sites  No       Capillary Blood Glucose: No results found for this or any previous visit (from the past 24 hour(s)).    Social History   Tobacco Use  Smoking Status Former Smoker  . Packs/day: 1.50  . Years: 15.00  . Pack years: 22.50  . Types: Cigarettes  . Start date: 12/11/1963  . Last attempt to quit: 12/10/1993  . Years since quitting: 24.6  Smokeless Tobacco Never Used  Tobacco Comment   Quit smoking for 13 years at one point    Goals Met:  Independence with exercise equipment Exercise tolerated well No report of cardiac concerns or symptoms Strength training completed today  Goals Unmet:  Not Applicable  Comments: Pt able to follow exercise prescription today without complaint.  Will continue to monitor for progression. Check out 1030.   Dr. Kate Sable is Medical Director for [facility] Cardiac and Pulmonary Rehab.

## 2018-08-06 ENCOUNTER — Encounter (HOSPITAL_COMMUNITY)
Admission: RE | Admit: 2018-08-06 | Discharge: 2018-08-06 | Disposition: A | Discharge status: Home / Self Care | Payer: Medicare Other | Source: Ambulatory Visit | Attending: Cardiovascular Disease | Admitting: Cardiovascular Disease

## 2018-08-06 DIAGNOSIS — Z951 Presence of aortocoronary bypass graft: Secondary | ICD-10-CM | POA: Diagnosis not present

## 2018-08-06 DIAGNOSIS — Z48812 Encounter for surgical aftercare following surgery on the circulatory system: Secondary | ICD-10-CM | POA: Diagnosis not present

## 2018-08-06 NOTE — Progress Notes (Signed)
Cardiac Individual Treatment Plan  Patient Details  Name: LABIB CWYNAR MRN: [id number] Date of Birth: [birth year] Referring Provider:     CARDIAC REHAB PHASE II ORIENTATION from 06/02/2018 in [place]  Referring Provider  Dr. Bronson Ing      Initial Encounter Date:    CARDIAC REHAB PHASE II ORIENTATION from 06/02/2018 in [place]  Date  06/02/18      Visit Diagnosis: S/P CABG x 4  Patient's Home Medications on Admission:  Current Outpatient Medications:  .  aspirin EC 325 MG EC tablet, Take 1 tablet (325 mg total) by mouth daily., Disp: , Rfl:  .  benazepril (LOTENSIN) 5 MG tablet, Take 5 mg by mouth daily., Disp: , Rfl:  .  hydrALAZINE (APRESOLINE) 25 MG tablet, Take 25 mg by mouth 2 (two) times daily., Disp: , Rfl:  .  metoprolol tartrate (LOPRESSOR) 25 MG tablet, Take 0.5 tablets (12.5 mg total) by mouth 2 (two) times daily., Disp: 30 tablet, Rfl: 1 .  rosuvastatin (CRESTOR) 20 MG tablet, Take 1 tablet (20 mg total) by mouth at bedtime., Disp: 30 tablet, Rfl: 1  Past Medical History: Past Medical History:  Diagnosis Date  . Benign essential hypertension   . Diverticulitis   . DM2 (diabetes mellitus, type 2) ([place])   . Eczema   . Hematuria   . Hypercholesteremia   . Hyperlipidemia   . Nephrolithiasis    Per patient "kidney cysts" not stones  . Psoriasis    right leg  . SOB (shortness of breath)     Tobacco Use: Social History   Tobacco Use  Smoking Status Former Smoker  . Packs/day: 1.50  . Years: 15.00  . Pack years: 22.50  . Types: Cigarettes  . Start date: 12/11/1963  . Last attempt to quit: 12/10/1993  . Years since quitting: 24.6  Smokeless Tobacco Never Used  Tobacco Comment   Quit smoking for 13 years at one point    Labs: Recent Review Flowsheet Data    Labs for ITP Cardiac and Pulmonary Rehab Latest Ref Rng & Units 04/16/2018 04/16/2018 04/16/2018 04/16/2018 04/17/2018   Hemoglobin A1c 4.8 - 5.6 % - - - - -    PHART 7.350 - 7.450 7.299(L) 7.378 - 7.381 -   PCO2ART 32.0 - 48.0 mmHg 44.7 45.8 - 44.3 -   HCO3 20.0 - 28.0 mmol/L 21.9 26.8 - 26.1 -   TCO2 22 - 32 mmol/L 23 28 25 27 26    ACIDBASEDEF 0.0 - 2.0 mmol/L 4.0(H) - - - -   O2SAT % 96.0 96.0 - 92.0 -      Capillary Blood Glucose: Lab Results  Component Value Date   GLUCAP 114 (H) 04/22/2018   GLUCAP 110 (H) 04/22/2018   GLUCAP 116 (H) 04/21/2018   GLUCAP 116 (H) 04/21/2018   GLUCAP 115 (H) 04/21/2018     Exercise Target Goals: Exercise Program Goal: Individual exercise prescription set using results from initial 6 min walk test and THRR while considering  patient's activity barriers and safety.   Exercise Prescription Goal: Starting with aerobic activity 30 plus minutes a day, 3 days per week for initial exercise prescription. Provide home exercise prescription and guidelines that participant acknowledges understanding prior to discharge.  Activity Barriers & Risk Stratification: Activity Barriers & Cardiac Risk Stratification - 06/02/18 1442      Activity Barriers & Cardiac Risk Stratification   Activity Barriers  None    Cardiac Risk Stratification  High  6 Minute Walk: 6 Minute Walk    Row Name 06/02/18 1441         6 Minute Walk   Phase  Initial     Distance  1300 feet     Distance % Change  0 %     Distance Feet Change  0 ft     Walk Time  6 minutes     # of Rest Breaks  0     MPH  2.46     METS  2.88     RPE  11     Perceived Dyspnea   11     VO2 Peak  10.49     Symptoms  No     Resting HR  77 bpm     Resting BP  150/70     Resting Oxygen Saturation   96 %     Exercise Oxygen Saturation  during 6 min walk  96 %     Max Ex. HR  101 bpm     Max Ex. BP  160/74     2 Minute Post BP  136/70        Oxygen Initial Assessment:   Oxygen Re-Evaluation:   Oxygen Discharge (Final Oxygen Re-Evaluation):   Initial Exercise Prescription: Initial Exercise Prescription - 06/02/18 1400      Date  of Initial Exercise RX and Referring Provider   Date  06/02/18    Referring Provider  Dr. Bronson Ing      Treadmill   MPH  1.6    Grade  0    Minutes  15    METs  2.2      Recumbant Elliptical   Level  1    RPM  38    Watts  37    Minutes  20    METs  2      Prescription Details   Frequency (times per week)  3    Duration  Progress to 30 minutes of continuous aerobic without signs/symptoms of physical distress      Intensity   THRR 40-80% of Max Heartrate  107-122-137    Ratings of Perceived Exertion  11-13    Perceived Dyspnea  0-4      Progression   Progression  Continue progressive overload as per policy without signs/symptoms or physical distress.      Resistance Training   Training Prescription  Yes    Weight  1    Reps  10-15       Perform Capillary Blood Glucose checks as needed.  Exercise Prescription Changes:  Exercise Prescription Changes    Row Name 06/10/18 0700 06/28/18 1400 07/14/18 0700 08/01/18 1500       Response to Exercise   Blood Pressure (Admit)  140/70  128/66  120/72  112/60    Blood Pressure (Exercise)  154/60  146/68  140/70  110/62    Blood Pressure (Exit)  130/68  116/72  118/66  112/70    Heart Rate (Admit)  84 bpm  71 bpm  65 bpm  75 bpm    Heart Rate (Exercise)  96 bpm  98 bpm  104 bpm  102 bpm    Heart Rate (Exit)  93 bpm  79 bpm  82 bpm  85 bpm    Rating of Perceived Exertion (Exercise)  11  11  12  13     Duration  Progress to 30 minutes of  aerobic without signs/symptoms of physical distress  Progress to  30 minutes of  aerobic without signs/symptoms of physical distress  Progress to 30 minutes of  aerobic without signs/symptoms of physical distress  Progress to 30 minutes of  aerobic without signs/symptoms of physical distress    Intensity  THRR New 119-137-154  THRR New 103-120-136  THRR New 100-117-135  THRR New 106-121-137      Progression   Progression  Continue to progress workloads to maintain intensity without  signs/symptoms of physical distress.  Continue to progress workloads to maintain intensity without signs/symptoms of physical distress.  Continue to progress workloads to maintain intensity without signs/symptoms of physical distress.  Continue to progress workloads to maintain intensity without signs/symptoms of physical distress.    Average METs  -  3.3  3.2  3.86      Resistance Training   Training Prescription  Yes  Yes  Yes  Yes    Weight  1  2  4  5     Reps  10-15  10-15  10-15  10-15    Time  -  5 Minutes  5 Minutes  5 Minutes      Treadmill   MPH  1.6  2.1  2.5  2.9    Grade  0  0  0  0    Minutes  15  15  15  17     METs  2.2  2.6  2.9  3.22      Recumbant Elliptical   Level  1  1  2  2     RPM  48  56  49  58    Watts  59  71  63  85    Minutes  20  20  20  22     METs  2.9  4  3.5  4.5      Home Exercise Plan   Plans to continue exercise at  -  Home (comment)  Home (comment)  Home (comment)    Frequency  -  Add 2 additional days to program exercise sessions.  Add 2 additional days to program exercise sessions.  Add 2 additional days to program exercise sessions.    Initial Home Exercises Provided  -  06/30/18  06/30/18  06/30/18       Exercise Comments:  Exercise Comments    Row Name 06/10/18 0745 07/14/18 0736 08/06/18 1441       Exercise Comments  Patient is doing very well in CR. Patient has just started the program and will be progressed more in time.   Patient has progressed at a steady rate. He has been able to handle the increases in equipment and handheld weights. 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   Patient has progressed at a steady rate. He has been able to handle the increases in equipment and handheld weights. 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         Exercise Goals and Review:  Exercise Goals    Row Name 06/02/18 1443             Exercise Goals   Increase Physical Activity  Yes        Intervention  Provide advice, education, support and counseling about physical activity/exercise needs.;Develop an individualized exercise prescription for aerobic and resistive training based on initial evaluation findings, risk stratification, comorbidities and participant's personal goals.       Expected Outcomes  Short Term: Attend rehab on a regular  basis to increase amount of physical activity.       Increase Strength and Stamina  Yes       Intervention  Provide advice, education, support and counseling about physical activity/exercise needs.;Develop an individualized exercise prescription for aerobic and resistive training based on initial evaluation findings, risk stratification, comorbidities and participant's personal goals.       Expected Outcomes  Short Term: Increase workloads from initial exercise prescription for resistance, speed, and METs.       Able to understand and use rate of perceived exertion (RPE) scale  Yes       Intervention  Provide education and explanation on how to use RPE scale       Expected Outcomes  Short Term: Able to use RPE daily in rehab to express subjective intensity level;Long Term:  Able to use RPE to guide intensity level when exercising independently       Able to understand and use Dyspnea scale  Yes       Intervention  Provide education and explanation on how to use Dyspnea scale       Expected Outcomes  Short Term: Able to use Dyspnea scale daily in rehab to express subjective sense of shortness of breath during exertion;Long Term: Able to use Dyspnea scale to guide intensity level when exercising independently       Knowledge and understanding of Target Heart Rate Range (THRR)  Yes       Intervention  Provide education and explanation of THRR including how the numbers were predicted and where they are located for reference       Expected Outcomes  Short Term: Able to state/look up THRR;Long Term: Able to use THRR to govern intensity when exercising  independently;Short Term: Able to use daily as guideline for intensity in rehab       Able to check pulse independently  Yes       Intervention  Provide education and demonstration on how to check pulse in carotid and radial arteries.;Review the importance of being able to check your own pulse for safety during independent exercise       Expected Outcomes  Short Term: Able to explain why pulse checking is important during independent exercise;Long Term: Able to check pulse independently and accurately       Understanding of Exercise Prescription  Yes       Intervention  Provide education, explanation, and written materials on patient's individual exercise prescription       Expected Outcomes  Short Term: Able to explain program exercise prescription;Long Term: Able to explain home exercise prescription to exercise independently          Exercise Goals Re-Evaluation : Exercise Goals Re-Evaluation    Row Name 06/16/18 0746 07/14/18 0732 08/06/18 1438         Exercise Goal Re-Evaluation   Exercise Goals Review  Increase Physical Activity;Increase Strength and Stamina;Able to understand and use rate of perceived exertion (RPE) scale;Able to check pulse independently;Knowledge and understanding of Target Heart Rate Range (THRR);Improve claudication pain tolerance and improve walking ability;Able to understand and use Dyspnea scale;Understanding of Exercise Prescription  Increase Strength and Stamina  Increase Strength and Stamina;Understanding of Exercise Prescription;Increase Physical Activity     Comments  Patient has stated to me that he is feeling less chest pain since beginning the program, he is breathing better and has much more energy for work.   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   Patient is doing well in the program He is able to handle all thae exercise progressions and weight increases.      Expected Outcomes   Patient wishes to get back to work.   Get back to car shows. Get back to work.   Get back to car shows. Get back to work.          Discharge Exercise Prescription (Final Exercise Prescription Changes): Exercise Prescription Changes - 08/01/18 1500      Response to Exercise   Blood Pressure (Admit)  112/60    Blood Pressure (Exercise)  110/62    Blood Pressure (Exit)  112/70    Heart Rate (Admit)  75 bpm    Heart Rate (Exercise)  102 bpm    Heart Rate (Exit)  85 bpm    Rating of Perceived Exertion (Exercise)  13    Duration  Progress to 30 minutes of  aerobic without signs/symptoms of physical distress    Intensity  THRR New   106-121-137     Progression   Progression  Continue to progress workloads to maintain intensity without signs/symptoms of physical distress.    Average METs  3.86      Resistance Training   Training Prescription  Yes    Weight  5    Reps  10-15    Time  5 Minutes      Treadmill   MPH  2.9    Grade  0    Minutes  17    METs  3.22      Recumbant Elliptical   Level  2    RPM  58    Watts  85    Minutes  22    METs  4.5      Home Exercise Plan   Plans to continue exercise at  Home (comment)    Frequency  Add 2 additional days to program exercise sessions.    Initial Home Exercises Provided  06/30/18       Nutrition:  Target Goals: Understanding of nutrition guidelines, daily intake of sodium 1500mg , cholesterol 200mg , calories 30% from fat and 7% or less from saturated fats, daily to have 5 or more servings of fruits and vegetables.  Biometrics: Pre Biometrics - 06/02/18 1444      Pre Biometrics   Height  5' 8.5" (1.74 m)    Weight  96.5 kg    Waist Circumference  43.5 inches    Hip Circumference  39.5 inches    Waist to Hip Ratio  1.1 %    BMI (Calculated)  31.88    Triceps Skinfold  16 mm    % Body Fat  31 %    Grip Strength  55.33 kg    Flexibility  0 in    Single Leg Stand  60 seconds        Nutrition Therapy Plan and  Nutrition Goals: Nutrition Therapy & Goals - 08/06/18 1458      Nutrition Therapy   RD appointment deferred  Yes      Personal Nutrition Goals   Personal Goal #2  Patient says he continues to eat a heart health diet    Additional Goals?  No       Nutrition Assessments: Nutrition Assessments - 06/02/18 1505      MEDFICTS Scores   Pre Score  15       Nutrition Goals Re-Evaluation:  Nutrition Goals Discharge (Final Nutrition Goals Re-Evaluation):   Psychosocial: Target Goals: Acknowledge presence or absence of significant depression and/or stress, maximize coping skills, provide positive support system. Participant is able to verbalize types and ability to use techniques and skills needed for reducing stress and depression.  Initial Review & Psychosocial Screening: Initial Psych Review & Screening - 06/02/18 1507      Initial Review   Current issues with  None Identified      Family Dynamics   Good Support System?  Yes      Barriers   Psychosocial barriers to participate in program  There are no identifiable barriers or psychosocial needs.      Screening Interventions   Interventions  Encouraged to exercise    Expected Outcomes  Short Term goal: Identification and review with participant of any Quality of Life or Depression concerns found by scoring the questionnaire.;Long Term goal: The participant improves quality of Life and PHQ9 Scores as seen by post scores and/or verbalization of changes       Quality of Life Scores: Quality of Life - 06/02/18 1407      Quality of Life Scores   Health/Function Pre  29.5 %    Socioeconomic Pre  24.64 %    Psych/Spiritual Pre  30 %    Family Pre  30 %    GLOBAL Pre  28.6 %      Scores of 19 and below usually indicate a poorer quality of life in these areas.  A difference of  2-3 points is a clinically meaningful difference.  A difference of 2-3 points in the total score of the Quality of Life Index has been associated with  significant improvement in overall quality of life, self-image, physical symptoms, and general health in studies assessing change in quality of life.  PHQ-9: Recent Review Flowsheet Data    Depression screen [facility] 2/9 06/02/2018   Decreased Interest 0   Down, Depressed, Hopeless 0   PHQ - 2 Score 0   Altered sleeping 0   Tired, decreased energy 0   Change in appetite 0   Feeling bad or failure about yourself  0   Trouble concentrating 0   Moving slowly or fidgety/restless 0   Suicidal thoughts 0   PHQ-9 Score 0     Interpretation of Total Score  Total Score Depression Severity:  1-4 = Minimal depression, 5-9 = Mild depression, 10-14 = Moderate depression, 15-19 = Moderately severe depression, 20-27 = Severe depression   Psychosocial Evaluation and Intervention: Psychosocial Evaluation - 06/02/18 1507      Psychosocial Evaluation & Interventions   Interventions  Encouraged to exercise with the program and follow exercise prescription    Continue Psychosocial Services   No Follow up required       Psychosocial Re-Evaluation: Psychosocial Re-Evaluation    [place] Name 06/18/18 1314 08/06/18 1459           Psychosocial Re-Evaluation   Current issues with  None Identified  None Identified      Comments  Patient's initial QOL score was 23.70 and his PHQ-9 score was 4 with no psychosocial issues identified.   Patient's initial QOL score was 23.70 and his PHQ-9 score was 4 with no psychosocial issues identified.       Expected Outcomes  Patient will have no psychosoical issues identified at discharge.   Patient will have no psychosoical issues identified at discharge.       Interventions  Relaxation education;Encouraged to attend  Cardiac Rehabilitation for the exercise;Stress management education  Relaxation education;Encouraged to attend Cardiac Rehabilitation for the exercise;Stress management education      Continue Psychosocial Services   No Follow up required  No Follow up required          Psychosocial Discharge (Final Psychosocial Re-Evaluation): Psychosocial Re-Evaluation - 08/06/18 1459      Psychosocial Re-Evaluation   Current issues with  None Identified    Comments  Patient's initial QOL score was 23.70 and his PHQ-9 score was 4 with no psychosocial issues identified.     Expected Outcomes  Patient will have no psychosoical issues identified at discharge.     Interventions  Relaxation education;Encouraged to attend Cardiac Rehabilitation for the exercise;Stress management education    Continue Psychosocial Services   No Follow up required       Vocational Rehabilitation: Provide vocational rehab assistance to qualifying candidates.   Vocational Rehab Evaluation & Intervention: Vocational Rehab - 06/02/18 1504      Initial Vocational Rehab Evaluation & Intervention   Assessment shows need for Vocational Rehabilitation  No       Education: Education Goals: Education classes will be provided on a weekly basis, covering required topics. Participant will state understanding/return demonstration of topics presented.  Learning Barriers/Preferences: Learning Barriers/Preferences - 06/02/18 1502      Learning Barriers/Preferences   Learning Barriers  None    Learning Preferences  Skilled Demonstration;Individual Instruction;Group Instruction       Education Topics: Hypertension, Hypertension Reduction -Define heart disease and high blood pressure. Discus how high blood pressure affects the body and ways to reduce high blood pressure.   CARDIAC REHAB PHASE II EXERCISE from 07/30/2018 in [place]  Date  07/16/18  Educator  DC  Instruction Review Code  2- Demonstrated Understanding      Exercise and Your Heart -Discuss why it is important to exercise, the FITT principles of exercise, normal and abnormal responses to exercise, and how to exercise safely.   CARDIAC REHAB PHASE II EXERCISE from 07/30/2018 in [place]  Date  07/23/18  Educator  D. Coad  Instruction Review Code  2- Demonstrated Understanding      Angina -Discuss definition of angina, causes of angina, treatment of angina, and how to decrease risk of having angina.   CARDIAC REHAB PHASE II EXERCISE from 07/30/2018 in [place]  Date  07/30/18  Educator  D. Coad  Instruction Review Code  2- Demonstrated Understanding      Cardiac Medications -Review what the following cardiac medications are used for, how they affect the body, and side effects that may occur when taking the medications.  Medications include Aspirin, Beta blockers, calcium channel blockers, ACE Inhibitors, angiotensin receptor blockers, diuretics, digoxin, and antihyperlipidemics.   Congestive Heart Failure -Discuss the definition of CHF, how to live with CHF, the signs and symptoms of CHF, and how keep track of weight and sodium intake.   Heart Disease and Intimacy -Discus the effect sexual activity has on the heart, how changes occur during intimacy as we age, and safety during sexual activity.   Smoking Cessation / COPD -Discuss different methods to quit smoking, the health benefits of quitting smoking, and the definition of COPD.   Nutrition I: Fats -Discuss the types of cholesterol, what cholesterol does to the heart, and how cholesterol levels can be controlled.   CARDIAC REHAB PHASE II EXERCISE from 07/30/2018 in [place]  Date  06/11/18  Educator  DJ  Instruction Review Code  2- Demonstrated Understanding      Nutrition II: Labels -Discuss the different components of food labels and how to read food label   [place] from 07/30/2018 in [place]  Date  06/04/18  Educator  Dc  Instruction Review Code  2- Demonstrated Understanding      Heart Parts/Heart Disease and PAD -Discuss the anatomy of the heart, the pathway of blood circulation  through the heart, and these are affected by heart disease.   CARDIAC REHAB PHASE II EXERCISE from 07/30/2018 in [place]  Date  06/18/18  Educator  DC  Instruction Review Code  2- Demonstrated Understanding      Stress I: Signs and Symptoms -Discuss the causes of stress, how stress may lead to anxiety and depression, and ways to limit stress.   CARDIAC REHAB PHASE II EXERCISE from 07/30/2018 in [place]  Date  06/25/18  Educator  D. Coad  Instruction Review Code  2- Demonstrated Understanding      Stress II: Relaxation -Discuss different types of relaxation techniques to limit stress.   CARDIAC REHAB PHASE II EXERCISE from 07/30/2018 in [place]  Date  07/02/18  Educator  DC  Instruction Review Code  2- Demonstrated Understanding      Warning Signs of Stroke / TIA -Discuss definition of a stroke, what the signs and symptoms are of a stroke, and how to identify when someone is having stroke.   CARDIAC REHAB PHASE II EXERCISE from 07/16/2018 in [place]  Date  07/09/18  Educator  DC  Instruction Review Code  2- Demonstrated Understanding      Knowledge Questionnaire Score: Knowledge Questionnaire Score - 06/02/18 1502      Knowledge Questionnaire Score   Pre Score  22/24       Core Components/Risk Factors/Patient Goals at Admission: Personal Goals and Risk Factors at Admission - 06/02/18 1506      Core Components/Risk Factors/Patient Goals on Admission    Weight Management  Weight Maintenance    Personal Goal Other  Yes    Personal Goal  Get strong enough to get back to work    Intervention  Attend CR 3 x week and supplement with home exercise 2 x week    Expected Outcomes  Achieve personal goals.        Core Components/Risk Factors/Patient Goals Review:  Goals and Risk Factor Review    Row Name 06/18/18 1311 07/16/18 1510 08/06/18 1454         Core  Components/Risk Factors/Patient Goals Review   Personal Goals Review  Weight Management/Obesity Get back to work.   Weight Management/Obesity Get back to work.   Weight Management/Obesity Get back to work.      Review  Patient has completed 8 sessions maintaining his weight. He is doing well in the program with progression. He says his stamina has increased.  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 Will continue to monitor for progress.   Patient has completed 20 sessions gaining 3 lbs since last 30 day review. He continues to do well in the program with progression. He says he feels stronger and is able to all he wants to do without difficulty. He feels like he is stronger enough to go back to work. He is waiting on MD to release him. He is pleased  with his progress and feels the program is helping him overall. Will continue to monitor for progress.   Patient has completed 29 sessions gaining 3 lbs since last 30 day review. He continues to do well in the program with progression. He has decided not to return to work. He is retired and was working part-time and says he has been out so long now, he is not returning to work. He continues to be pleased with his progress in the program and is able to do all his ADL's without difficulty. 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 He says he feels his energy and endurance has improved a lot. Will continue to monitor for progress.      Expected Outcomes  Patient will continue to attend sessions and complete the program meeting his personal needs.   Patient will continue to attend sessions and complete the program meeting his personal needs.   Patient will continue to attend sessions and complete the program meeting his personal needs.         Core Components/Risk Factors/Patient Goals at Discharge (Final Review):  Goals and Risk Factor Review - 08/06/18 1454      Core Components/Risk  Factors/Patient Goals Review   Personal Goals Review  Weight Management/Obesity   Get back to work.    Review  Patient has completed 29 sessions gaining 3 lbs since last 30 day review. He continues to do well in the program with progression. He has decided not to return to work. He is retired and was working part-time and says he has been out so long now, he is not returning to work. He continues to be pleased with his progress in the program and is able to do all his ADL's without difficulty. 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 He says he feels his energy and endurance has improved a lot. Will continue to monitor for progress.     Expected Outcomes  Patient will continue to attend sessions and complete the program meeting his personal needs.        ITP Comments:   Comments: ITP REVIEW Patient is doing well in the program. Will continue to monitor for progress.

## 2018-08-06 NOTE — Progress Notes (Addendum)
Daily Session Note  Patient Details  Name: Albert Peterson MRN: [id number] Date of Birth: 08/30/1951 Referring Provider:     CARDIAC REHAB PHASE II ORIENTATION from 06/02/2018 in [place]  Referring Provider  Dr. Bronson Ing      Encounter Date: 08/06/2018  Check In: Session Check In - 08/06/18 0930      Check-In   Supervising physician immediately available to respond to emergencies  See telemetry face sheet for immediately available MD    Location  [facility]    Staff Present  Russella Dar, MS, EP, [facility], Exercise Physiologist;Aroush Chasse Wynetta Emery, RN, BSN    Medication changes reported      No    Fall or balance concerns reported     No    Warm-up and Cool-down  Performed as group-led instruction    Resistance Training Performed  Yes    VAD Patient?  No    PAD/SET Patient?  No      Pain Assessment   Currently in Pain?  No/denies    Pain Score  0-No pain    Multiple Pain Sites  No       Capillary Blood Glucose: No results found for this or any previous visit (from the past 24 hour(s)).    Social History   Tobacco Use  Smoking Status Former Smoker  . Packs/day: 1.50  . Years: 15.00  . Pack years: 22.50  . Types: Cigarettes  . Start date: 12/11/1963  . Last attempt to quit: 12/10/1993  . Years since quitting: 24.6  Smokeless Tobacco Never Used  Tobacco Comment   Quit smoking for 13 years at one point    Goals Met:  Independence with exercise equipment Exercise tolerated well No report of cardiac concerns or symptoms Strength training completed today  Goals Unmet:  Not Applicable  Comments: Pt able to follow exercise prescription today without complaint.  Will continue to monitor for progression. Check out 1030.   Dr. Kate Sable is Medical Director for [facility] Cardiac and Pulmonary Rehab.

## 2018-08-08 ENCOUNTER — Encounter (HOSPITAL_COMMUNITY)
Admission: RE | Admit: 2018-08-08 | Discharge: 2018-08-08 | Disposition: A | Discharge status: Home / Self Care | Payer: Medicare Other | Source: Ambulatory Visit | Attending: Cardiovascular Disease | Admitting: Cardiovascular Disease

## 2018-08-08 DIAGNOSIS — Z48812 Encounter for surgical aftercare following surgery on the circulatory system: Secondary | ICD-10-CM | POA: Diagnosis not present

## 2018-08-08 DIAGNOSIS — Z951 Presence of aortocoronary bypass graft: Secondary | ICD-10-CM | POA: Diagnosis not present

## 2018-08-08 NOTE — Progress Notes (Signed)
Daily Session Note  Patient Details  Name: Albert Peterson MRN: [id number] Date of Birth: 06-28-1951 Referring Provider:     CARDIAC REHAB PHASE II ORIENTATION from 06/02/2018 in [place]  Referring Provider  Dr. Bronson Ing      Encounter Date: 08/08/2018  Check In: Session Check In - 08/08/18 0945      Check-In   Supervising physician immediately available to respond to emergencies  See telemetry face sheet for immediately available ER MD    Location  [facility]    Staff Present  Aundra Dubin, RN, BSN;Other    Medication changes reported      No    Fall or balance concerns reported     No    Tobacco Cessation  No Change    Warm-up and Cool-down  Performed on first and last piece of equipment    Resistance Training Performed  Yes    VAD Patient?  No    PAD/SET Patient?  No      Pain Assessment   Currently in Pain?  No/denies    Pain Score  0-No pain    Multiple Pain Sites  No       Capillary Blood Glucose: No results found for this or any previous visit (from the past 24 hour(s)).    Social History   Tobacco Use  Smoking Status Former Smoker  . Packs/day: 1.50  . Years: 15.00  . Pack years: 22.50  . Types: Cigarettes  . Start date: 12/11/1963  . Last attempt to quit: 12/10/1993  . Years since quitting: 24.6  Smokeless Tobacco Never Used  Tobacco Comment   Quit smoking for 13 years at one point    Goals Met:  Independence with exercise equipment Exercise tolerated well Queuing for purse lip breathing No report of cardiac concerns or symptoms  Goals Unmet:  Not Applicable  Comments: Pt able to follow exercise prescription today without complaint.  Will continue to monitor for progression. Checked out at 10:30am.   Dr. Kate Sable is Medical Director for [facility] and Pulmonary Rehab.

## 2018-08-11 ENCOUNTER — Encounter (HOSPITAL_COMMUNITY): Payer: Medicare Other

## 2018-08-13 ENCOUNTER — Encounter (HOSPITAL_COMMUNITY)
Admission: RE | Admit: 2018-08-13 | Discharge: 2018-08-13 | Disposition: A | Discharge status: Home / Self Care | Payer: Medicare Other | Source: Ambulatory Visit | Attending: Cardiovascular Disease | Admitting: Cardiovascular Disease

## 2018-08-13 DIAGNOSIS — Z48812 Encounter for surgical aftercare following surgery on the circulatory system: Secondary | ICD-10-CM | POA: Diagnosis not present

## 2018-08-13 DIAGNOSIS — Z87891 Personal history of nicotine dependence: Secondary | ICD-10-CM | POA: Insufficient documentation

## 2018-08-13 DIAGNOSIS — Z4889 Encounter for other specified surgical aftercare: Secondary | ICD-10-CM | POA: Insufficient documentation

## 2018-08-13 DIAGNOSIS — Z951 Presence of aortocoronary bypass graft: Secondary | ICD-10-CM

## 2018-08-13 NOTE — Progress Notes (Signed)
Daily Session Note  Patient Details  Name: Albert Peterson MRN: [id number] Date of Birth: 02/25/1951 Referring Provider:     CARDIAC REHAB PHASE II ORIENTATION from 06/02/2018 in [place]  Referring Provider  Dr. Bronson Ing      Encounter Date: 08/13/2018  Check In: Session Check In - 08/13/18 0930      Check-In   Supervising physician immediately available to respond to emergencies  See telemetry face sheet for immediately available MD    Location  [facility]    Staff Present  Aundra Dubin, RN, BSN;Other    Medication changes reported      No    Fall or balance concerns reported     No    Tobacco Cessation  No Change    Warm-up and Cool-down  Performed as group-led instruction    Resistance Training Performed  Yes    VAD Patient?  No    PAD/SET Patient?  No      Pain Assessment   Currently in Pain?  No/denies    Pain Score  0-No pain    Multiple Pain Sites  No       Capillary Blood Glucose: No results found for this or any previous visit (from the past 24 hour(s)).    Social History   Tobacco Use  Smoking Status Former Smoker  . Packs/day: 1.50  . Years: 15.00  . Pack years: 22.50  . Types: Cigarettes  . Start date: 12/11/1963  . Last attempt to quit: 12/10/1993  . Years since quitting: 24.6  Smokeless Tobacco Never Used  Tobacco Comment   Quit smoking for 13 years at one point    Goals Met:  Independence with exercise equipment Exercise tolerated well No report of cardiac concerns or symptoms Strength training completed today  Goals Unmet:  Not Applicable  Comments: Pt able to follow exercise prescription today without complaint.  Will continue to monitor for progression. Checked out at 1030.    Dr. Kate Sable is Medical Director for [facility] Cardiac and Pulmonary Rehab.

## 2018-08-15 ENCOUNTER — Encounter (HOSPITAL_COMMUNITY): Payer: Medicare Other

## 2018-08-18 ENCOUNTER — Ambulatory Visit (INDEPENDENT_AMBULATORY_CARE_PROVIDER_SITE_OTHER): Payer: Medicare Other | Admitting: Cardiovascular Disease

## 2018-08-18 ENCOUNTER — Encounter: Payer: Self-pay | Admitting: Cardiovascular Disease

## 2018-08-18 ENCOUNTER — Encounter (HOSPITAL_COMMUNITY): Payer: Medicare Other

## 2018-08-18 VITALS — BP 122/68 | HR 66 | Ht 68.5 in | Wt 212.0 lb

## 2018-08-18 DIAGNOSIS — I25708 Atherosclerosis of coronary artery bypass graft(s), unspecified, with other forms of angina pectoris: Secondary | ICD-10-CM | POA: Diagnosis not present

## 2018-08-18 DIAGNOSIS — I10 Essential (primary) hypertension: Secondary | ICD-10-CM

## 2018-08-18 DIAGNOSIS — I208 Other forms of angina pectoris: Secondary | ICD-10-CM

## 2018-08-18 NOTE — Patient Instructions (Signed)

## 2018-08-18 NOTE — Progress Notes (Signed)
SUBJECTIVE: The patient presents for routine follow-up.  He underwent four-vessel CABG on 04/16/2018 with a LIMA to the LAD, SVG to the diagonal, SVG to the obtuse marginal, and SVG to the PDA.  He has been feeling much better and said "I feel like I am 67 years old again".  He has been doing a lot of walking most recently at some car shows and antique shows and denies exertional chest pain and dyspnea.  He has some chest pains if he sneezes too hard.    Soc Hx: His wife works at the [employer]. He is originally from [place], [place].  Review of Systems: As per "subjective", otherwise negative.  Allergies  Allergen Reactions  . Other Nausea And Vomiting    Patient does not tolerate pain medications well - n/v. Prefers Tylenol   . Chlorhexidine Gluconate Rash    Reaction to CHG Wipes    Current Outpatient Medications  Medication Sig Dispense Refill  . aspirin EC 325 MG EC tablet Take 1 tablet (325 mg total) by mouth daily.    . benazepril (LOTENSIN) 5 MG tablet Take 5 mg by mouth daily.    . hydrALAZINE (APRESOLINE) 25 MG tablet Take 25 mg by mouth 2 (two) times daily.    . metoprolol tartrate (LOPRESSOR) 25 MG tablet Take 0.5 tablets (12.5 mg total) by mouth 2 (two) times daily. 30 tablet 1  . rosuvastatin (CRESTOR) 20 MG tablet Take 1 tablet (20 mg total) by mouth at bedtime. 30 tablet 1   No current facility-administered medications for this visit.     Past Medical History:  Diagnosis Date  . Benign essential hypertension   . Diverticulitis   . DM2 (diabetes mellitus, type 2) ([place])   . Eczema   . Hematuria   . Hypercholesteremia   . Hyperlipidemia   . Nephrolithiasis    Per patient "kidney cysts" not stones  . Psoriasis    right leg  . SOB (shortness of breath)     Past Surgical History:  Procedure Laterality Date  . BIOPSY  07/26/2016   Procedure: BIOPSY;  Surgeon: Daneil Dolin, MD;  [facility];  Service: Endoscopy;;  gastric   .  CATARACT EXTRACTION W/PHACO Right 12/17/2013   Procedure: CATARACT EXTRACTION PHACO AND INTRAOCULAR LENS PLACEMENT (IOC) CDE=13.21;  Surgeon: Tonny Branch, MD;  Location: [facility];  Service: Ophthalmology;  Laterality: Right;  . COLONOSCOPY  03/2018   At [place] 2012 or earlier; records requested  . CORONARY ARTERY BYPASS GRAFT N/A 04/16/2018   Procedure: CORONARY ARTERY BYPASS GRAFTING (CABG) x 4, ON PUMP, LIMA to LAD, SVG to DIAGONAL, SVG to CIRCUMFLEX, and SVG to PDA, USING LEFT INTERNAL MAMMARY ARTERY AND RIGHT GREATER SAPHENOUS VEIN HARVESTED ENDOSCOPICALLY;  Surgeon: Grace Isaac, MD;  Location: [place];  Service: Open Heart Surgery;  Laterality: N/A;  . ESOPHAGOGASTRODUODENOSCOPY N/A 07/26/2016   Procedure: ESOPHAGOGASTRODUODENOSCOPY (EGD);  Surgeon: Daneil Dolin, MD;  Location[facility];  Service: Endoscopy;  Laterality: N/A;  730   . LEFT HEART CATH    . LEFT HEART CATH AND CORONARY ANGIOGRAPHY N/A 04/14/2018   Procedure: LEFT HEART CATH AND CORONARY ANGIOGRAPHY;  Surgeon: Martinique, Peter M, MD;  Location: [place] [facility];  Service: Cardiovascular;  Laterality: N/A;  . NECK SURGERY    . TEE WITHOUT CARDIOVERSION N/A 04/16/2018   Procedure: TRANSESOPHAGEAL ECHOCARDIOGRAM (TEE);  Surgeon: Grace Isaac, MD;  Location: [place];  Service: Open Heart Surgery;  Laterality: N/A;  . TRIGGER FINGER RELEASE      Social History   Socioeconomic History  . Marital status: Married    Spouse name: Not on file  . Number of children: 1  . Years of education: Not on file  . Highest education level: Not on file  Occupational History  . Occupation: shipping and receiving   Social Needs  . Financial resource strain: Not on file  . Food insecurity:    Worry: Not on file    Inability: Not on file  . Transportation needs:    Medical: Not on file    Non-medical: Not on file  Tobacco Use  . Smoking status: Former Smoker    Packs/day: 1.50    Years: 15.00    Pack years: 22.50    Types:  Cigarettes    Start date: 12/11/1963    Last attempt to quit: 12/10/1993    Years since quitting: 24.7  . Smokeless tobacco: Never Used  . Tobacco comment: Quit smoking for 13 years at one point  Substance and Sexual Activity  . Alcohol use: No    Alcohol/week: 0.0 standard drinks    Comment: None currently; previous "youth experience" as a teenager  . Drug use: No  . Sexual activity: Not on file  Lifestyle  . Physical activity:    Days per week: Not on file    Minutes per session: Not on file  . Stress: Not on file  Relationships  . Social connections:    Talks on phone: Not on file    Gets together: Not on file    Attends religious service: Not on file    Active member of club or organization: Not on file    Attends meetings of clubs or organizations: Not on file    Relationship status: Not on file  . Intimate partner violence:    Fear of current or ex partner: Not on file    Emotionally abused: Not on file    Physically abused: Not on file    Forced sexual activity: Not on file  Other Topics Concern  . Not on file  Social History Narrative   Originally from [place]. He moved to [place] in 1973. He has only lived here and [place]. He has traveled to [place], [place], [place], [place], [place], and states along the way to [place]. No international travel. 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 No history of exposure to exotic lumber. Has a dog at home. No bird, mold, or hot tub exposure. 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 No known asbestos exposure.      Vitals:   08/18/18 0847  BP: 122/68  Pulse: 66  SpO2: 97%  Weight: 212 lb (96.2 kg)  Height: 5' 8.5" (1.74 m)    Wt Readings from Last 3 Encounters:  08/18/18 212 lb (96.2 kg)  06/02/18 212 lb 12.8 oz (96.5 kg)  05/19/18 206 lb (93.4 kg)     PHYSICAL EXAM General: NAD HEENT: Normal. Neck: No JVD, no thyromegaly. Lungs: Clear to auscultation bilaterally with normal respiratory  effort. CV: Regular rate and rhythm, normal S1/S2, no S3/S4, no murmur. No pretibial or periankle edema.  No carotid bruit.   Abdomen: Soft, nontender, no distention.  Neurologic: Alert and oriented.  Psych: Normal affect. Skin: Normal. Musculoskeletal: No gross deformities.    ECG: Reviewed above under Subjective   Labs: Lab Results  Component Value Date/Time   K 3.7  04/21/2018 03:37 AM   BUN 21 (H) 04/21/2018 03:37 AM   CREATININE 1.34 (H) 04/21/2018 03:37 AM   ALT 17 04/15/2018 06:10 AM   HGB 10.6 (L) 04/21/2018 03:37 AM     Lipids: No results found for: LDLCALC, LDLDIRECT, CHOL, TRIG, HDL     ASSESSMENT AND PLAN: 1.  Coronary artery disease status post four-vessel CABG: Symptomatically stable.  Continue aspirin, metoprolol, benazepril, and rosuvastatin. I will obtain a copy of lipids from PCP.  2.  Hypertension: Blood pressure is controlled.  No changes to therapy.    Disposition: Follow up 6 months   Kate Sable, M.D., F.A.C.C.

## 2018-08-19 ENCOUNTER — Encounter: Payer: Self-pay | Admitting: *Deleted

## 2018-08-20 ENCOUNTER — Encounter (HOSPITAL_COMMUNITY)
Admission: RE | Admit: 2018-08-20 | Discharge: 2018-08-20 | Disposition: A | Discharge status: Home / Self Care | Payer: Medicare Other | Source: Ambulatory Visit | Attending: Cardiovascular Disease | Admitting: Cardiovascular Disease

## 2018-08-20 DIAGNOSIS — Z951 Presence of aortocoronary bypass graft: Secondary | ICD-10-CM

## 2018-08-20 DIAGNOSIS — Z48812 Encounter for surgical aftercare following surgery on the circulatory system: Secondary | ICD-10-CM | POA: Diagnosis not present

## 2018-08-20 NOTE — Progress Notes (Signed)
Daily Session Note  Patient Details  Name: Albert Peterson MRN: [id number] Date of Birth: September 03, 1951 Referring Provider:     CARDIAC REHAB PHASE II ORIENTATION from 06/02/2018 in [place]  Referring Provider  Dr. Bronson Ing      Encounter Date: 08/20/2018  Check In: Session Check In - 08/20/18 0815      Check-In   Supervising physician immediately available to respond to emergencies  See telemetry face sheet for immediately available MD    Location  [facility]    Staff Present  Benay Pike, Exercise Physiologist    Medication changes reported      No    Fall or balance concerns reported     No    Tobacco Cessation  No Change    Warm-up and Cool-down  Performed as group-led instruction    Resistance Training Performed  Yes    VAD Patient?  No    PAD/SET Patient?  No      Pain Assessment   Currently in Pain?  No/denies    Pain Score  0-No pain    Multiple Pain Sites  No       Capillary Blood Glucose: No results found for this or any previous visit (from the past 24 hour(s)).    Social History   Tobacco Use  Smoking Status Former Smoker  . Packs/day: 1.50  . Years: 15.00  . Pack years: 22.50  . Types: Cigarettes  . Start date: 12/11/1963  . Last attempt to quit: 12/10/1993  . Years since quitting: 24.7  Smokeless Tobacco Never Used  Tobacco Comment   Quit smoking for 13 years at one point    Goals Met:  Independence with exercise equipment Exercise tolerated well No report of cardiac concerns or symptoms Strength training completed today  Goals Unmet:  Not Applicable  Comments: Pt able to follow exercise prescription today without complaint.  Will continue to monitor for progression. Checked out at 9:15.   Dr. Kate Sable is Medical Director for [facility] Cardiac and Pulmonary Rehab.

## 2018-08-22 ENCOUNTER — Encounter (HOSPITAL_COMMUNITY)
Admission: RE | Admit: 2018-08-22 | Discharge: 2018-08-22 | Disposition: A | Discharge status: Home / Self Care | Payer: Medicare Other | Source: Ambulatory Visit | Attending: Cardiovascular Disease | Admitting: Cardiovascular Disease

## 2018-08-22 DIAGNOSIS — Z48812 Encounter for surgical aftercare following surgery on the circulatory system: Secondary | ICD-10-CM | POA: Diagnosis not present

## 2018-08-22 DIAGNOSIS — Z951 Presence of aortocoronary bypass graft: Secondary | ICD-10-CM

## 2018-08-22 NOTE — Progress Notes (Signed)
Daily Session Note  Patient Details  Name: Albert Peterson MRN: [id number] Date of Birth: [birth year] Referring Provider:     CARDIAC REHAB PHASE II ORIENTATION from 06/02/2018 in [place]  Referring Provider  Dr. Bronson Ing      Encounter Date: 08/22/2018  Check In: Session Check In - 08/22/18 0930      Check-In   Supervising physician immediately available to respond to emergencies  See telemetry face sheet for immediately available MD    Location  [facility]    Staff Present  Benay Pike, Exercise Physiologist;Adeja Sarratt Wynetta Emery, RN, BSN    Medication changes reported      No    Fall or balance concerns reported     No    Warm-up and Cool-down  Performed as group-led instruction    Resistance Training Performed  Yes    VAD Patient?  No    PAD/SET Patient?  No      Pain Assessment   Currently in Pain?  No/denies    Pain Score  0-No pain    Multiple Pain Sites  No       Capillary Blood Glucose: No results found for this or any previous visit (from the past 24 hour(s)).    Social History   Tobacco Use  Smoking Status Former Smoker  . Packs/day: 1.50  . Years: 15.00  . Pack years: 22.50  . Types: Cigarettes  . Start date: 12/11/1963  . Last attempt to quit: 12/10/1993  . Years since quitting: 24.7  Smokeless Tobacco Never Used  Tobacco Comment   Quit smoking for 13 years at one point    Goals Met:  Independence with exercise equipment Exercise tolerated well No report of cardiac concerns or symptoms Strength training completed today  Goals Unmet:  Not Applicable  Comments: Pt able to follow exercise prescription today without complaint.  Will continue to monitor for progression. Check out 1030.    Dr. Kate Sable is Medical Director for [facility] Cardiac and Pulmonary Rehab.

## 2018-08-25 ENCOUNTER — Encounter (HOSPITAL_COMMUNITY)
Admission: RE | Admit: 2018-08-25 | Discharge: 2018-08-25 | Disposition: A | Discharge status: Home / Self Care | Payer: Medicare Other | Source: Ambulatory Visit | Attending: Cardiovascular Disease | Admitting: Cardiovascular Disease

## 2018-08-25 DIAGNOSIS — Z951 Presence of aortocoronary bypass graft: Secondary | ICD-10-CM | POA: Diagnosis not present

## 2018-08-25 DIAGNOSIS — Z48812 Encounter for surgical aftercare following surgery on the circulatory system: Secondary | ICD-10-CM | POA: Diagnosis not present

## 2018-08-25 NOTE — Progress Notes (Signed)
Cardiac Individual Treatment Plan  Patient Details  Name: Albert Peterson MRN: [id number] Date of Birth: [birth year] Referring Provider:     CARDIAC REHAB PHASE II ORIENTATION from 06/02/2018 in [facility]  Referring Provider  Dr. Bronson Ing      Initial Encounter Date:    CARDIAC REHAB PHASE II ORIENTATION from 06/02/2018 in [facility]  Date  06/02/18      Visit Diagnosis: S/P CABG x 4  Patient's Home Medications on Admission:  Current Outpatient Medications:  .  aspirin EC 325 MG EC tablet, Take 1 tablet (325 mg total) by mouth daily., Disp: , Rfl:  .  benazepril (LOTENSIN) 5 MG tablet, Take 5 mg by mouth daily., Disp: , Rfl:  .  hydrALAZINE (APRESOLINE) 25 MG tablet, Take 25 mg by mouth 2 (two) times daily., Disp: , Rfl:  .  metoprolol tartrate (LOPRESSOR) 25 MG tablet, Take 0.5 tablets (12.5 mg total) by mouth 2 (two) times daily., Disp: 30 tablet, Rfl: 1 .  rosuvastatin (CRESTOR) 20 MG tablet, Take 1 tablet (20 mg total) by mouth at bedtime., Disp: 30 tablet, Rfl: 1  Past Medical History: Past Medical History:  Diagnosis Date  . Benign essential hypertension   . Diverticulitis   . DM2 (diabetes mellitus, type 2) ([place])   . Eczema   . Hematuria   . Hypercholesteremia   . Hyperlipidemia   . Nephrolithiasis    Per patient "kidney cysts" not stones  . Psoriasis    right leg  . SOB (shortness of breath)     Tobacco Use: Social History   Tobacco Use  Smoking Status Former Smoker  . Packs/day: 1.50  . Years: 15.00  . Pack years: 22.50  . Types: Cigarettes  . Start date: 12/11/1963  . Last attempt to quit: 12/10/1993  . Years since quitting: 24.7  Smokeless Tobacco Never Used  Tobacco Comment   Quit smoking for 13 years at one point    Labs: Recent Review Flowsheet Data    Labs for ITP Cardiac and Pulmonary Rehab Latest Ref Rng & Units 04/16/2018 04/16/2018 04/16/2018 04/16/2018 04/17/2018   Hemoglobin A1c 4.8 - 5.6 % - - - - -    PHART 7.350 - 7.450 7.299(L) 7.378 - 7.381 -   PCO2ART 32.0 - 48.0 mmHg 44.7 45.8 - 44.3 -   HCO3 20.0 - 28.0 mmol/L 21.9 26.8 - 26.1 -   TCO2 22 - 32 mmol/L '23 28 25 27 26   ' ACIDBASEDEF 0.0 - 2.0 mmol/L 4.0(H) - - - -   O2SAT % 96.0 96.0 - 92.0 -      Capillary Blood Glucose: Lab Results  Component Value Date   GLUCAP 114 (H) 04/22/2018   GLUCAP 110 (H) 04/22/2018   GLUCAP 116 (H) 04/21/2018   GLUCAP 116 (H) 04/21/2018   GLUCAP 115 (H) 04/21/2018     Exercise Target Goals: Exercise Program Goal: Individual exercise prescription set using results from initial 6 min walk test and THRR while considering  patient's activity barriers and safety.   Exercise Prescription Goal: Starting with aerobic activity 30 plus minutes a day, 3 days per week for initial exercise prescription. Provide home exercise prescription and guidelines that participant acknowledges understanding prior to discharge.  Activity Barriers & Risk Stratification: Activity Barriers & Cardiac Risk Stratification - 06/02/18 1442      Activity Barriers & Cardiac Risk Stratification   Activity Barriers  None    Cardiac Risk Stratification  High  6 Minute Walk: 6 Minute Walk    Row Name 06/02/18 1441         6 Minute Walk   Phase  Initial     Distance  1300 feet     Distance % Change  0 %     Distance Feet Change  0 ft     Walk Time  6 minutes     # of Rest Breaks  0     MPH  2.46     METS  2.88     RPE  11     Perceived Dyspnea   11     VO2 Peak  10.49     Symptoms  No     Resting HR  77 bpm     Resting BP  150/70     Resting Oxygen Saturation   96 %     Exercise Oxygen Saturation  during 6 min walk  96 %     Max Ex. HR  101 bpm     Max Ex. BP  160/74     2 Minute Post BP  136/70        Oxygen Initial Assessment:   Oxygen Re-Evaluation:   Oxygen Discharge (Final Oxygen Re-Evaluation):   Initial Exercise Prescription: Initial Exercise Prescription - 06/02/18 1400      Date  of Initial Exercise RX and Referring Provider   Date  06/02/18    Referring Provider  Dr. Bronson Ing      Treadmill   MPH  1.6    Grade  0    Minutes  15    METs  2.2      Recumbant Elliptical   Level  1    RPM  38    Watts  37    Minutes  20    METs  2      Prescription Details   Frequency (times per week)  3    Duration  Progress to 30 minutes of continuous aerobic without signs/symptoms of physical distress      Intensity   THRR 40-80% of Max Heartrate  107-122-137    Ratings of Perceived Exertion  11-13    Perceived Dyspnea  0-4      Progression   Progression  Continue progressive overload as per policy without signs/symptoms or physical distress.      Resistance Training   Training Prescription  Yes    Weight  1    Reps  10-15       Perform Capillary Blood Glucose checks as needed.  Exercise Prescription Changes:  Exercise Prescription Changes    Row Name 06/10/18 0700 06/28/18 1400 07/14/18 0700 08/01/18 1500 08/15/18 1000     Response to Exercise   Blood Pressure (Admit)  140/70  128/66  120/72  112/60  134/60   Blood Pressure (Exercise)  154/60  146/68  140/70  110/62  154/68   Blood Pressure (Exit)  130/68  116/72  118/66  112/70  124/66   Heart Rate (Admit)  84 bpm  71 bpm  65 bpm  75 bpm  64 bpm   Heart Rate (Exercise)  96 bpm  98 bpm  104 bpm  102 bpm  107 bpm   Heart Rate (Exit)  93 bpm  79 bpm  82 bpm  85 bpm  85 bpm   Rating of Perceived Exertion (Exercise)  11  11  12  13  13    Duration  Progress to 30 minutes of  aerobic without signs/symptoms of physical distress  Progress to 30 minutes of  aerobic without signs/symptoms of physical distress  Progress to 30 minutes of  aerobic without signs/symptoms of physical distress  Progress to 30 minutes of  aerobic without signs/symptoms of physical distress  Progress to 30 minutes of  aerobic without signs/symptoms of physical distress   Intensity  THRR New 119-137-154  THRR New 103-120-136  THRR New  100-117-135  THRR New 106-121-137  THRR New [phone]     Progression   Progression  Continue to progress workloads to maintain intensity without signs/symptoms of physical distress.  Continue to progress workloads to maintain intensity without signs/symptoms of physical distress.  Continue to progress workloads to maintain intensity without signs/symptoms of physical distress.  Continue to progress workloads to maintain intensity without signs/symptoms of physical distress.  Continue to progress workloads to maintain intensity without signs/symptoms of physical distress.   Average METs  -  3.3  3.2  3.86  3.8     Resistance Training   Training Prescription  Yes  Yes  Yes  Yes  No   Weight  1  2  4  5  5    Reps  10-15  10-15  10-15  10-15  10-15   Time  -  5 Minutes  5 Minutes  5 Minutes  5 Minutes     Treadmill   MPH  1.6  2.1  2.5  2.9  3.9   Grade  0  0  0  0  -   Minutes  15  15  15  17  17    METs  2.2  2.6  2.9  3.22  3.2     Recumbant Elliptical   Level  1  1  2  2  3    RPM  48  56  49  58  75   Watts  59  71  63  85  61   Minutes  20  20  20  22  22    METs  2.9  4  3.5  4.5  4.4     Home Exercise Plan   Plans to continue exercise at  -  Home (comment)  Home (comment)  Home (comment)  Home (comment)   Frequency  -  Add 2 additional days to program exercise sessions.  Add 2 additional days to program exercise sessions.  Add 2 additional days to program exercise sessions.  Add 2 additional days to program exercise sessions.   Initial Home Exercises Provided  -  06/30/18  06/30/18  06/30/18  06/30/18      Exercise Comments:  Exercise Comments    Row Name 06/10/18 0745 07/14/18 0736 08/06/18 1441 08/25/18 1507     Exercise Comments  Patient is doing very well in CR. Patient has just started the program and will be progressed more in time.   Patient has progressed at a steady rate. He has been able to handle the increases in equipment and handheld weights. 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   Patient has progressed at a steady rate. He has been able to handle the increases in equipment and handheld weights. 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   Patient has done well. 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        Exercise Goals and Review:  Exercise Goals    Row  Name 06/02/18 1443             Exercise Goals   Increase Physical Activity  Yes       Intervention  Provide advice, education, support and counseling about physical activity/exercise needs.;Develop an individualized exercise prescription for aerobic and resistive training based on initial evaluation findings, risk stratification, comorbidities and participant's personal goals.       Expected Outcomes  Short Term: Attend rehab on a regular basis to increase amount of physical activity.       Increase Strength and Stamina  Yes       Intervention  Provide advice, education, support and counseling about physical activity/exercise needs.;Develop an individualized exercise prescription for aerobic and resistive training based on initial evaluation findings, risk stratification, comorbidities and participant's personal goals.       Expected Outcomes  Short Term: Increase workloads from initial exercise prescription for resistance, speed, and METs.       Able to understand and use rate of perceived exertion (RPE) scale  Yes       Intervention  Provide education and explanation on how to use RPE scale       Expected Outcomes  Short Term: Able to use RPE daily in rehab to express subjective intensity level;Long Term:  Able to use RPE to guide intensity level when exercising independently       Able to understand and use Dyspnea scale  Yes       Intervention  Provide education and explanation on how to use Dyspnea scale       Expected Outcomes  Short Term: Able to use Dyspnea scale daily in rehab to express subjective sense of shortness of  breath during exertion;Long Term: Able to use Dyspnea scale to guide intensity level when exercising independently       Knowledge and understanding of Target Heart Rate Range (THRR)  Yes       Intervention  Provide education and explanation of THRR including how the numbers were predicted and where they are located for reference       Expected Outcomes  Short Term: Able to state/look up THRR;Long Term: Able to use THRR to govern intensity when exercising independently;Short Term: Able to use daily as guideline for intensity in rehab       Able to check pulse independently  Yes       Intervention  Provide education and demonstration on how to check pulse in carotid and radial arteries.;Review the importance of being able to check your own pulse for safety during independent exercise       Expected Outcomes  Short Term: Able to explain why pulse checking is important during independent exercise;Long Term: Able to check pulse independently and accurately       Understanding of Exercise Prescription  Yes       Intervention  Provide education, explanation, and written materials on patient's individual exercise prescription       Expected Outcomes  Short Term: Able to explain program exercise prescription;Long Term: Able to explain home exercise prescription to exercise independently          Exercise Goals Re-Evaluation : Exercise Goals Re-Evaluation    Row Name 06/16/18 0746 07/14/18 0732 08/06/18 1438 08/25/18 1453       Exercise Goal Re-Evaluation   Exercise Goals Review  Increase Physical Activity;Increase Strength and Stamina;Able to understand and use rate of perceived exertion (RPE) scale;Able to check pulse independently;Knowledge and understanding of Target Heart Rate Range (THRR);Improve  claudication pain tolerance and improve walking ability;Able to understand and use Dyspnea scale;Understanding of Exercise Prescription  Increase Strength and Stamina  Increase Strength and  Stamina;Understanding of Exercise Prescription;Increase Physical Activity  Increase Strength and Stamina;Understanding of Exercise Prescription;Increase Physical Activity    Comments  Patient has stated to me that he is feeling less chest pain since beginning the program, he is breathing better and has much more energy for work.   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   Patient is doing well in the program He is able to handle all thae exercise progressions and weight increases.   Patient is getting ready to graduate after 2 more visits. He has done very well. He is done really well. He has been released by his cardiologist to relturn to work. He will return the Monday after his graduations from the program.     Expected Outcomes  Patient wishes to get back to work.   Get back to car shows. Get back to work.   Get back to car shows. Get back to work.   Get back to car shows. Get back to work.         Discharge Exercise Prescription (Final Exercise Prescription Changes): Exercise Prescription Changes - 08/15/18 1000      Response to Exercise   Blood Pressure (Admit)  134/60    Blood Pressure (Exercise)  154/68    Blood Pressure (Exit)  124/66    Heart Rate (Admit)  64 bpm    Heart Rate (Exercise)  107 bpm    Heart Rate (Exit)  85 bpm    Rating of Perceived Exertion (Exercise)  13    Duration  Progress to 30 minutes of  aerobic without signs/symptoms of physical distress    Intensity  THRR New   [phone]     Progression   Progression  Continue to progress workloads to maintain intensity without signs/symptoms of physical distress.    Average METs  3.8      Resistance Training   Training Prescription  No    Weight  5    Reps  10-15    Time  5 Minutes      Treadmill   MPH  3.9    Minutes  17    METs  3.2      Recumbant Elliptical   Level  3    RPM  75    Watts  61    Minutes  22    METs  4.4      Home  Exercise Plan   Plans to continue exercise at  Home (comment)    Frequency  Add 2 additional days to program exercise sessions.    Initial Home Exercises Provided  06/30/18       Nutrition:  Target Goals: Understanding of nutrition guidelines, daily intake of sodium 1500mg , cholesterol 200mg , calories 30% from fat and 7% or less from saturated fats, daily to have 5 or more servings of fruits and vegetables.  Biometrics: Pre Biometrics - 06/02/18 1444      Pre Biometrics   Height  5' 8.5" (1.74 m)    Weight  212 lb 12.8 oz (96.5 kg)    Waist Circumference  43.5 inches    Hip Circumference  39.5 inches    Waist to Hip Ratio  1.1 %    BMI (Calculated)  31.88    Triceps  Skinfold  16 mm    % Body Fat  31 %    Grip Strength  55.33 kg    Flexibility  0 in    Single Leg Stand  60 seconds        Nutrition Therapy Plan and Nutrition Goals: Nutrition Therapy & Goals - 08/25/18 1505      Nutrition Therapy   RD appointment deferred  --      Personal Nutrition Goals   Personal Goal #2  --    Additional Goals?  --    Comments  --       Nutrition Assessments: Nutrition Assessments - 06/02/18 1505      MEDFICTS Scores   Pre Score  15       Nutrition Goals Re-Evaluation:   Nutrition Goals Discharge (Final Nutrition Goals Re-Evaluation):   Psychosocial: Target Goals: Acknowledge presence or absence of significant depression and/or stress, maximize coping skills, provide positive support system. Participant is able to verbalize types and ability to use techniques and skills needed for reducing stress and depression.  Initial Review & Psychosocial Screening: Initial Psych Review & Screening - 06/02/18 1507      Initial Review   Current issues with  None Identified      Family Dynamics   Good Support System?  Yes      Barriers   Psychosocial barriers to participate in program  There are no identifiable barriers or psychosocial needs.      Screening Interventions    Interventions  Encouraged to exercise    Expected Outcomes  Short Term goal: Identification and review with participant of any Quality of Life or Depression concerns found by scoring the questionnaire.;Long Term goal: The participant improves quality of Life and PHQ9 Scores as seen by post scores and/or verbalization of changes       Quality of Life Scores: Quality of Life - 06/02/18 1407      Quality of Life Scores   Health/Function Pre  29.5 %    Socioeconomic Pre  24.64 %    Psych/Spiritual Pre  30 %    Family Pre  30 %    GLOBAL Pre  28.6 %      Scores of 19 and below usually indicate a poorer quality of life in these areas.  A difference of  2-3 points is a clinically meaningful difference.  A difference of 2-3 points in the total score of the Quality of Life Index has been associated with significant improvement in overall quality of life, self-image, physical symptoms, and general health in studies assessing change in quality of life.  PHQ-9: Recent Review Flowsheet Data    Depression screen [facility] 2/9 06/02/2018   Decreased Interest 0   Down, Depressed, Hopeless 0   PHQ - 2 Score 0   Altered sleeping 0   Tired, decreased energy 0   Change in appetite 0   Feeling bad or failure about yourself  0   Trouble concentrating 0   Moving slowly or fidgety/restless 0   Suicidal thoughts 0   PHQ-9 Score 0     Interpretation of Total Score  Total Score Depression Severity:  1-4 = Minimal depression, 5-9 = Mild depression, 10-14 = Moderate depression, 15-19 = Moderately severe depression, 20-27 = Severe depression   Psychosocial Evaluation and Intervention: Psychosocial Evaluation - 06/02/18 1507      Psychosocial Evaluation & Interventions   Interventions  Encouraged to exercise with the program and follow exercise prescription  Continue Psychosocial Services   No Follow up required       Psychosocial Re-Evaluation: Psychosocial Re-Evaluation    [place] Name 06/18/18 1314  08/06/18 1459 08/22/18 1458         Psychosocial Re-Evaluation   Current issues with  None Identified  None Identified  None Identified     Comments  Patient's initial QOL score was 23.70 and his PHQ-9 score was 4 with no psychosocial issues identified.   Patient's initial QOL score was 23.70 and his PHQ-9 score was 4 with no psychosocial issues identified.   Patient's initial QOL score was 23.70 and his PHQ-9 score was 4 with no psychosocial issues identified.      Expected Outcomes  Patient will have no psychosoical issues identified at discharge.   Patient will have no psychosoical issues identified at discharge.   Patient will have no psychosoical issues identified at discharge.      Interventions  Relaxation education;Encouraged to attend Cardiac Rehabilitation for the exercise;Stress management education  Relaxation education;Encouraged to attend Cardiac Rehabilitation for the exercise;Stress management education  Relaxation education;Encouraged to attend Cardiac Rehabilitation for the exercise;Stress management education     Continue Psychosocial Services   No Follow up required  No Follow up required  No Follow up required        Psychosocial Discharge (Final Psychosocial Re-Evaluation): Psychosocial Re-Evaluation - 08/22/18 1458      Psychosocial Re-Evaluation   Current issues with  None Identified    Comments  Patient's initial QOL score was 23.70 and his PHQ-9 score was 4 with no psychosocial issues identified.     Expected Outcomes  Patient will have no psychosoical issues identified at discharge.     Interventions  Relaxation education;Encouraged to attend Cardiac Rehabilitation for the exercise;Stress management education    Continue Psychosocial Services   No Follow up required       Vocational Rehabilitation: Provide vocational rehab assistance to qualifying candidates.   Vocational Rehab Evaluation & Intervention: Vocational Rehab - 06/02/18 1504      Initial  Vocational Rehab Evaluation & Intervention   Assessment shows need for Vocational Rehabilitation  No       Education: Education Goals: Education classes will be provided on a weekly basis, covering required topics. Participant will state understanding/return demonstration of topics presented.  Learning Barriers/Preferences: Learning Barriers/Preferences - 06/02/18 1502      Learning Barriers/Preferences   Learning Barriers  None    Learning Preferences  Skilled Demonstration;Individual Instruction;Group Instruction       Education Topics: Hypertension, Hypertension Reduction -Define heart disease and high blood pressure. Discus how high blood pressure affects the body and ways to reduce high blood pressure.   CARDIAC REHAB PHASE II EXERCISE from 08/20/2018 in [place]  Date  07/16/18  Educator  DC  Instruction Review Code  2- Demonstrated Understanding      Exercise and Your Heart -Discuss why it is important to exercise, the FITT principles of exercise, normal and abnormal responses to exercise, and how to exercise safely.   CARDIAC REHAB PHASE II EXERCISE from 08/20/2018 in [place]  Date  07/23/18  Educator  D. Cavin Longman  Instruction Review Code  2- Demonstrated Understanding      Angina -Discuss definition of angina, causes of angina, treatment of angina, and how to decrease risk of having angina.   CARDIAC REHAB PHASE II EXERCISE from 08/20/2018 in [place]  Date  07/30/18  Educator  D.  Andrae Claunch  Instruction Review Code  2- Demonstrated Understanding      Cardiac Medications -Review what the following cardiac medications are used for, how they affect the body, and side effects that may occur when taking the medications.  Medications include Aspirin, Beta blockers, calcium channel blockers, ACE Inhibitors, angiotensin receptor blockers, diuretics, digoxin, and antihyperlipidemics.   CARDIAC REHAB PHASE  II EXERCISE from 08/20/2018 in [place]  Date  08/06/18  Educator  Etheleen Mayhew  Instruction Review Code  2- Demonstrated Understanding      Congestive Heart Failure -Discuss the definition of CHF, how to live with CHF, the signs and symptoms of CHF, and how keep track of weight and sodium intake.   CARDIAC REHAB PHASE II EXERCISE from 08/20/2018 in [place]  Date  08/13/18  Educator  D. Zvi Duplantis  Instruction Review Code  2- Demonstrated Understanding      Heart Disease and Intimacy -Discus the effect sexual activity has on the heart, how changes occur during intimacy as we age, and safety during sexual activity.   CARDIAC REHAB PHASE II EXERCISE from 08/20/2018 in [place]  Date  08/20/18  Educator  Etheleen Mayhew  Instruction Review Code  2- Demonstrated Understanding      Smoking Cessation / COPD -Discuss different methods to quit smoking, the health benefits of quitting smoking, and the definition of COPD.   Nutrition I: Fats -Discuss the types of cholesterol, what cholesterol does to the heart, and how cholesterol levels can be controlled.   CARDIAC REHAB PHASE II EXERCISE from 08/20/2018 in [place]  Date  06/11/18  Educator  DJ  Instruction Review Code  2- Demonstrated Understanding      Nutrition II: Labels -Discuss the different components of food labels and how to read food label   CARDIAC REHAB PHASE II EXERCISE from 08/20/2018 in [place]  Date  06/04/18  Educator  Dc  Instruction Review Code  2- Demonstrated Understanding      Heart Parts/Heart Disease and PAD -Discuss the anatomy of the heart, the pathway of blood circulation through the heart, and these are affected by heart disease.   CARDIAC REHAB PHASE II EXERCISE from 08/20/2018 in [place]  Date  06/18/18  Educator  DC  Instruction Review Code  2- Demonstrated  Understanding      Stress I: Signs and Symptoms -Discuss the causes of stress, how stress may lead to anxiety and depression, and ways to limit stress.   CARDIAC REHAB PHASE II EXERCISE from 08/20/2018 in [place]  Date  06/25/18  Educator  D. Otha Monical  Instruction Review Code  2- Demonstrated Understanding      Stress II: Relaxation -Discuss different types of relaxation techniques to limit stress.   CARDIAC REHAB PHASE II EXERCISE from 08/20/2018 in [place]  Date  07/02/18  Educator  DC  Instruction Review Code  2- Demonstrated Understanding      Warning Signs of Stroke / TIA -Discuss definition of a stroke, what the signs and symptoms are of a stroke, and how to identify when someone is having stroke.   CARDIAC REHAB PHASE II EXERCISE from 07/16/2018 in [place]  Date  07/09/18  Educator  DC  Instruction Review Code  2- Demonstrated Understanding      Knowledge Questionnaire Score: Knowledge Questionnaire Score - 06/02/18 1502      Knowledge Questionnaire Score  Pre Score  22/24       Core Components/Risk Factors/Patient Goals at Admission: Personal Goals and Risk Factors at Admission - 06/02/18 1506      Core Components/Risk Factors/Patient Goals on Admission    Weight Management  Weight Maintenance    Personal Goal Other  Yes    Personal Goal  Get strong enough to get back to work    Intervention  Attend CR 3 x week and supplement with home exercise 2 x week    Expected Outcomes  Achieve personal goals.        Core Components/Risk Factors/Patient Goals Review:  Goals and Risk Factor Review    Row Name 06/18/18 1311 07/16/18 1510 08/06/18 1454 08/22/18 1454       Core Components/Risk Factors/Patient Goals Review   Personal Goals Review  Weight Management/Obesity Get back to work.   Weight Management/Obesity Get back to work.   Weight Management/Obesity Get back to work.   Weight  Management/Obesity Get back to work.     Review  Patient has completed 8 sessions maintaining his weight. He is doing well in the program with progression. He says his stamina has increased.  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 Will continue to monitor for progress.   Patient has completed 20 sessions gaining 3 lbs since last 30 day review. He continues to do well in the program with progression. He says he feels stronger and is able to all he wants to do without difficulty. He feels like he is stronger enough to go back to work. He is waiting on MD to release him. He is pleased with his progress and feels the program is helping him overall. Will continue to monitor for progress.   Patient has completed 29 sessions gaining 3 lbs since last 30 day review. He continues to do well in the program with progression. He has decided not to return to work. He is retired and was working part-time and says he has been out so long now, he is not returning to work. He continues to be pleased with his progress in the program and is able to do all his ADL's without difficulty. 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 He says he feels his energy and endurance has improved a lot. Will continue to monitor for progress.   Patient has completed 33 sessions gaining 5 lbs overall since he started the program. He continues to do well in the program with progression. He is not returning to work but is retiring from his part-time job. He continues to say he is getting stronger with increased energy and stamina. He continues to be able to do all is ADL's without difficulty. He is pleased with his progress in the program. 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 Will continue to monitor for progress.     Expected Outcomes  Patient will continue to attend sessions and complete the program meeting his personal needs.   Patient will continue to attend  sessions and complete the program meeting his personal needs.   Patient will continue to attend sessions and complete the program meeting his personal needs.   Patient will continue to attend sessions and complete the program meeting his personal needs.        Core Components/Risk Factors/Patient Goals at Discharge (Final Review):  Goals and Risk Factor Review - 08/22/18 1454  Core Components/Risk Factors/Patient Goals Review   Personal Goals Review  Weight Management/Obesity   Get back to work.    Review  Patient has completed 33 sessions gaining 5 lbs overall since he started the program. He continues to do well in the program with progression. He is not returning to work but is retiring from his part-time job. He continues to say he is getting stronger with increased energy and stamina. He continues to be able to do all is ADL's without difficulty. He is pleased with his progress in the program. 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 Will continue to monitor for progress.     Expected Outcomes  Patient will continue to attend sessions and complete the program meeting his personal needs.        ITP Comments:   Comments:ITP REVIEW Patient has done well in the program. Patient will graduate 08/29/18. Will continue to monitor progress via follow-up calls for 1 year.

## 2018-08-25 NOTE — Progress Notes (Signed)
Daily Session Note  Patient Details  Name: Albert Peterson MRN: [id number] Date of Birth: 1950/12/17 Referring Provider:     CARDIAC REHAB PHASE II ORIENTATION from 06/02/2018 in [place]  Referring Provider  Dr. Bronson Ing      Encounter Date: 08/25/2018  Check In: Session Check In - 08/25/18 0930      Check-In   Supervising physician immediately available to respond to emergencies  See telemetry face sheet for immediately available MD    Location  [facility]    Staff Present  Benay Pike, Exercise Physiologist;Colin Ellers Wynetta Emery, RN, BSN;Diane Coad, MS, EP, [facility], Exercise Physiologist    Medication changes reported      No    Fall or balance concerns reported     No    Warm-up and Cool-down  Performed as group-led instruction    Resistance Training Performed  Yes    VAD Patient?  No    PAD/SET Patient?  No      Pain Assessment   Currently in Pain?  No/denies    Pain Score  0-No pain    Multiple Pain Sites  No       Capillary Blood Glucose: No results found for this or any previous visit (from the past 24 hour(s)).    Social History   Tobacco Use  Smoking Status Former Smoker  . Packs/day: 1.50  . Years: 15.00  . Pack years: 22.50  . Types: Cigarettes  . Start date: 12/11/1963  . Last attempt to quit: 12/10/1993  . Years since quitting: 24.7  Smokeless Tobacco Never Used  Tobacco Comment   Quit smoking for 13 years at one point    Goals Met:  Independence with exercise equipment Exercise tolerated well No report of cardiac concerns or symptoms Strength training completed today  Goals Unmet:  Not Applicable  Comments: Pt able to follow exercise prescription today without complaint.  Will continue to monitor for progression. Check out 1030.   Dr. Kate Sable is Medical Director for [facility].

## 2018-08-27 ENCOUNTER — Encounter (HOSPITAL_COMMUNITY)
Admission: RE | Admit: 2018-08-27 | Discharge: 2018-08-27 | Disposition: A | Discharge status: Home / Self Care | Payer: Medicare Other | Source: Ambulatory Visit | Attending: Cardiovascular Disease | Admitting: Cardiovascular Disease

## 2018-08-27 VITALS — Ht 68.5 in | Wt 212.5 lb

## 2018-08-27 DIAGNOSIS — Z951 Presence of aortocoronary bypass graft: Secondary | ICD-10-CM

## 2018-08-27 DIAGNOSIS — Z48812 Encounter for surgical aftercare following surgery on the circulatory system: Secondary | ICD-10-CM | POA: Diagnosis not present

## 2018-08-27 NOTE — Progress Notes (Signed)
Daily Session Note  Patient Details  Name: RACHEL SAMPLES MRN: [id number] Date of Birth: [birth year] Referring Provider:     CARDIAC REHAB PHASE II ORIENTATION from 06/02/2018 in [place]  Referring Provider  Dr. Bronson Ing      Encounter Date: 08/27/2018  Check In: Session Check In - 08/27/18 0930      Check-In   Supervising physician immediately available to respond to emergencies  See telemetry face sheet for immediately available MD    Location  [facility]    Staff Present  Russella Dar, MS, EP, [facility], Exercise Physiologist;Merrianne Mccumbers, Exercise Physiologist;Debra Wynetta Emery, RN, BSN    Medication changes reported      No    Fall or balance concerns reported     No    Tobacco Cessation  No Change    Warm-up and Cool-down  Performed as group-led instruction    Resistance Training Performed  Yes    VAD Patient?  No    PAD/SET Patient?  No      Pain Assessment   Currently in Pain?  No/denies    Pain Score  0-No pain    Multiple Pain Sites  No       Capillary Blood Glucose: No results found for this or any previous visit (from the past 24 hour(s)).    Social History   Tobacco Use  Smoking Status Former Smoker  . Packs/day: 1.50  . Years: 15.00  . Pack years: 22.50  . Types: Cigarettes  . Start date: 12/11/1963  . Last attempt to quit: 12/10/1993  . Years since quitting: 24.7  Smokeless Tobacco Never Used  Tobacco Comment   Quit smoking for 13 years at one point    Goals Met:  Independence with exercise equipment Exercise tolerated well No report of cardiac concerns or symptoms Strength training completed today  Goals Unmet:  Not Applicable  Comments: Pt able to follow exercise prescription today without complaint.  Will continue to monitor for progression. Check out 10:30.   Dr. Kate Sable is Medical Director for [facility].

## 2018-08-29 ENCOUNTER — Encounter (HOSPITAL_COMMUNITY)
Admission: RE | Admit: 2018-08-29 | Discharge: 2018-08-29 | Disposition: A | Discharge status: Home / Self Care | Payer: Medicare Other | Source: Ambulatory Visit | Attending: Cardiovascular Disease | Admitting: Cardiovascular Disease

## 2018-08-29 DIAGNOSIS — Z951 Presence of aortocoronary bypass graft: Secondary | ICD-10-CM

## 2018-08-29 DIAGNOSIS — Z48812 Encounter for surgical aftercare following surgery on the circulatory system: Secondary | ICD-10-CM | POA: Diagnosis not present

## 2018-08-29 NOTE — Progress Notes (Signed)
Cardiac Individual Treatment Plan  Patient Details  Name: Albert Peterson MRN: [id number] Date of Birth: [birth year] Referring Provider:     CARDIAC REHAB PHASE II ORIENTATION from 06/02/2018 in [facility]  Referring Provider  Dr. Bronson Ing      Initial Encounter Date:    CARDIAC REHAB PHASE II ORIENTATION from 06/02/2018 in [facility]  Date  06/02/18      Visit Diagnosis: S/P CABG x 4  Patient's Home Medications on Admission:  Current Outpatient Medications:  .  aspirin EC 325 MG EC tablet, Take 1 tablet (325 mg total) by mouth daily., Disp: , Rfl:  .  benazepril (LOTENSIN) 5 MG tablet, Take 5 mg by mouth daily., Disp: , Rfl:  .  hydrALAZINE (APRESOLINE) 25 MG tablet, Take 25 mg by mouth 2 (two) times daily., Disp: , Rfl:  .  metoprolol tartrate (LOPRESSOR) 25 MG tablet, Take 0.5 tablets (12.5 mg total) by mouth 2 (two) times daily., Disp: 30 tablet, Rfl: 1 .  rosuvastatin (CRESTOR) 20 MG tablet, Take 1 tablet (20 mg total) by mouth at bedtime., Disp: 30 tablet, Rfl: 1  Past Medical History: Past Medical History:  Diagnosis Date  . Benign essential hypertension   . Diverticulitis   . DM2 (diabetes mellitus, type 2) ([place])   . Eczema   . Hematuria   . Hypercholesteremia   . Hyperlipidemia   . Nephrolithiasis    Per patient "kidney cysts" not stones  . Psoriasis    right leg  . SOB (shortness of breath)     Tobacco Use: Social History   Tobacco Use  Smoking Status Former Smoker  . Packs/day: 1.50  . Years: 15.00  . Pack years: 22.50  . Types: Cigarettes  . Start date: 12/11/1963  . Last attempt to quit: 12/10/1993  . Years since quitting: 24.7  Smokeless Tobacco Never Used  Tobacco Comment   Quit smoking for 13 years at one point    Labs: Recent Review Flowsheet Data    Labs for ITP Cardiac and Pulmonary Rehab Latest Ref Rng & Units 04/16/2018 04/16/2018 04/16/2018 04/16/2018 04/17/2018   Hemoglobin A1c 4.8 - 5.6 % - - - - -    PHART 7.350 - 7.450 7.299(L) 7.378 - 7.381 -   PCO2ART 32.0 - 48.0 mmHg 44.7 45.8 - 44.3 -   HCO3 20.0 - 28.0 mmol/L 21.9 26.8 - 26.1 -   TCO2 22 - 32 mmol/L '23 28 25 27 26   ' ACIDBASEDEF 0.0 - 2.0 mmol/L 4.0(H) - - - -   O2SAT % 96.0 96.0 - 92.0 -      Capillary Blood Glucose: Lab Results  Component Value Date   GLUCAP 114 (H) 04/22/2018   GLUCAP 110 (H) 04/22/2018   GLUCAP 116 (H) 04/21/2018   GLUCAP 116 (H) 04/21/2018   GLUCAP 115 (H) 04/21/2018     Exercise Target Goals: Exercise Program Goal: Individual exercise prescription set using results from initial 6 min walk test and THRR while considering  patient's activity barriers and safety.   Exercise Prescription Goal: Starting with aerobic activity 30 plus minutes a day, 3 days per week for initial exercise prescription. Provide home exercise prescription and guidelines that participant acknowledges understanding prior to discharge.  Activity Barriers & Risk Stratification: Activity Barriers & Cardiac Risk Stratification - 06/02/18 1442      Activity Barriers & Cardiac Risk Stratification   Activity Barriers  None    Cardiac Risk Stratification  High  6 Minute Walk: 6 Minute Walk    Row Name 06/02/18 1441 08/27/18 1011       6 Minute Walk   Phase  Initial  Discharge    Distance  1300 feet  1400 feet    Distance % Change  0 %  7.6 %    Distance Feet Change  0 ft  100 ft    Walk Time  6 minutes  6 minutes    # of Rest Breaks  0  0    MPH  2.46  2.65    METS  2.88  3.03    RPE  11  10    Perceived Dyspnea   11  6    VO2 Peak  10.49  10.44    Symptoms  No  No    Resting HR  77 bpm  83 bpm    Resting BP  150/70  142/70    Resting Oxygen Saturation   96 %  97 %    Exercise Oxygen Saturation  during 6 min walk  96 %  95 %    Max Ex. HR  101 bpm  92 bpm    Max Ex. BP  160/74  148/66    2 Minute Post BP  136/70  128/70       Oxygen Initial Assessment:   Oxygen Re-Evaluation:   Oxygen Discharge  (Final Oxygen Re-Evaluation):   Initial Exercise Prescription: Initial Exercise Prescription - 06/02/18 1400      Date of Initial Exercise RX and Referring Provider   Date  06/02/18    Referring Provider  Dr. Bronson Ing      Treadmill   MPH  1.6    Grade  0    Minutes  15    METs  2.2      Recumbant Elliptical   Level  1    RPM  38    Watts  37    Minutes  20    METs  2      Prescription Details   Frequency (times per week)  3    Duration  Progress to 30 minutes of continuous aerobic without signs/symptoms of physical distress      Intensity   THRR 40-80% of Max Heartrate  [id number]    Ratings of Perceived Exertion  11-13    Perceived Dyspnea  0-4      Progression   Progression  Continue progressive overload as per policy without signs/symptoms or physical distress.      Resistance Training   Training Prescription  Yes    Weight  1    Reps  10-15       Perform Capillary Blood Glucose checks as needed.  Exercise Prescription Changes:  Exercise Prescription Changes    Row Name 06/10/18 0700 06/28/18 1400 07/14/18 0700 08/01/18 1500 08/15/18 1000     Response to Exercise   Blood Pressure (Admit)  140/70  128/66  120/72  112/60  134/60   Blood Pressure (Exercise)  154/60  146/68  140/70  110/62  154/68   Blood Pressure (Exit)  130/68  116/72  118/66  112/70  124/66   Heart Rate (Admit)  84 bpm  71 bpm  65 bpm  75 bpm  64 bpm   Heart Rate (Exercise)  96 bpm  98 bpm  104 bpm  102 bpm  107 bpm   Heart Rate (Exit)  93 bpm  79 bpm  82 bpm  85 bpm  85  bpm   Rating of Perceived Exertion (Exercise)  '11  11  12  13  13   ' Duration  Progress to 30 minutes of  aerobic without signs/symptoms of physical distress  Progress to 30 minutes of  aerobic without signs/symptoms of physical distress  Progress to 30 minutes of  aerobic without signs/symptoms of physical distress  Progress to 30 minutes of  aerobic without signs/symptoms of physical distress  Progress to 30 minutes of   aerobic without signs/symptoms of physical distress   Intensity  THRR New 119-137-154  THRR New 103-120-136  THRR New 100-117-135  THRR New 106-121-137  THRR New [phone]     Progression   Progression  Continue to progress workloads to maintain intensity without signs/symptoms of physical distress.  Continue to progress workloads to maintain intensity without signs/symptoms of physical distress.  Continue to progress workloads to maintain intensity without signs/symptoms of physical distress.  Continue to progress workloads to maintain intensity without signs/symptoms of physical distress.  Continue to progress workloads to maintain intensity without signs/symptoms of physical distress.   Average METs  -  3.3  3.2  3.86  3.8     Resistance Training   Training Prescription  Yes  Yes  Yes  Yes  No   Weight  '1  2  4  5  5   ' Reps  10-15  10-15  10-15  10-15  10-15   Time  -  5 Minutes  5 Minutes  5 Minutes  5 Minutes     Treadmill   MPH  1.6  2.1  2.5  2.9  3.9   Grade  0  0  0  0  -   Minutes  '15  15  15  17  17   ' METs  2.2  2.6  2.9  3.22  3.2     Recumbant Elliptical   Level  '1  1  2  2  3   ' RPM  48  56  49  58  75   Watts  59  71  63  85  61   Minutes  '20  20  20  22  22   ' METs  2.9  4  3.5  4.5  4.4     Home Exercise Plan   Plans to continue exercise at  -  Home (comment)  Home (comment)  Home (comment)  Home (comment)   Frequency  -  Add 2 additional days to program exercise sessions.  Add 2 additional days to program exercise sessions.  Add 2 additional days to program exercise sessions.  Add 2 additional days to program exercise sessions.   Initial Home Exercises Provided  -  06/30/18  06/30/18  06/30/18  06/30/18      Exercise Comments:  Exercise Comments    Row Name 06/10/18 0745 07/14/18 0736 08/06/18 1441 08/25/18 1507     Exercise Comments  Patient is doing very well in CR. Patient has just started the program and will be progressed more in time.   Patient has  progressed at a steady rate. He has been able to handle the increases in equipment and handheld weights. 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   Patient has progressed at a steady rate. He has been able to handle the increases in equipment and handheld weights. 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   Patient has done well. 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        Exercise Goals and Review:  Exercise Goals    Row Name 06/02/18 1443             Exercise Goals   Increase Physical Activity  Yes       Intervention  Provide advice, education, support and counseling about physical activity/exercise needs.;Develop an individualized exercise prescription for aerobic and resistive training based on initial evaluation findings, risk stratification, comorbidities and participant's personal goals.       Expected Outcomes  Short Term: Attend rehab on a regular basis to increase amount of physical activity.       Increase Strength and Stamina  Yes       Intervention  Provide advice, education, support and counseling about physical activity/exercise needs.;Develop an individualized exercise prescription for aerobic and resistive training based on initial evaluation findings, risk stratification, comorbidities and participant's personal goals.       Expected Outcomes  Short Term: Increase workloads from initial exercise prescription for resistance, speed, and METs.       Able to understand and use rate of perceived exertion (RPE) scale  Yes       Intervention  Provide education and explanation on how to use RPE scale       Expected Outcomes  Short Term: Able to use RPE daily in rehab to express subjective intensity level;Long Term:  Able to use RPE to guide intensity level when exercising independently       Able to understand and use Dyspnea scale  Yes       Intervention  Provide education and explanation on how to use Dyspnea scale        Expected Outcomes  Short Term: Able to use Dyspnea scale daily in rehab to express subjective sense of shortness of breath during exertion;Long Term: Able to use Dyspnea scale to guide intensity level when exercising independently       Knowledge and understanding of Target Heart Rate Range (THRR)  Yes       Intervention  Provide education and explanation of THRR including how the numbers were predicted and where they are located for reference       Expected Outcomes  Short Term: Able to state/look up THRR;Long Term: Able to use THRR to govern intensity when exercising independently;Short Term: Able to use daily as guideline for intensity in rehab       Able to check pulse independently  Yes       Intervention  Provide education and demonstration on how to check pulse in carotid and radial arteries.;Review the importance of being able to check your own pulse for safety during independent exercise       Expected Outcomes  Short Term: Able to explain why pulse checking is important during independent exercise;Long Term: Able to check pulse independently and accurately       Understanding of Exercise Prescription  Yes       Intervention  Provide education, explanation, and written materials on patient's individual exercise prescription       Expected Outcomes  Short Term: Able to explain program exercise prescription;Long Term: Able to explain home exercise prescription to exercise independently          Exercise Goals Re-Evaluation : Exercise Goals Re-Evaluation    Row Name 06/16/18 0746 07/14/18 0732 08/06/18 1438 08/25/18 1453       Exercise Goal Re-Evaluation   Exercise Goals Review  Increase Physical Activity;Increase Strength and Stamina;Able to understand and use rate of perceived exertion (RPE) scale;Able to check pulse independently;Knowledge and understanding of Target Heart Rate Range (THRR);Improve claudication pain tolerance and improve walking ability;Able to understand and use  Dyspnea scale;Understanding of Exercise Prescription  Increase Strength and Stamina  Increase Strength and Stamina;Understanding of Exercise Prescription;Increase Physical Activity  Increase Strength and Stamina;Understanding of Exercise Prescription;Increase Physical Activity    Comments  Patient has stated to me that he is feeling less chest pain since beginning the program, he is breathing better and has much more energy for work.   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   Patient is doing well in the program He is able to handle all thae exercise progressions and weight increases.   Patient is getting ready to graduate after 2 more visits. He has done very well. He is done really well. He has been released by his cardiologist to relturn to work. He will return the Monday after his graduations from the program.     Expected Outcomes  Patient wishes to get back to work.   Get back to car shows. Get back to work.   Get back to car shows. Get back to work.   Get back to car shows. Get back to work.         Discharge Exercise Prescription (Final Exercise Prescription Changes): Exercise Prescription Changes - 08/15/18 1000      Response to Exercise   Blood Pressure (Admit)  134/60    Blood Pressure (Exercise)  154/68    Blood Pressure (Exit)  124/66    Heart Rate (Admit)  64 bpm    Heart Rate (Exercise)  107 bpm    Heart Rate (Exit)  85 bpm    Rating of Perceived Exertion (Exercise)  13    Duration  Progress to 30 minutes of  aerobic without signs/symptoms of physical distress    Intensity  THRR New   [phone]     Progression   Progression  Continue to progress workloads to maintain intensity without signs/symptoms of physical distress.    Average METs  3.8      Resistance Training   Training Prescription  No    Weight  5    Reps  10-15    Time  5 Minutes      Treadmill   MPH  3.9    Minutes  17    METs  3.2       Recumbant Elliptical   Level  3    RPM  75    Watts  61    Minutes  22    METs  4.4      Home Exercise Plan   Plans to continue exercise at  Home (comment)    Frequency  Add 2 additional days to program exercise sessions.    Initial Home Exercises Provided  06/30/18       Nutrition:  Target Goals: Understanding of nutrition guidelines, daily intake of sodium <1535m, cholesterol <203m calories 30% from fat and 7% or less from saturated fats, daily to have 5 or more servings of fruits and vegetables.  Biometrics: Pre Biometrics - 06/02/18 1444      Pre Biometrics   Height  5' 8.5" (1.74 m)    Weight  96.5 kg    Waist Circumference  43.5 inches    Hip Circumference  39.5 inches    Waist to Hip Ratio  1.1 %    BMI (Calculated)  31.88    Triceps Skinfold  16 mm    % Body Fat  31 %    Grip Strength  55.33 kg    Flexibility  0 in    Single Leg Stand  60 seconds      Post Biometrics - 08/27/18 1013       Post  Biometrics   Height  5' 8.5" (1.74 m)    Weight  96.4 kg    Waist Circumference  44 inches    Hip Circumference  39.5 inches    Waist to Hip Ratio  1.11 %    BMI (Calculated)  31.84    Triceps Skinfold  8 mm    % Body Fat  28.4 %    Grip Strength  62.5 kg    Flexibility  0 in    Single Leg Stand  63 seconds       Nutrition Therapy Plan and Nutrition Goals: Nutrition Therapy & Goals - 08/25/18 1505      Nutrition Therapy   RD appointment deferred  --      Personal Nutrition Goals   Personal Goal #2  --    Additional Goals?  --    Comments  --       Nutrition Assessments: Nutrition Assessments - 08/29/18 1434      MEDFICTS Scores   Pre Score  15    Post Score  75    Score Difference  60       Nutrition Goals Re-Evaluation:   Nutrition Goals Discharge (Final Nutrition Goals Re-Evaluation):   Psychosocial: Target Goals: Acknowledge presence or absence of significant depression and/or stress, maximize coping skills, provide positive  support system. Participant is able to verbalize types and ability to use techniques and skills needed for reducing stress and depression.  Initial Review & Psychosocial Screening: Initial Psych Review & Screening - 06/02/18 1507      Initial Review   Current issues with  None Identified      Family Dynamics   Good Support System?  Yes      Barriers   Psychosocial barriers to participate in program  There are no identifiable barriers or psychosocial needs.      Screening Interventions   Interventions  Encouraged to exercise    Expected Outcomes  Short Term goal: Identification and review with participant of any Quality of Life or Depression concerns found by scoring the questionnaire.;Long Term goal: The participant improves quality of Life and PHQ9 Scores as seen by post scores and/or verbalization of changes       Quality of Life Scores: Quality of Life - 08/27/18 1014      Quality of Life   Select  Quality of Life      Quality of Life Scores   Health/Function Pre  29.5 %    Health/Function Post  30 %    Health/Function % Change  1.69 %    Socioeconomic Pre  24.64 %    Socioeconomic Post  30 %    Socioeconomic % Change   21.75 %    Psych/Spiritual Pre  30 %    Psych/Spiritual Post  30 %    Psych/Spiritual % Change  0 %    Family Pre  30 %    Family Post  30 %    Family % Change  0 %    GLOBAL Pre  28.6 %    GLOBAL Post  30 %    GLOBAL % Change  4.9 %      Scores of 19 and below usually indicate a poorer quality of life in these areas.  A difference of  2-3 points is a clinically meaningful difference.  A difference of 2-3 points in the total score of the Quality of Life Index has been associated with significant improvement in overall quality of life, self-image, physical symptoms, and general health in studies assessing change in quality of life.  PHQ-9: Recent Review Flowsheet Data    Depression screen [facility] 2/9 08/29/2018 06/02/2018   Decreased Interest 0 0   Down,  Depressed, Hopeless 0 0   PHQ - 2 Score 0 0   Altered sleeping 0 0   Tired, decreased energy 0 0   Change in appetite 0 0   Feeling bad or failure about yourself  0 0   Trouble concentrating 0 0   Moving slowly or fidgety/restless 0 0   Suicidal thoughts 0 0   PHQ-9 Score 0 0     Interpretation of Total Score  Total Score Depression Severity:  1-4 = Minimal depression, 5-9 = Mild depression, 10-14 = Moderate depression, 15-19 = Moderately severe depression, 20-27 = Severe depression   Psychosocial Evaluation and Intervention: Psychosocial Evaluation - 08/29/18 1438      Discharge Psychosocial Assessment & Intervention   Comments  Patient has no psychosocial issues identified at discharge. His exit QOL score increased by 4.91% at 30 and his PHQ-9 score went from 3 to 0.       Psychosocial Re-Evaluation: Psychosocial Re-Evaluation    [place] Name 06/18/18 1314 08/06/18 1459 08/22/18 1458         Psychosocial Re-Evaluation   Current issues with  None Identified  None Identified  None Identified     Comments  Patient's initial QOL score was 23.70 and his PHQ-9 score was 4 with no psychosocial issues identified.   Patient's initial QOL score was 23.70 and his PHQ-9 score was 4 with no psychosocial issues identified.   Patient's initial QOL score was 23.70 and his PHQ-9 score was 4 with no psychosocial issues identified.      Expected Outcomes  Patient will have no psychosoical issues identified at discharge.   Patient will have no psychosoical issues identified at discharge.   Patient will have no psychosoical issues identified at discharge.      Interventions  Relaxation education;Encouraged to attend Cardiac Rehabilitation for the exercise;Stress management education  Relaxation education;Encouraged to attend Cardiac Rehabilitation for the exercise;Stress management education  Relaxation education;Encouraged to attend Cardiac Rehabilitation for the exercise;Stress management education      Continue Psychosocial Services   No Follow up required  No Follow up required  No Follow up required        Psychosocial Discharge (Final Psychosocial Re-Evaluation): Psychosocial Re-Evaluation - 08/22/18 1458      Psychosocial Re-Evaluation   Current issues with  None Identified    Comments  Patient's initial QOL score was 23.70 and his PHQ-9 score was 4 with no psychosocial issues identified.     Expected Outcomes  Patient will have no psychosoical issues identified at discharge.     Interventions  Relaxation education;Encouraged to attend Cardiac Rehabilitation for the exercise;Stress management education    Continue Psychosocial Services   No Follow up required       Vocational Rehabilitation: Provide vocational rehab assistance to qualifying candidates.   Vocational  Rehab Evaluation & Intervention: Vocational Rehab - 06/02/18 1504      Initial Vocational Rehab Evaluation & Intervention   Assessment shows need for Vocational Rehabilitation  No       Education: Education Goals: Education classes will be provided on a weekly basis, covering required topics. Participant will state understanding/return demonstration of topics presented.  Learning Barriers/Preferences: Learning Barriers/Preferences - 06/02/18 1502      Learning Barriers/Preferences   Learning Barriers  None    Learning Preferences  Skilled Demonstration;Individual Instruction;Group Instruction       Education Topics: Hypertension, Hypertension Reduction -Define heart disease and high blood pressure. Discus how high blood pressure affects the body and ways to reduce high blood pressure.   CARDIAC REHAB PHASE II EXERCISE from 08/27/2018 in [place]  Date  07/16/18  Educator  DC  Instruction Review Code  2- Demonstrated Understanding      Exercise and Your Heart -Discuss why it is important to exercise, the FITT principles of exercise, normal and abnormal responses to exercise,  and how to exercise safely.   CARDIAC REHAB PHASE II EXERCISE from 08/27/2018 in [place]  Date  07/23/18  Educator  D. Coad  Instruction Review Code  2- Demonstrated Understanding      Angina -Discuss definition of angina, causes of angina, treatment of angina, and how to decrease risk of having angina.   CARDIAC REHAB PHASE II EXERCISE from 08/27/2018 in [place]  Date  07/30/18  Educator  D. Coad  Instruction Review Code  2- Demonstrated Understanding      Cardiac Medications -Review what the following cardiac medications are used for, how they affect the body, and side effects that may occur when taking the medications.  Medications include Aspirin, Beta blockers, calcium channel blockers, ACE Inhibitors, angiotensin receptor blockers, diuretics, digoxin, and antihyperlipidemics.   CARDIAC REHAB PHASE II EXERCISE from 08/27/2018 in [place]  Date  08/06/18  Educator  Etheleen Mayhew  Instruction Review Code  2- Demonstrated Understanding      Congestive Heart Failure -Discuss the definition of CHF, how to live with CHF, the signs and symptoms of CHF, and how keep track of weight and sodium intake.   CARDIAC REHAB PHASE II EXERCISE from 08/27/2018 in [place]  Date  08/13/18  Educator  D. Coad  Instruction Review Code  2- Demonstrated Understanding      Heart Disease and Intimacy -Discus the effect sexual activity has on the heart, how changes occur during intimacy as we age, and safety during sexual activity.   CARDIAC REHAB PHASE II EXERCISE from 08/27/2018 in [place]  Date  08/20/18  Educator  Etheleen Mayhew  Instruction Review Code  2- Demonstrated Understanding      Smoking Cessation / COPD -Discuss different methods to quit smoking, the health benefits of quitting smoking, and the definition of COPD.   CARDIAC REHAB PHASE II EXERCISE from 08/27/2018 in  [place]  Date  08/27/18  Educator  DWynetta Emery   Instruction Review Code  2- Demonstrated Understanding      Nutrition I: Fats -Discuss the types of cholesterol, what cholesterol does to the heart, and how cholesterol levels can be controlled.   CARDIAC REHAB PHASE II EXERCISE from 08/27/2018 in [place]  Date  06/11/18  Educator  DJ  Instruction Review Code  2- Demonstrated Understanding      Nutrition II:  Labels -Discuss the different components of food labels and how to read food label   CARDIAC REHAB PHASE II EXERCISE from 08/27/2018 in [place]  Date  06/04/18  Educator  Dc  Instruction Review Code  2- Demonstrated Understanding      Heart Parts/Heart Disease and PAD -Discuss the anatomy of the heart, the pathway of blood circulation through the heart, and these are affected by heart disease.   CARDIAC REHAB PHASE II EXERCISE from 08/27/2018 in [place]  Date  06/18/18  Educator  DC  Instruction Review Code  2- Demonstrated Understanding      Stress I: Signs and Symptoms -Discuss the causes of stress, how stress may lead to anxiety and depression, and ways to limit stress.   CARDIAC REHAB PHASE II EXERCISE from 08/27/2018 in [place]  Date  06/25/18  Educator  D. Coad  Instruction Review Code  2- Demonstrated Understanding      Stress II: Relaxation -Discuss different types of relaxation techniques to limit stress.   CARDIAC REHAB PHASE II EXERCISE from 08/27/2018 in [place]  Date  07/02/18  Educator  DC  Instruction Review Code  2- Demonstrated Understanding      Warning Signs of Stroke / TIA -Discuss definition of a stroke, what the signs and symptoms are of a stroke, and how to identify when someone is having stroke.   CARDIAC REHAB PHASE II EXERCISE from 07/16/2018 in [place]  Date   07/09/18  Educator  DC  Instruction Review Code  2- Demonstrated Understanding      Knowledge Questionnaire Score: Knowledge Questionnaire Score - 08/29/18 1434      Knowledge Questionnaire Score   Pre Score  22/24    Post Score  21/24       Core Components/Risk Factors/Patient Goals at Admission: Personal Goals and Risk Factors at Admission - 06/02/18 1506      Core Components/Risk Factors/Patient Goals on Admission    Weight Management  Weight Maintenance    Personal Goal Other  Yes    Personal Goal  Get strong enough to get back to work    Intervention  Attend CR 3 x week and supplement with home exercise 2 x week    Expected Outcomes  Achieve personal goals.        Core Components/Risk Factors/Patient Goals Review:  Goals and Risk Factor Review    Row Name 06/18/18 1311 07/16/18 1510 08/06/18 1454 08/22/18 1454 08/29/18 1435     Core Components/Risk Factors/Patient Goals Review   Personal Goals Review  Weight Management/Obesity Get back to work.   Weight Management/Obesity Get back to work.   Weight Management/Obesity Get back to work.   Weight Management/Obesity Get back to work.   Weight Management/Obesity Get back to work.   Review  Patient has completed 8 sessions maintaining his weight. He is doing well in the program with progression. He says his stamina has increased.  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 Will continue to monitor for progress.   Patient has completed 20 sessions gaining 3 lbs since last 30 day review. He continues to do well in the program with progression. He says he feels stronger and is able to all he wants to do without difficulty. He feels like he is stronger enough to go back to work. He is waiting on MD to release him. He is  pleased with his progress and feels the program is helping him overall. Will continue to monitor for progress.   Patient has completed 29 sessions gaining 3 lbs since last 30 day  review. He continues to do well in the program with progression. He has decided not to return to work. He is retired and was working part-time and says he has been out so long now, he is not returning to work. He continues to be pleased with his progress in the program and is able to do all his ADL's without difficulty. 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 He says he feels his energy and endurance has improved a lot. Will continue to monitor for progress.   Patient has completed 33 sessions gaining 5 lbs overall since he started the program. He continues to do well in the program with progression. He is not returning to work but is retiring from his part-time job. He continues to say he is getting stronger with increased energy and stamina. He continues to be able to do all is ADL's without difficulty. He is pleased with his progress in the program. 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 Will continue to monitor for progress.   Patient graduated with 36 sessions gaining 5 lbs overall. His medficts score increased. Patient says he is not following a heart healthy diet like he should. He just feels better so he is eating more. He plans to work on this. He did well in the program saying he feels better and stronger and has more stamina to do things. His exit measurements improved in grip strength and balance. His exit walk test improved by 7.6%. He plans to go back to work next week and feels he will not have any problems. He is very pleased with his progress in the program. He says he is walking more now for exercise and plans to continue with walking and being physically active at his job. CR will f/u for one year.    Expected Outcomes  Patient will continue to attend sessions and complete the program meeting his personal needs.   Patient will continue to attend sessions and complete the program meeting his personal needs.   Patient will continue to attend sessions  and complete the program meeting his personal needs.   Patient will continue to attend sessions and complete the program meeting his personal needs.   Patient will continue to exercise at home by walking and continue to meet his personal goals.       Core Components/Risk Factors/Patient Goals at Discharge (Final Review):  Goals and Risk Factor Review - 08/29/18 1435      Core Components/Risk Factors/Patient Goals Review   Personal Goals Review  Weight Management/Obesity   Get back to work.   Review  Patient graduated with 36 sessions gaining 5 lbs overall. His medficts score increased. Patient says he is not following a heart healthy diet like he should. He just feels better so he is eating more. He plans to work on this. He did well in the program saying he feels better and stronger and has more stamina to do things. His exit measurements improved in grip strength and balance. His exit walk test improved by 7.6%. He plans to go back to work next week and feels he will not have any problems. He is very pleased with his progress in the program. He says he is walking more now for exercise  and plans to continue with walking and being physically active at his job. CR will f/u for one year.     Expected Outcomes  Patient will continue to exercise at home by walking and continue to meet his personal goals.        ITP Comments:   Comments: Patient graduated from [facility] today on 08/29/18 after completing 36 sessions. He achieved LTG of 30 minutes of aerobic exercise at Max Met level of 5.2. All patients vitals are WNL. Patient has met with dietician. Discharge instruction has been reviewed in detail and patient stated an understanding of material given. Patient plans to continue exercising by walking. Cardiac Rehab staff will make f/u calls at 1 month, 6 months, and 1 year. Patient had no complaints of any abnormal S/S or pain on their exit visit.

## 2018-08-29 NOTE — Progress Notes (Signed)
Daily Session Note  Patient Details  Name: Albert Peterson MRN: [id number] Date of Birth: 04-08-1951 Referring Provider:     CARDIAC REHAB PHASE II ORIENTATION from 06/02/2018 in [place]  Referring Provider  Dr. Bronson Ing      Encounter Date: 08/29/2018  Check In: Session Check In - 08/29/18 0930      Check-In   Supervising physician immediately available to respond to emergencies  See telemetry face sheet for immediately available MD    Location  [facility]    Staff Present  Russella Dar, MS, EP, [facility], Exercise Physiologist;Sou Nohr, Exercise Physiologist;Debra Wynetta Emery, RN, BSN    Medication changes reported      No    Fall or balance concerns reported     No    Tobacco Cessation  No Change    Warm-up and Cool-down  Performed as group-led instruction    Resistance Training Performed  Yes    VAD Patient?  No    PAD/SET Patient?  No      Pain Assessment   Currently in Pain?  No/denies    Pain Score  0-No pain    Multiple Pain Sites  No       Capillary Blood Glucose: No results found for this or any previous visit (from the past 24 hour(s)).    Social History   Tobacco Use  Smoking Status Former Smoker  . Packs/day: 1.50  . Years: 15.00  . Pack years: 22.50  . Types: Cigarettes  . Start date: 12/11/1963  . Last attempt to quit: 12/10/1993  . Years since quitting: 24.7  Smokeless Tobacco Never Used  Tobacco Comment   Quit smoking for 13 years at one point    Goals Met:  Independence with exercise equipment Exercise tolerated well No report of cardiac concerns or symptoms Strength training completed today  Goals Unmet:  Not Applicable  Comments: Pt able to follow exercise prescription today without complaint.  Will continue to monitor for progression. Check out 1030.    Dr. Kate Sable is Medical Director for [facility] Cardiac and Pulmonary Rehab.

## 2018-08-29 NOTE — Progress Notes (Signed)
Discharge Progress Report  Patient Details  Name: Albert Peterson MRN: [id number] Date of Birth: 1951/11/23 Referring Provider:     CARDIAC REHAB PHASE II ORIENTATION from 06/02/2018 in [place]  Referring Provider  Dr. Bronson Ing       Number of Visits: 36  Reason for Discharge:  Patient reached a stable level of exercise. Patient independent in their exercise. Patient has met program and personal goals.  Smoking History:  Social History   Tobacco Use  Smoking Status Former Smoker  . Packs/day: 1.50  . Years: 15.00  . Pack years: 22.50  . Types: Cigarettes  . Start date: 12/11/1963  . Last attempt to quit: 12/10/1993  . Years since quitting: 24.7  Smokeless Tobacco Never Used  Tobacco Comment   Quit smoking for 13 years at one point    Diagnosis:  S/P CABG x 4  ADL UCSD:   Initial Exercise Prescription: Initial Exercise Prescription - 06/02/18 1400      Date of Initial Exercise RX and Referring Provider   Date  06/02/18    Referring Provider  Dr. Bronson Ing      Treadmill   MPH  1.6    Grade  0    Minutes  15    METs  2.2      Recumbant Elliptical   Level  1    RPM  38    Watts  37    Minutes  20    METs  2      Prescription Details   Frequency (times per week)  3    Duration  Progress to 30 minutes of continuous aerobic without signs/symptoms of physical distress      Intensity   THRR 40-80% of Max Heartrate  [id number]    Ratings of Perceived Exertion  11-13    Perceived Dyspnea  0-4      Progression   Progression  Continue progressive overload as per policy without signs/symptoms or physical distress.      Resistance Training   Training Prescription  Yes    Weight  1    Reps  10-15       Discharge Exercise Prescription (Final Exercise Prescription Changes): Exercise Prescription Changes - 08/15/18 1000      Response to Exercise   Blood Pressure (Admit)  134/60    Blood Pressure (Exercise)  154/68    Blood  Pressure (Exit)  124/66    Heart Rate (Admit)  64 bpm    Heart Rate (Exercise)  107 bpm    Heart Rate (Exit)  85 bpm    Rating of Perceived Exertion (Exercise)  13    Duration  Progress to 30 minutes of  aerobic without signs/symptoms of physical distress    Intensity  THRR New   [phone]     Progression   Progression  Continue to progress workloads to maintain intensity without signs/symptoms of physical distress.    Average METs  3.8      Resistance Training   Training Prescription  No    Weight  5    Reps  10-15    Time  5 Minutes      Treadmill   MPH  3.9    Minutes  17    METs  3.2      Recumbant Elliptical   Level  3    RPM  75    Watts  61    Minutes  22    METs  4.4  Home Exercise Plan   Plans to continue exercise at  Home (comment)    Frequency  Add 2 additional days to program exercise sessions.    Initial Home Exercises Provided  06/30/18       Functional Capacity: 6 Minute Walk    Row Name 06/02/18 1441 08/27/18 1011       6 Minute Walk   Phase  Initial  Discharge    Distance  1300 feet  1400 feet    Distance % Change  0 %  7.6 %    Distance Feet Change  0 ft  100 ft    Walk Time  6 minutes  6 minutes    # of Rest Breaks  0  0    MPH  2.46  2.65    METS  2.88  3.03    RPE  11  10    Perceived Dyspnea   11  6    VO2 Peak  10.49  10.44    Symptoms  No  No    Resting HR  77 bpm  83 bpm    Resting BP  150/70  142/70    Resting Oxygen Saturation   96 %  97 %    Exercise Oxygen Saturation  during 6 min walk  96 %  95 %    Max Ex. HR  101 bpm  92 bpm    Max Ex. BP  160/74  148/66    2 Minute Post BP  136/70  128/70       Psychological, QOL, Others - Outcomes: PHQ 2/9: Depression screen [facility] 2/9 08/29/2018 06/02/2018  Decreased Interest 0 0  Down, Depressed, Hopeless 0 0  PHQ - 2 Score 0 0  Altered sleeping 0 0  Tired, decreased energy 0 0  Change in appetite 0 0  Feeling bad or failure about yourself  0 0  Trouble concentrating 0 0   Moving slowly or fidgety/restless 0 0  Suicidal thoughts 0 0  PHQ-9 Score 0 0    Quality of Life: Quality of Life - 08/27/18 1014      Quality of Life   Select  Quality of Life      Quality of Life Scores   Health/Function Pre  29.5 %    Health/Function Post  30 %    Health/Function % Change  1.69 %    Socioeconomic Pre  24.64 %    Socioeconomic Post  30 %    Socioeconomic % Change   21.75 %    Psych/Spiritual Pre  30 %    Psych/Spiritual Post  30 %    Psych/Spiritual % Change  0 %    Family Pre  30 %    Family Post  30 %    Family % Change  0 %    GLOBAL Pre  28.6 %    GLOBAL Post  30 %    GLOBAL % Change  4.9 %       Personal Goals: Goals established at orientation with interventions provided to work toward goal. Personal Goals and Risk Factors at Admission - 06/02/18 1506      Core Components/Risk Factors/Patient Goals on Admission    Weight Management  Weight Maintenance    Personal Goal Other  Yes    Personal Goal  Get strong enough to get back to work    Intervention  Attend CR 3 x week and supplement with home exercise 2 x week  Expected Outcomes  Achieve personal goals.         Personal Goals Discharge: Goals and Risk Factor Review    Row Name 06/18/18 1311 07/16/18 1510 08/06/18 1454 08/22/18 1454 08/29/18 1435     Core Components/Risk Factors/Patient Goals Review   Personal Goals Review  Weight Management/Obesity Get back to work.   Weight Management/Obesity Get back to work.   Weight Management/Obesity Get back to work.   Weight Management/Obesity Get back to work.   Weight Management/Obesity Get back to work.   Review  Patient has completed 8 sessions maintaining his weight. He is doing well in the program with progression. He says his stamina has increased.  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 Will continue to monitor for progress.   Patient has completed 20 sessions gaining 3 lbs since last 30 day  review. He continues to do well in the program with progression. He says he feels stronger and is able to all he wants to do without difficulty. He feels like he is stronger enough to go back to work. He is waiting on MD to release him. He is pleased with his progress and feels the program is helping him overall. Will continue to monitor for progress.   Patient has completed 29 sessions gaining 3 lbs since last 30 day review. He continues to do well in the program with progression. He has decided not to return to work. He is retired and was working part-time and says he has been out so long now, he is not returning to work. He continues to be pleased with his progress in the program and is able to do all his ADL's without difficulty. 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 He says he feels his energy and endurance has improved a lot. Will continue to monitor for progress.   Patient has completed 33 sessions gaining 5 lbs overall since he started the program. He continues to do well in the program with progression. He is not returning to work but is retiring from his part-time job. He continues to say he is getting stronger with increased energy and stamina. He continues to be able to do all is ADL's without difficulty. He is pleased with his progress in the program. 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 Will continue to monitor for progress.   Patient graduated with 36 sessions gaining 5 lbs overall. His medficts score increased. Patient says he is not following a heart healthy diet like he should. He just feels better so he is eating more. He plans to work on this. He did well in the program saying he feels better and stronger and has more stamina to do things. His exit measurements improved in grip strength and balance. His exit walk test improved by 7.6%. He plans to go back to work next week and feels he will not have any problems. He is very pleased with his  progress in the program. He says he is walking more now for exercise and plans to continue with walking and being physically active at his job. CR will f/u for one year.    Expected Outcomes  Patient will continue to attend sessions and complete the program meeting his personal needs.   Patient will continue to attend sessions and complete the program meeting his personal needs.   Patient will continue to attend sessions and complete  the program meeting his personal needs.   Patient will continue to attend sessions and complete the program meeting his personal needs.   Patient will continue to exercise at home by walking and continue to meet his personal goals.       Exercise Goals and Review: Exercise Goals    Row Name 06/02/18 1443             Exercise Goals   Increase Physical Activity  Yes       Intervention  Provide advice, education, support and counseling about physical activity/exercise needs.;Develop an individualized exercise prescription for aerobic and resistive training based on initial evaluation findings, risk stratification, comorbidities and participant's personal goals.       Expected Outcomes  Short Term: Attend rehab on a regular basis to increase amount of physical activity.       Increase Strength and Stamina  Yes       Intervention  Provide advice, education, support and counseling about physical activity/exercise needs.;Develop an individualized exercise prescription for aerobic and resistive training based on initial evaluation findings, risk stratification, comorbidities and participant's personal goals.       Expected Outcomes  Short Term: Increase workloads from initial exercise prescription for resistance, speed, and METs.       Able to understand and use rate of perceived exertion (RPE) scale  Yes       Intervention  Provide education and explanation on how to use RPE scale       Expected Outcomes  Short Term: Able to use RPE daily in rehab to express subjective  intensity level;Long Term:  Able to use RPE to guide intensity level when exercising independently       Able to understand and use Dyspnea scale  Yes       Intervention  Provide education and explanation on how to use Dyspnea scale       Expected Outcomes  Short Term: Able to use Dyspnea scale daily in rehab to express subjective sense of shortness of breath during exertion;Long Term: Able to use Dyspnea scale to guide intensity level when exercising independently       Knowledge and understanding of Target Heart Rate Range (THRR)  Yes       Intervention  Provide education and explanation of THRR including how the numbers were predicted and where they are located for reference       Expected Outcomes  Short Term: Able to state/look up THRR;Long Term: Able to use THRR to govern intensity when exercising independently;Short Term: Able to use daily as guideline for intensity in rehab       Able to check pulse independently  Yes       Intervention  Provide education and demonstration on how to check pulse in carotid and radial arteries.;Review the importance of being able to check your own pulse for safety during independent exercise       Expected Outcomes  Short Term: Able to explain why pulse checking is important during independent exercise;Long Term: Able to check pulse independently and accurately       Understanding of Exercise Prescription  Yes       Intervention  Provide education, explanation, and written materials on patient's individual exercise prescription       Expected Outcomes  Short Term: Able to explain program exercise prescription;Long Term: Able to explain home exercise prescription to exercise independently          Nutrition & Weight - Outcomes: Pre Biometrics -  06/02/18 1444      Pre Biometrics   Height  5' 8.5" (1.74 m)    Weight  96.5 kg    Waist Circumference  43.5 inches    Hip Circumference  39.5 inches    Waist to Hip Ratio  1.1 %    BMI (Calculated)  31.88     Triceps Skinfold  16 mm    % Body Fat  31 %    Grip Strength  55.33 kg    Flexibility  0 in    Single Leg Stand  60 seconds      Post Biometrics - 08/27/18 1013       Post  Biometrics   Height  5' 8.5" (1.74 m)    Weight  96.4 kg    Waist Circumference  44 inches    Hip Circumference  39.5 inches    Waist to Hip Ratio  1.11 %    BMI (Calculated)  31.84    Triceps Skinfold  8 mm    % Body Fat  28.4 %    Grip Strength  62.5 kg    Flexibility  0 in    Single Leg Stand  63 seconds       Nutrition: Nutrition Therapy & Goals - 08/25/18 1505      Nutrition Therapy   RD appointment deferred  --      Personal Nutrition Goals   Personal Goal #2  --    Additional Goals?  --    Comments  --       Nutrition Discharge: Nutrition Assessments - 08/29/18 1434      MEDFICTS Scores   Pre Score  15    Post Score  75    Score Difference  60       Education Questionnaire Score: Knowledge Questionnaire Score - 08/29/18 1434      Knowledge Questionnaire Score   Pre Score  22/24    Post Score  21/24       Goals reviewed with patient; copy given to patient.

## 2018-09-01 ENCOUNTER — Encounter (HOSPITAL_COMMUNITY): Payer: Medicare Other

## 2018-10-21 DIAGNOSIS — E782 Mixed hyperlipidemia: Secondary | ICD-10-CM | POA: Diagnosis not present

## 2018-10-21 DIAGNOSIS — Z23 Encounter for immunization: Secondary | ICD-10-CM | POA: Diagnosis not present

## 2018-10-21 DIAGNOSIS — Z0000 Encounter for general adult medical examination without abnormal findings: Secondary | ICD-10-CM | POA: Diagnosis not present

## 2018-10-21 DIAGNOSIS — I10 Essential (primary) hypertension: Secondary | ICD-10-CM | POA: Diagnosis not present

## 2018-10-30 DIAGNOSIS — M810 Age-related osteoporosis without current pathological fracture: Secondary | ICD-10-CM | POA: Diagnosis not present

## 2018-10-30 DIAGNOSIS — Z87891 Personal history of nicotine dependence: Secondary | ICD-10-CM | POA: Diagnosis not present

## 2018-10-30 DIAGNOSIS — M8588 Other specified disorders of bone density and structure, other site: Secondary | ICD-10-CM | POA: Diagnosis not present

## 2018-10-30 DIAGNOSIS — M85851 Other specified disorders of bone density and structure, right thigh: Secondary | ICD-10-CM | POA: Diagnosis not present

## 2018-10-30 DIAGNOSIS — Z136 Encounter for screening for cardiovascular disorders: Secondary | ICD-10-CM | POA: Diagnosis not present

## 2019-01-27 DIAGNOSIS — E669 Obesity, unspecified: Secondary | ICD-10-CM | POA: Diagnosis not present

## 2019-01-27 DIAGNOSIS — I10 Essential (primary) hypertension: Secondary | ICD-10-CM | POA: Diagnosis not present

## 2019-01-27 DIAGNOSIS — Z125 Encounter for screening for malignant neoplasm of prostate: Secondary | ICD-10-CM | POA: Diagnosis not present

## 2019-01-27 DIAGNOSIS — E782 Mixed hyperlipidemia: Secondary | ICD-10-CM | POA: Diagnosis not present

## 2019-02-25 ENCOUNTER — Telehealth: Payer: Self-pay | Admitting: Cardiovascular Disease

## 2019-02-25 NOTE — Telephone Encounter (Signed)
I called the patient cell phone number and spoke with his wife.  He is doing well and without complaints and she is in agreement that his appointment can be rescheduled to a later date given the [place] pandemic.

## 2019-03-02 ENCOUNTER — Ambulatory Visit: Payer: Medicare Other | Admitting: Cardiovascular Disease

## 2019-04-17 ENCOUNTER — Telehealth: Payer: Medicare Other | Admitting: Cardiovascular Disease

## 2019-04-30 DIAGNOSIS — E669 Obesity, unspecified: Secondary | ICD-10-CM | POA: Diagnosis not present

## 2019-04-30 DIAGNOSIS — I10 Essential (primary) hypertension: Secondary | ICD-10-CM | POA: Diagnosis not present

## 2019-04-30 DIAGNOSIS — E782 Mixed hyperlipidemia: Secondary | ICD-10-CM | POA: Diagnosis not present

## 2019-04-30 DIAGNOSIS — N182 Chronic kidney disease, stage 2 (mild): Secondary | ICD-10-CM | POA: Diagnosis not present

## 2019-07-10 ENCOUNTER — Other Ambulatory Visit: Payer: Self-pay

## 2019-08-10 DIAGNOSIS — E782 Mixed hyperlipidemia: Secondary | ICD-10-CM | POA: Diagnosis not present

## 2019-08-10 DIAGNOSIS — E669 Obesity, unspecified: Secondary | ICD-10-CM | POA: Diagnosis not present

## 2019-08-10 DIAGNOSIS — N182 Chronic kidney disease, stage 2 (mild): Secondary | ICD-10-CM | POA: Diagnosis not present

## 2019-08-10 DIAGNOSIS — I10 Essential (primary) hypertension: Secondary | ICD-10-CM | POA: Diagnosis not present

## 2019-09-08 ENCOUNTER — Telehealth: Payer: Self-pay | Admitting: Cardiovascular Disease

## 2019-09-08 NOTE — Telephone Encounter (Signed)
FYI.  °Contacted patient regarding recall appointment, patient notified our office they did not wish to keep this appointment at this time.  Deleted recall from system. °

## 2019-10-06 DIAGNOSIS — Z23 Encounter for immunization: Secondary | ICD-10-CM | POA: Diagnosis not present

## 2019-12-12 IMAGING — DX DG CHEST 2V
2 series · 2 of 2 positions shown · non-contrast
Comparison: 02/04/2018

CLINICAL DATA: Chest pain

EXAM:
CHEST - 2 VIEW

[chest pa]
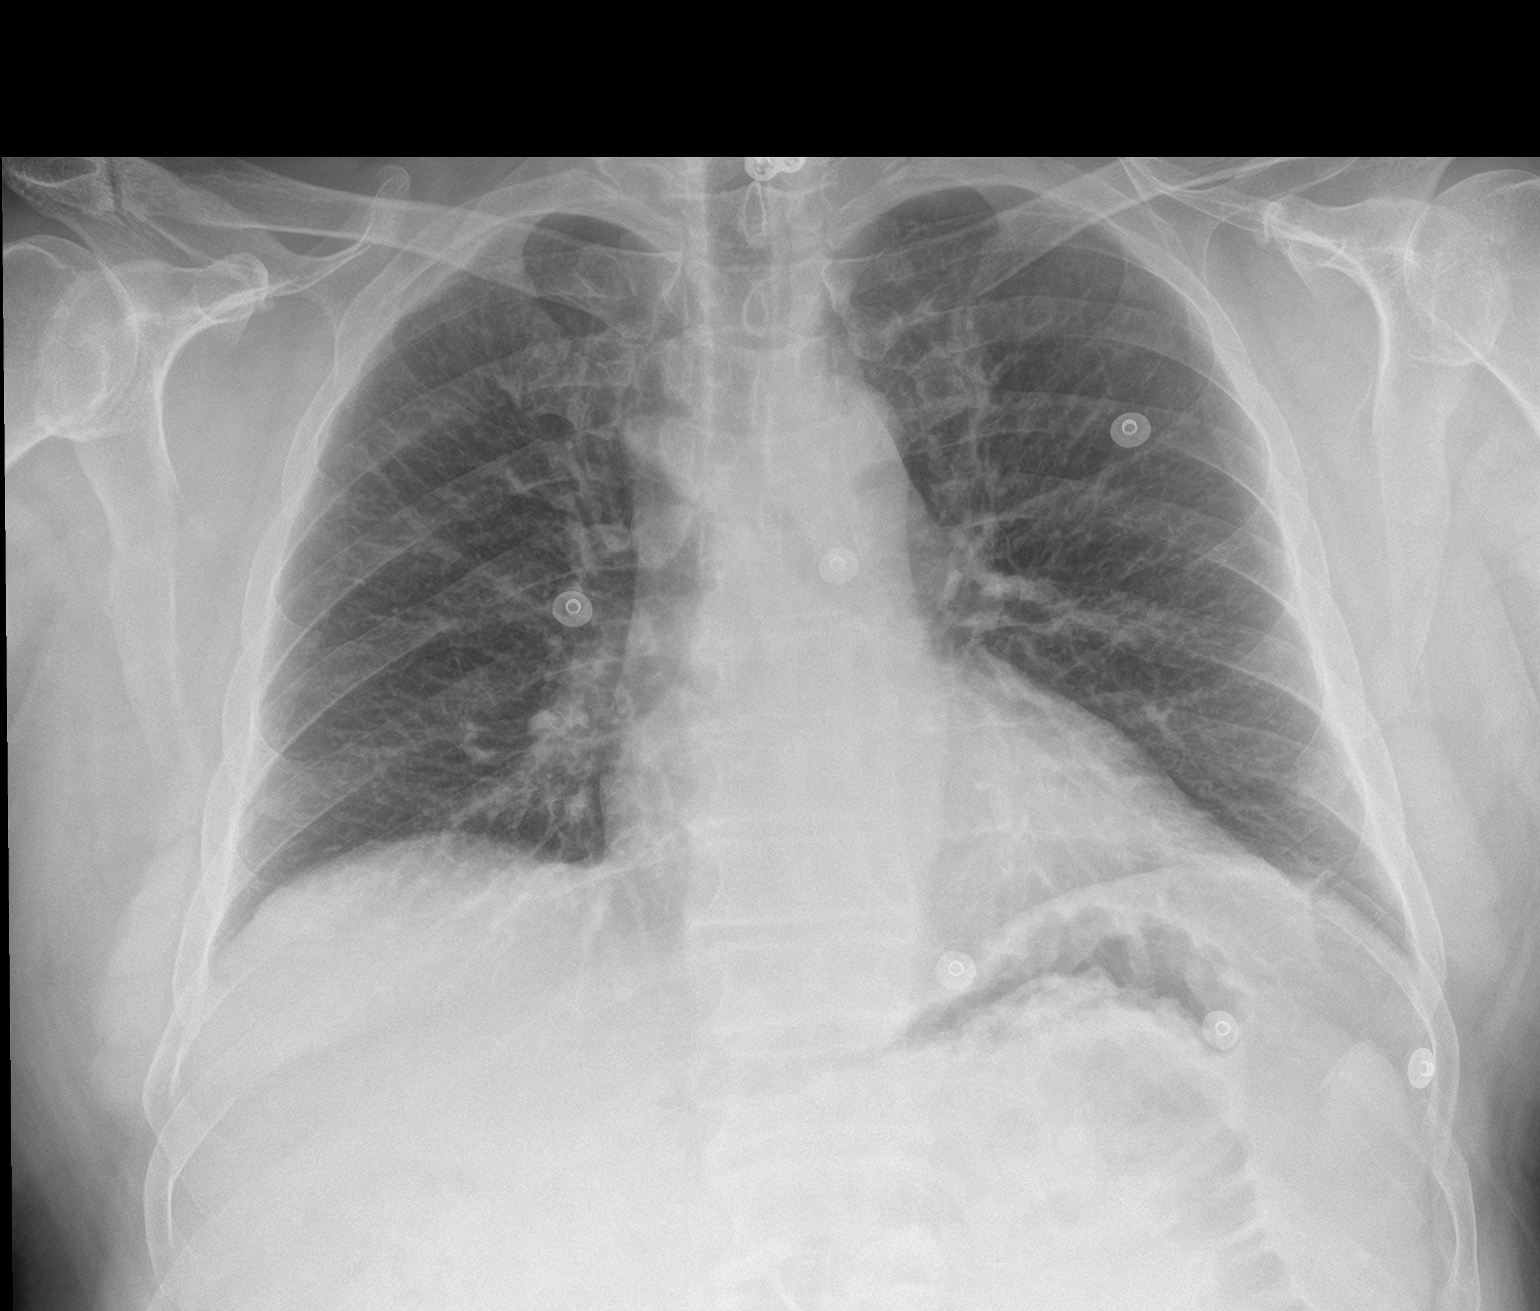

[chest lat]
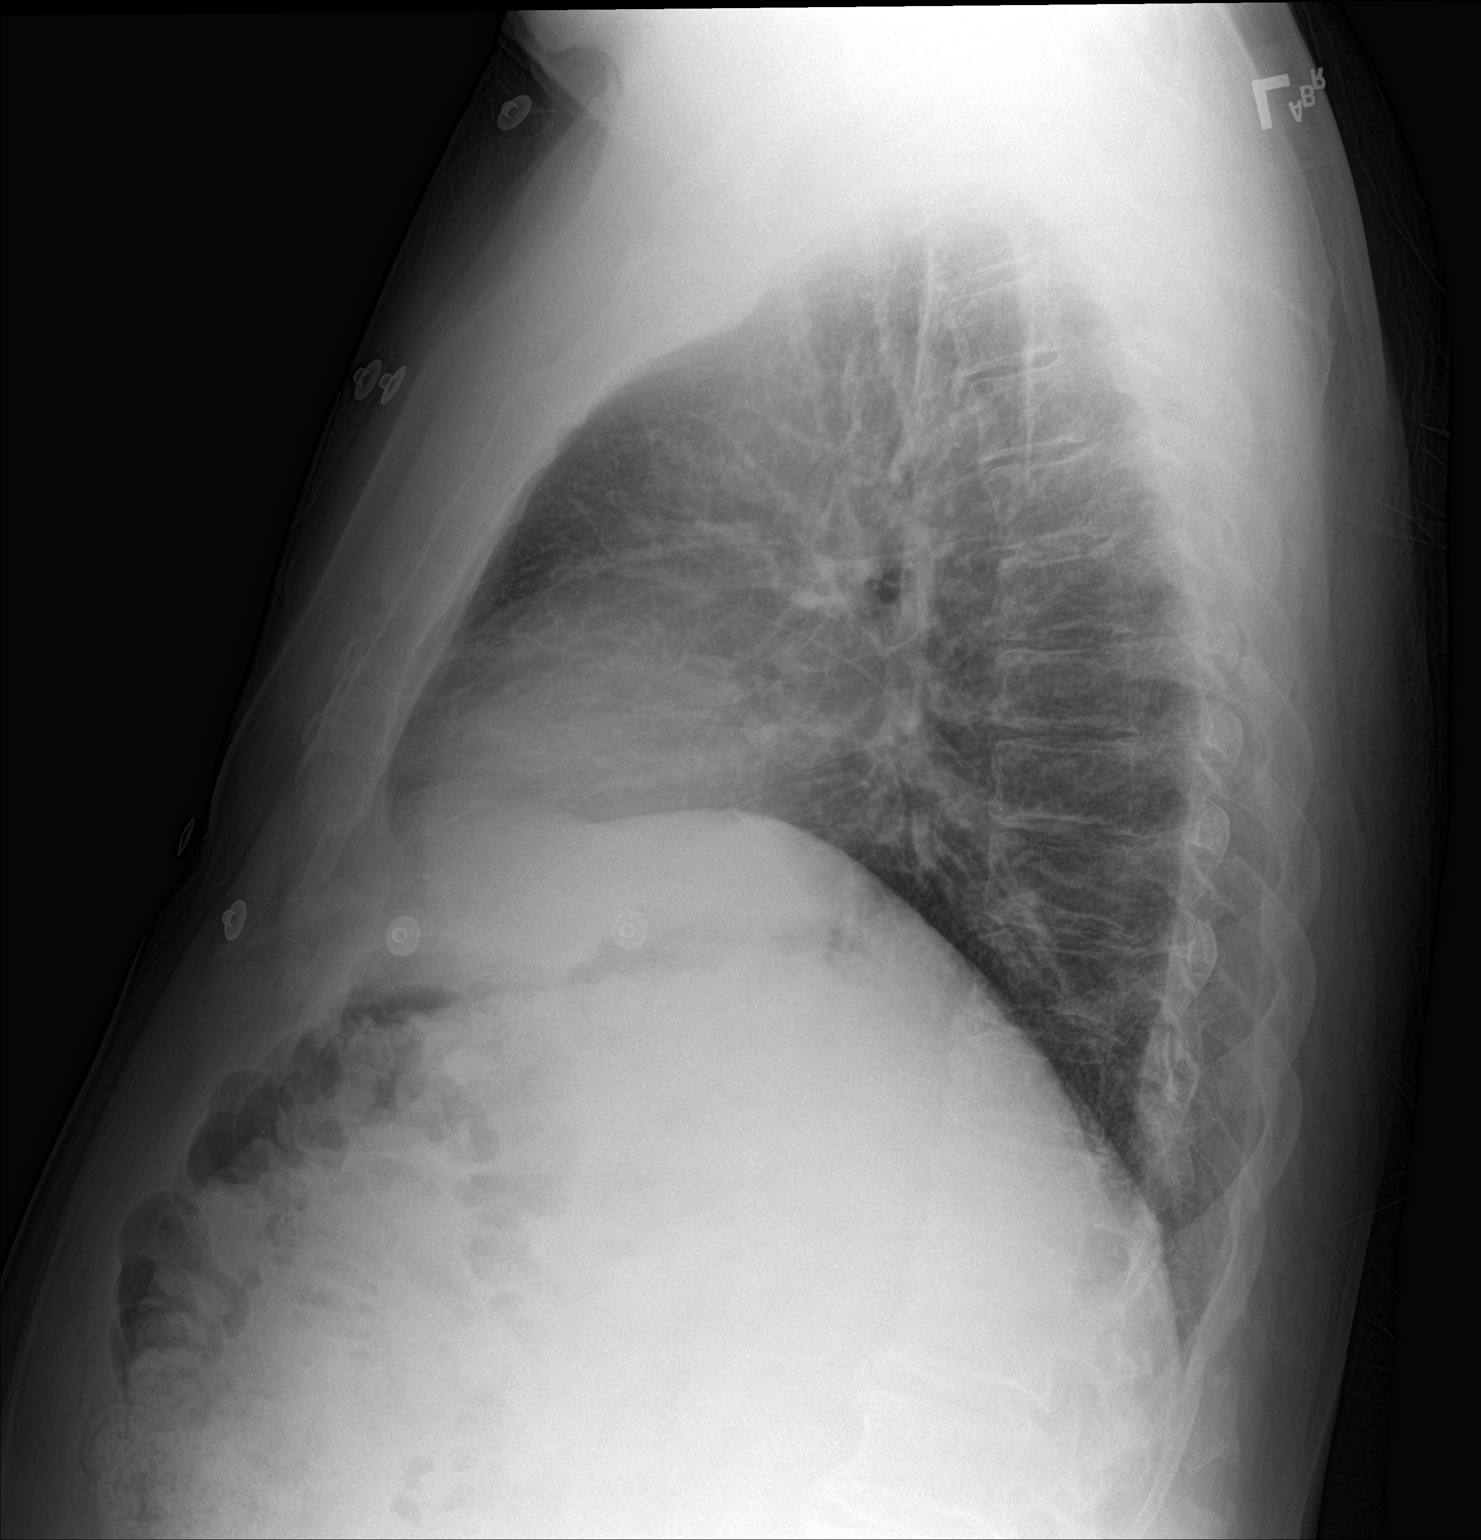

[2 of 2 positions shown; findings below may reference images not displayed]

FINDINGS: Anterior cervical fusion. Normal cardiac silhouette. No effusion,
infiltrate pneumothorax. subtle linear interstitial markings extend
to the peripheral lung. No acute osseous abnormality.
IMPRESSION: Mild interstitial edema pattern.

## 2020-01-22 DIAGNOSIS — Z23 Encounter for immunization: Secondary | ICD-10-CM | POA: Diagnosis not present

## 2020-02-19 DIAGNOSIS — Z23 Encounter for immunization: Secondary | ICD-10-CM | POA: Diagnosis not present

## 2020-04-06 DIAGNOSIS — N182 Chronic kidney disease, stage 2 (mild): Secondary | ICD-10-CM | POA: Diagnosis not present

## 2020-04-06 DIAGNOSIS — Z1331 Encounter for screening for depression: Secondary | ICD-10-CM | POA: Diagnosis not present

## 2020-04-06 DIAGNOSIS — Z0000 Encounter for general adult medical examination without abnormal findings: Secondary | ICD-10-CM | POA: Diagnosis not present

## 2020-04-06 DIAGNOSIS — I10 Essential (primary) hypertension: Secondary | ICD-10-CM | POA: Diagnosis not present

## 2020-04-06 DIAGNOSIS — E669 Obesity, unspecified: Secondary | ICD-10-CM | POA: Diagnosis not present

## 2020-04-06 DIAGNOSIS — E782 Mixed hyperlipidemia: Secondary | ICD-10-CM | POA: Diagnosis not present

## 2020-04-08 DIAGNOSIS — Z0000 Encounter for general adult medical examination without abnormal findings: Secondary | ICD-10-CM | POA: Diagnosis not present

## 2020-04-08 DIAGNOSIS — N182 Chronic kidney disease, stage 2 (mild): Secondary | ICD-10-CM | POA: Diagnosis not present

## 2020-04-08 DIAGNOSIS — Z125 Encounter for screening for malignant neoplasm of prostate: Secondary | ICD-10-CM | POA: Diagnosis not present

## 2020-04-08 DIAGNOSIS — I10 Essential (primary) hypertension: Secondary | ICD-10-CM | POA: Diagnosis not present

## 2020-04-08 DIAGNOSIS — E669 Obesity, unspecified: Secondary | ICD-10-CM | POA: Diagnosis not present

## 2020-04-08 DIAGNOSIS — E782 Mixed hyperlipidemia: Secondary | ICD-10-CM | POA: Diagnosis not present

## 2020-08-03 DIAGNOSIS — E669 Obesity, unspecified: Secondary | ICD-10-CM | POA: Diagnosis not present

## 2020-08-03 DIAGNOSIS — I2510 Atherosclerotic heart disease of native coronary artery without angina pectoris: Secondary | ICD-10-CM | POA: Diagnosis not present

## 2020-08-03 DIAGNOSIS — N182 Chronic kidney disease, stage 2 (mild): Secondary | ICD-10-CM | POA: Diagnosis not present

## 2020-08-03 DIAGNOSIS — I10 Essential (primary) hypertension: Secondary | ICD-10-CM | POA: Diagnosis not present

## 2020-08-03 DIAGNOSIS — E782 Mixed hyperlipidemia: Secondary | ICD-10-CM | POA: Diagnosis not present

## 2020-12-16 DIAGNOSIS — E669 Obesity, unspecified: Secondary | ICD-10-CM | POA: Diagnosis not present

## 2020-12-16 DIAGNOSIS — I10 Essential (primary) hypertension: Secondary | ICD-10-CM | POA: Diagnosis not present

## 2020-12-16 DIAGNOSIS — Z6836 Body mass index (BMI) 36.0-36.9, adult: Secondary | ICD-10-CM | POA: Diagnosis not present

## 2020-12-16 DIAGNOSIS — N182 Chronic kidney disease, stage 2 (mild): Secondary | ICD-10-CM | POA: Diagnosis not present

## 2020-12-16 DIAGNOSIS — I2510 Atherosclerotic heart disease of native coronary artery without angina pectoris: Secondary | ICD-10-CM | POA: Diagnosis not present

## 2020-12-16 DIAGNOSIS — E782 Mixed hyperlipidemia: Secondary | ICD-10-CM | POA: Diagnosis not present

## 2021-01-20 DIAGNOSIS — E782 Mixed hyperlipidemia: Secondary | ICD-10-CM | POA: Diagnosis not present

## 2021-01-20 DIAGNOSIS — E669 Obesity, unspecified: Secondary | ICD-10-CM | POA: Diagnosis not present

## 2021-01-20 DIAGNOSIS — I10 Essential (primary) hypertension: Secondary | ICD-10-CM | POA: Diagnosis not present

## 2021-01-20 DIAGNOSIS — N182 Chronic kidney disease, stage 2 (mild): Secondary | ICD-10-CM | POA: Diagnosis not present

## 2021-03-08 DIAGNOSIS — N182 Chronic kidney disease, stage 2 (mild): Secondary | ICD-10-CM | POA: Diagnosis not present

## 2021-03-08 DIAGNOSIS — E782 Mixed hyperlipidemia: Secondary | ICD-10-CM | POA: Diagnosis not present

## 2021-03-08 DIAGNOSIS — I10 Essential (primary) hypertension: Secondary | ICD-10-CM | POA: Diagnosis not present

## 2021-04-13 DIAGNOSIS — I2510 Atherosclerotic heart disease of native coronary artery without angina pectoris: Secondary | ICD-10-CM | POA: Diagnosis not present

## 2021-04-13 DIAGNOSIS — I10 Essential (primary) hypertension: Secondary | ICD-10-CM | POA: Diagnosis not present

## 2021-04-13 DIAGNOSIS — Z6835 Body mass index (BMI) 35.0-35.9, adult: Secondary | ICD-10-CM | POA: Diagnosis not present

## 2021-04-13 DIAGNOSIS — Z125 Encounter for screening for malignant neoplasm of prostate: Secondary | ICD-10-CM | POA: Diagnosis not present

## 2021-04-13 DIAGNOSIS — N182 Chronic kidney disease, stage 2 (mild): Secondary | ICD-10-CM | POA: Diagnosis not present

## 2021-04-13 DIAGNOSIS — E782 Mixed hyperlipidemia: Secondary | ICD-10-CM | POA: Diagnosis not present

## 2021-04-13 DIAGNOSIS — E669 Obesity, unspecified: Secondary | ICD-10-CM | POA: Diagnosis not present

## 2021-04-14 DIAGNOSIS — M7731 Calcaneal spur, right foot: Secondary | ICD-10-CM | POA: Diagnosis not present

## 2021-04-14 DIAGNOSIS — M79671 Pain in right foot: Secondary | ICD-10-CM | POA: Diagnosis not present

## 2021-06-08 DIAGNOSIS — I10 Essential (primary) hypertension: Secondary | ICD-10-CM | POA: Diagnosis not present

## 2021-06-08 DIAGNOSIS — N182 Chronic kidney disease, stage 2 (mild): Secondary | ICD-10-CM | POA: Diagnosis not present

## 2021-06-08 DIAGNOSIS — E782 Mixed hyperlipidemia: Secondary | ICD-10-CM | POA: Diagnosis not present

## 2021-08-16 DIAGNOSIS — Z0000 Encounter for general adult medical examination without abnormal findings: Secondary | ICD-10-CM | POA: Diagnosis not present

## 2021-08-16 DIAGNOSIS — Z6835 Body mass index (BMI) 35.0-35.9, adult: Secondary | ICD-10-CM | POA: Diagnosis not present

## 2021-08-29 DIAGNOSIS — H40013 Open angle with borderline findings, low risk, bilateral: Secondary | ICD-10-CM | POA: Diagnosis not present

## 2021-08-29 DIAGNOSIS — Z0100 Encounter for examination of eyes and vision without abnormal findings: Secondary | ICD-10-CM | POA: Diagnosis not present

## 2021-08-29 DIAGNOSIS — I10 Essential (primary) hypertension: Secondary | ICD-10-CM | POA: Diagnosis not present

## 2021-08-29 DIAGNOSIS — H524 Presbyopia: Secondary | ICD-10-CM | POA: Diagnosis not present

## 2021-11-08 DIAGNOSIS — E7849 Other hyperlipidemia: Secondary | ICD-10-CM | POA: Diagnosis not present

## 2021-11-08 DIAGNOSIS — I10 Essential (primary) hypertension: Secondary | ICD-10-CM | POA: Diagnosis not present

## 2021-12-13 DIAGNOSIS — I2510 Atherosclerotic heart disease of native coronary artery without angina pectoris: Secondary | ICD-10-CM | POA: Diagnosis not present

## 2021-12-13 DIAGNOSIS — I10 Essential (primary) hypertension: Secondary | ICD-10-CM | POA: Diagnosis not present

## 2021-12-13 DIAGNOSIS — N182 Chronic kidney disease, stage 2 (mild): Secondary | ICD-10-CM | POA: Diagnosis not present

## 2021-12-13 DIAGNOSIS — E669 Obesity, unspecified: Secondary | ICD-10-CM | POA: Diagnosis not present

## 2021-12-13 DIAGNOSIS — E782 Mixed hyperlipidemia: Secondary | ICD-10-CM | POA: Diagnosis not present

## 2021-12-13 DIAGNOSIS — Z6835 Body mass index (BMI) 35.0-35.9, adult: Secondary | ICD-10-CM | POA: Diagnosis not present

## 2022-04-11 DIAGNOSIS — N182 Chronic kidney disease, stage 2 (mild): Secondary | ICD-10-CM | POA: Diagnosis not present

## 2022-04-11 DIAGNOSIS — Z0000 Encounter for general adult medical examination without abnormal findings: Secondary | ICD-10-CM | POA: Diagnosis not present

## 2022-04-11 DIAGNOSIS — E669 Obesity, unspecified: Secondary | ICD-10-CM | POA: Diagnosis not present

## 2022-04-11 DIAGNOSIS — I10 Essential (primary) hypertension: Secondary | ICD-10-CM | POA: Diagnosis not present

## 2022-04-11 DIAGNOSIS — Z6835 Body mass index (BMI) 35.0-35.9, adult: Secondary | ICD-10-CM | POA: Diagnosis not present

## 2022-04-11 DIAGNOSIS — I2510 Atherosclerotic heart disease of native coronary artery without angina pectoris: Secondary | ICD-10-CM | POA: Diagnosis not present

## 2022-04-11 DIAGNOSIS — Z125 Encounter for screening for malignant neoplasm of prostate: Secondary | ICD-10-CM | POA: Diagnosis not present

## 2022-04-11 DIAGNOSIS — E782 Mixed hyperlipidemia: Secondary | ICD-10-CM | POA: Diagnosis not present

## 2022-04-14 DIAGNOSIS — I700 Atherosclerosis of aorta: Secondary | ICD-10-CM | POA: Diagnosis not present

## 2022-04-14 DIAGNOSIS — K859 Acute pancreatitis without necrosis or infection, unspecified: Secondary | ICD-10-CM | POA: Diagnosis not present

## 2022-04-14 DIAGNOSIS — K429 Umbilical hernia without obstruction or gangrene: Secondary | ICD-10-CM | POA: Diagnosis not present

## 2022-04-14 DIAGNOSIS — R109 Unspecified abdominal pain: Secondary | ICD-10-CM | POA: Diagnosis not present

## 2022-04-14 DIAGNOSIS — K5730 Diverticulosis of large intestine without perforation or abscess without bleeding: Secondary | ICD-10-CM | POA: Diagnosis not present

## 2022-04-14 DIAGNOSIS — K760 Fatty (change of) liver, not elsewhere classified: Secondary | ICD-10-CM | POA: Diagnosis not present

## 2022-08-21 DIAGNOSIS — Z6834 Body mass index (BMI) 34.0-34.9, adult: Secondary | ICD-10-CM | POA: Diagnosis not present

## 2022-08-21 DIAGNOSIS — N182 Chronic kidney disease, stage 2 (mild): Secondary | ICD-10-CM | POA: Diagnosis not present

## 2022-08-21 DIAGNOSIS — E669 Obesity, unspecified: Secondary | ICD-10-CM | POA: Diagnosis not present

## 2022-08-21 DIAGNOSIS — I2510 Atherosclerotic heart disease of native coronary artery without angina pectoris: Secondary | ICD-10-CM | POA: Diagnosis not present

## 2022-08-21 DIAGNOSIS — I10 Essential (primary) hypertension: Secondary | ICD-10-CM | POA: Diagnosis not present

## 2022-08-21 DIAGNOSIS — Z0000 Encounter for general adult medical examination without abnormal findings: Secondary | ICD-10-CM | POA: Diagnosis not present

## 2022-08-21 DIAGNOSIS — E782 Mixed hyperlipidemia: Secondary | ICD-10-CM | POA: Diagnosis not present

## 2022-12-09 DIAGNOSIS — E785 Hyperlipidemia, unspecified: Secondary | ICD-10-CM | POA: Diagnosis not present

## 2022-12-09 DIAGNOSIS — R1110 Vomiting, unspecified: Secondary | ICD-10-CM | POA: Diagnosis not present

## 2022-12-09 DIAGNOSIS — J45909 Unspecified asthma, uncomplicated: Secondary | ICD-10-CM | POA: Diagnosis not present

## 2022-12-09 DIAGNOSIS — Z1152 Encounter for screening for COVID-19: Secondary | ICD-10-CM | POA: Diagnosis not present

## 2022-12-09 DIAGNOSIS — L42 Pityriasis rosea: Secondary | ICD-10-CM | POA: Diagnosis not present

## 2022-12-09 DIAGNOSIS — Z20822 Contact with and (suspected) exposure to covid-19: Secondary | ICD-10-CM | POA: Diagnosis not present

## 2022-12-09 DIAGNOSIS — J069 Acute upper respiratory infection, unspecified: Secondary | ICD-10-CM | POA: Diagnosis not present

## 2022-12-09 DIAGNOSIS — I10 Essential (primary) hypertension: Secondary | ICD-10-CM | POA: Diagnosis not present

## 2022-12-12 DIAGNOSIS — R3589 Other polyuria: Secondary | ICD-10-CM | POA: Diagnosis not present

## 2022-12-12 DIAGNOSIS — Z6835 Body mass index (BMI) 35.0-35.9, adult: Secondary | ICD-10-CM | POA: Diagnosis not present

## 2022-12-12 DIAGNOSIS — Z131 Encounter for screening for diabetes mellitus: Secondary | ICD-10-CM | POA: Diagnosis not present

## 2022-12-12 DIAGNOSIS — J0100 Acute maxillary sinusitis, unspecified: Secondary | ICD-10-CM | POA: Diagnosis not present

## 2023-03-11 DIAGNOSIS — R3589 Other polyuria: Secondary | ICD-10-CM | POA: Diagnosis not present

## 2023-03-11 DIAGNOSIS — E782 Mixed hyperlipidemia: Secondary | ICD-10-CM | POA: Diagnosis not present

## 2023-03-11 DIAGNOSIS — E1121 Type 2 diabetes mellitus with diabetic nephropathy: Secondary | ICD-10-CM | POA: Diagnosis not present

## 2023-03-11 DIAGNOSIS — N181 Chronic kidney disease, stage 1: Secondary | ICD-10-CM | POA: Diagnosis not present

## 2023-03-11 DIAGNOSIS — I10 Essential (primary) hypertension: Secondary | ICD-10-CM | POA: Diagnosis not present

## 2023-03-11 DIAGNOSIS — I2510 Atherosclerotic heart disease of native coronary artery without angina pectoris: Secondary | ICD-10-CM | POA: Diagnosis not present

## 2023-03-11 DIAGNOSIS — G63 Polyneuropathy in diseases classified elsewhere: Secondary | ICD-10-CM | POA: Diagnosis not present

## 2023-03-11 DIAGNOSIS — Z6834 Body mass index (BMI) 34.0-34.9, adult: Secondary | ICD-10-CM | POA: Diagnosis not present

## 2023-03-11 DIAGNOSIS — E1151 Type 2 diabetes mellitus with diabetic peripheral angiopathy without gangrene: Secondary | ICD-10-CM | POA: Diagnosis not present

## 2023-07-11 DIAGNOSIS — Z6834 Body mass index (BMI) 34.0-34.9, adult: Secondary | ICD-10-CM | POA: Diagnosis not present

## 2023-07-11 DIAGNOSIS — I10 Essential (primary) hypertension: Secondary | ICD-10-CM | POA: Diagnosis not present

## 2023-07-11 DIAGNOSIS — Z125 Encounter for screening for malignant neoplasm of prostate: Secondary | ICD-10-CM | POA: Diagnosis not present

## 2023-07-11 DIAGNOSIS — G63 Polyneuropathy in diseases classified elsewhere: Secondary | ICD-10-CM | POA: Diagnosis not present

## 2023-07-11 DIAGNOSIS — E1121 Type 2 diabetes mellitus with diabetic nephropathy: Secondary | ICD-10-CM | POA: Diagnosis not present

## 2023-07-11 DIAGNOSIS — E1151 Type 2 diabetes mellitus with diabetic peripheral angiopathy without gangrene: Secondary | ICD-10-CM | POA: Diagnosis not present

## 2023-07-11 DIAGNOSIS — E782 Mixed hyperlipidemia: Secondary | ICD-10-CM | POA: Diagnosis not present

## 2023-07-11 DIAGNOSIS — R3589 Other polyuria: Secondary | ICD-10-CM | POA: Diagnosis not present

## 2023-07-11 DIAGNOSIS — N181 Chronic kidney disease, stage 1: Secondary | ICD-10-CM | POA: Diagnosis not present

## 2023-07-11 DIAGNOSIS — I2510 Atherosclerotic heart disease of native coronary artery without angina pectoris: Secondary | ICD-10-CM | POA: Diagnosis not present

## 2023-11-11 DIAGNOSIS — I2510 Atherosclerotic heart disease of native coronary artery without angina pectoris: Secondary | ICD-10-CM | POA: Diagnosis not present

## 2023-11-11 DIAGNOSIS — E1122 Type 2 diabetes mellitus with diabetic chronic kidney disease: Secondary | ICD-10-CM | POA: Diagnosis not present

## 2023-11-11 DIAGNOSIS — G63 Polyneuropathy in diseases classified elsewhere: Secondary | ICD-10-CM | POA: Diagnosis not present

## 2023-11-11 DIAGNOSIS — Z0000 Encounter for general adult medical examination without abnormal findings: Secondary | ICD-10-CM | POA: Diagnosis not present

## 2023-11-11 DIAGNOSIS — E782 Mixed hyperlipidemia: Secondary | ICD-10-CM | POA: Diagnosis not present

## 2023-11-11 DIAGNOSIS — Z6834 Body mass index (BMI) 34.0-34.9, adult: Secondary | ICD-10-CM | POA: Diagnosis not present

## 2023-11-11 DIAGNOSIS — N182 Chronic kidney disease, stage 2 (mild): Secondary | ICD-10-CM | POA: Diagnosis not present

## 2023-11-11 DIAGNOSIS — I10 Essential (primary) hypertension: Secondary | ICD-10-CM | POA: Diagnosis not present

## 2023-11-14 DIAGNOSIS — R809 Proteinuria, unspecified: Secondary | ICD-10-CM | POA: Diagnosis not present

## 2024-03-09 DIAGNOSIS — Z6833 Body mass index (BMI) 33.0-33.9, adult: Secondary | ICD-10-CM | POA: Diagnosis not present

## 2024-03-09 DIAGNOSIS — N182 Chronic kidney disease, stage 2 (mild): Secondary | ICD-10-CM | POA: Diagnosis not present

## 2024-03-09 DIAGNOSIS — G63 Polyneuropathy in diseases classified elsewhere: Secondary | ICD-10-CM | POA: Diagnosis not present

## 2024-03-09 DIAGNOSIS — I10 Essential (primary) hypertension: Secondary | ICD-10-CM | POA: Diagnosis not present

## 2024-03-09 DIAGNOSIS — E1122 Type 2 diabetes mellitus with diabetic chronic kidney disease: Secondary | ICD-10-CM | POA: Diagnosis not present

## 2024-03-09 DIAGNOSIS — I2510 Atherosclerotic heart disease of native coronary artery without angina pectoris: Secondary | ICD-10-CM | POA: Diagnosis not present

## 2024-03-09 DIAGNOSIS — E782 Mixed hyperlipidemia: Secondary | ICD-10-CM | POA: Diagnosis not present

## 2024-03-09 DIAGNOSIS — Z0000 Encounter for general adult medical examination without abnormal findings: Secondary | ICD-10-CM | POA: Diagnosis not present

## 2024-03-16 DIAGNOSIS — M9902 Segmental and somatic dysfunction of thoracic region: Secondary | ICD-10-CM | POA: Diagnosis not present

## 2024-03-16 DIAGNOSIS — M9903 Segmental and somatic dysfunction of lumbar region: Secondary | ICD-10-CM | POA: Diagnosis not present

## 2024-03-16 DIAGNOSIS — M47812 Spondylosis without myelopathy or radiculopathy, cervical region: Secondary | ICD-10-CM | POA: Diagnosis not present

## 2024-03-16 DIAGNOSIS — M9901 Segmental and somatic dysfunction of cervical region: Secondary | ICD-10-CM | POA: Diagnosis not present

## 2024-03-16 DIAGNOSIS — S233XXA Sprain of ligaments of thoracic spine, initial encounter: Secondary | ICD-10-CM | POA: Diagnosis not present

## 2024-03-16 DIAGNOSIS — M47816 Spondylosis without myelopathy or radiculopathy, lumbar region: Secondary | ICD-10-CM | POA: Diagnosis not present

## 2024-03-16 DIAGNOSIS — S335XXA Sprain of ligaments of lumbar spine, initial encounter: Secondary | ICD-10-CM | POA: Diagnosis not present

## 2024-03-19 DIAGNOSIS — M47812 Spondylosis without myelopathy or radiculopathy, cervical region: Secondary | ICD-10-CM | POA: Diagnosis not present

## 2024-03-19 DIAGNOSIS — E119 Type 2 diabetes mellitus without complications: Secondary | ICD-10-CM | POA: Diagnosis not present

## 2024-03-19 DIAGNOSIS — M9901 Segmental and somatic dysfunction of cervical region: Secondary | ICD-10-CM | POA: Diagnosis not present

## 2024-03-19 DIAGNOSIS — M47816 Spondylosis without myelopathy or radiculopathy, lumbar region: Secondary | ICD-10-CM | POA: Diagnosis not present

## 2024-03-19 DIAGNOSIS — S233XXA Sprain of ligaments of thoracic spine, initial encounter: Secondary | ICD-10-CM | POA: Diagnosis not present

## 2024-03-19 DIAGNOSIS — S335XXA Sprain of ligaments of lumbar spine, initial encounter: Secondary | ICD-10-CM | POA: Diagnosis not present

## 2024-03-19 DIAGNOSIS — H40023 Open angle with borderline findings, high risk, bilateral: Secondary | ICD-10-CM | POA: Diagnosis not present

## 2024-03-19 DIAGNOSIS — M9902 Segmental and somatic dysfunction of thoracic region: Secondary | ICD-10-CM | POA: Diagnosis not present

## 2024-03-19 DIAGNOSIS — M9903 Segmental and somatic dysfunction of lumbar region: Secondary | ICD-10-CM | POA: Diagnosis not present

## 2024-03-23 DIAGNOSIS — Z800 Family history of malignant neoplasm of digestive organs: Secondary | ICD-10-CM | POA: Diagnosis not present

## 2024-03-23 DIAGNOSIS — Z09 Encounter for follow-up examination after completed treatment for conditions other than malignant neoplasm: Secondary | ICD-10-CM | POA: Diagnosis not present

## 2024-03-26 DIAGNOSIS — M9902 Segmental and somatic dysfunction of thoracic region: Secondary | ICD-10-CM | POA: Diagnosis not present

## 2024-03-26 DIAGNOSIS — M47812 Spondylosis without myelopathy or radiculopathy, cervical region: Secondary | ICD-10-CM | POA: Diagnosis not present

## 2024-03-26 DIAGNOSIS — M9901 Segmental and somatic dysfunction of cervical region: Secondary | ICD-10-CM | POA: Diagnosis not present

## 2024-03-26 DIAGNOSIS — S233XXA Sprain of ligaments of thoracic spine, initial encounter: Secondary | ICD-10-CM | POA: Diagnosis not present

## 2024-03-26 DIAGNOSIS — M47816 Spondylosis without myelopathy or radiculopathy, lumbar region: Secondary | ICD-10-CM | POA: Diagnosis not present

## 2024-03-26 DIAGNOSIS — M9903 Segmental and somatic dysfunction of lumbar region: Secondary | ICD-10-CM | POA: Diagnosis not present

## 2024-03-26 DIAGNOSIS — S335XXA Sprain of ligaments of lumbar spine, initial encounter: Secondary | ICD-10-CM | POA: Diagnosis not present

## 2024-03-31 DIAGNOSIS — M9903 Segmental and somatic dysfunction of lumbar region: Secondary | ICD-10-CM | POA: Diagnosis not present

## 2024-03-31 DIAGNOSIS — M9902 Segmental and somatic dysfunction of thoracic region: Secondary | ICD-10-CM | POA: Diagnosis not present

## 2024-03-31 DIAGNOSIS — S335XXA Sprain of ligaments of lumbar spine, initial encounter: Secondary | ICD-10-CM | POA: Diagnosis not present

## 2024-03-31 DIAGNOSIS — M9901 Segmental and somatic dysfunction of cervical region: Secondary | ICD-10-CM | POA: Diagnosis not present

## 2024-03-31 DIAGNOSIS — M47812 Spondylosis without myelopathy or radiculopathy, cervical region: Secondary | ICD-10-CM | POA: Diagnosis not present

## 2024-03-31 DIAGNOSIS — S233XXA Sprain of ligaments of thoracic spine, initial encounter: Secondary | ICD-10-CM | POA: Diagnosis not present

## 2024-03-31 DIAGNOSIS — M47816 Spondylosis without myelopathy or radiculopathy, lumbar region: Secondary | ICD-10-CM | POA: Diagnosis not present

## 2024-04-06 DIAGNOSIS — M9901 Segmental and somatic dysfunction of cervical region: Secondary | ICD-10-CM | POA: Diagnosis not present

## 2024-04-06 DIAGNOSIS — M9902 Segmental and somatic dysfunction of thoracic region: Secondary | ICD-10-CM | POA: Diagnosis not present

## 2024-04-06 DIAGNOSIS — M47816 Spondylosis without myelopathy or radiculopathy, lumbar region: Secondary | ICD-10-CM | POA: Diagnosis not present

## 2024-04-06 DIAGNOSIS — S335XXA Sprain of ligaments of lumbar spine, initial encounter: Secondary | ICD-10-CM | POA: Diagnosis not present

## 2024-04-06 DIAGNOSIS — M47812 Spondylosis without myelopathy or radiculopathy, cervical region: Secondary | ICD-10-CM | POA: Diagnosis not present

## 2024-04-06 DIAGNOSIS — S233XXA Sprain of ligaments of thoracic spine, initial encounter: Secondary | ICD-10-CM | POA: Diagnosis not present

## 2024-04-06 DIAGNOSIS — M9903 Segmental and somatic dysfunction of lumbar region: Secondary | ICD-10-CM | POA: Diagnosis not present

## 2024-04-08 DIAGNOSIS — M8589 Other specified disorders of bone density and structure, multiple sites: Secondary | ICD-10-CM | POA: Diagnosis not present

## 2024-04-08 DIAGNOSIS — M810 Age-related osteoporosis without current pathological fracture: Secondary | ICD-10-CM | POA: Diagnosis not present

## 2024-04-15 DIAGNOSIS — M47812 Spondylosis without myelopathy or radiculopathy, cervical region: Secondary | ICD-10-CM | POA: Diagnosis not present

## 2024-04-15 DIAGNOSIS — M47816 Spondylosis without myelopathy or radiculopathy, lumbar region: Secondary | ICD-10-CM | POA: Diagnosis not present

## 2024-04-15 DIAGNOSIS — M9902 Segmental and somatic dysfunction of thoracic region: Secondary | ICD-10-CM | POA: Diagnosis not present

## 2024-04-15 DIAGNOSIS — M9903 Segmental and somatic dysfunction of lumbar region: Secondary | ICD-10-CM | POA: Diagnosis not present

## 2024-04-15 DIAGNOSIS — M9901 Segmental and somatic dysfunction of cervical region: Secondary | ICD-10-CM | POA: Diagnosis not present

## 2024-04-15 DIAGNOSIS — S233XXA Sprain of ligaments of thoracic spine, initial encounter: Secondary | ICD-10-CM | POA: Diagnosis not present

## 2024-04-15 DIAGNOSIS — S335XXA Sprain of ligaments of lumbar spine, initial encounter: Secondary | ICD-10-CM | POA: Diagnosis not present

## 2024-04-22 DIAGNOSIS — M9903 Segmental and somatic dysfunction of lumbar region: Secondary | ICD-10-CM | POA: Diagnosis not present

## 2024-04-22 DIAGNOSIS — M9901 Segmental and somatic dysfunction of cervical region: Secondary | ICD-10-CM | POA: Diagnosis not present

## 2024-04-22 DIAGNOSIS — M9902 Segmental and somatic dysfunction of thoracic region: Secondary | ICD-10-CM | POA: Diagnosis not present

## 2024-04-22 DIAGNOSIS — M47816 Spondylosis without myelopathy or radiculopathy, lumbar region: Secondary | ICD-10-CM | POA: Diagnosis not present

## 2024-04-22 DIAGNOSIS — S233XXA Sprain of ligaments of thoracic spine, initial encounter: Secondary | ICD-10-CM | POA: Diagnosis not present

## 2024-04-22 DIAGNOSIS — M47812 Spondylosis without myelopathy or radiculopathy, cervical region: Secondary | ICD-10-CM | POA: Diagnosis not present

## 2024-04-22 DIAGNOSIS — S335XXA Sprain of ligaments of lumbar spine, initial encounter: Secondary | ICD-10-CM | POA: Diagnosis not present

## 2024-05-14 DIAGNOSIS — E785 Hyperlipidemia, unspecified: Secondary | ICD-10-CM | POA: Diagnosis not present

## 2024-05-14 DIAGNOSIS — I2510 Atherosclerotic heart disease of native coronary artery without angina pectoris: Secondary | ICD-10-CM | POA: Diagnosis not present

## 2024-05-14 DIAGNOSIS — Z7982 Long term (current) use of aspirin: Secondary | ICD-10-CM | POA: Diagnosis not present

## 2024-05-14 DIAGNOSIS — R109 Unspecified abdominal pain: Secondary | ICD-10-CM | POA: Diagnosis not present

## 2024-05-14 DIAGNOSIS — Z7984 Long term (current) use of oral hypoglycemic drugs: Secondary | ICD-10-CM | POA: Diagnosis not present

## 2024-05-14 DIAGNOSIS — I10 Essential (primary) hypertension: Secondary | ICD-10-CM | POA: Diagnosis not present

## 2024-05-14 DIAGNOSIS — Z5309 Procedure and treatment not carried out because of other contraindication: Secondary | ICD-10-CM | POA: Diagnosis not present

## 2024-05-14 DIAGNOSIS — E119 Type 2 diabetes mellitus without complications: Secondary | ICD-10-CM | POA: Diagnosis not present

## 2024-07-06 DIAGNOSIS — E1122 Type 2 diabetes mellitus with diabetic chronic kidney disease: Secondary | ICD-10-CM | POA: Diagnosis not present

## 2024-07-06 DIAGNOSIS — E782 Mixed hyperlipidemia: Secondary | ICD-10-CM | POA: Diagnosis not present

## 2024-07-06 DIAGNOSIS — Z0000 Encounter for general adult medical examination without abnormal findings: Secondary | ICD-10-CM | POA: Diagnosis not present

## 2024-07-06 DIAGNOSIS — I10 Essential (primary) hypertension: Secondary | ICD-10-CM | POA: Diagnosis not present

## 2024-07-06 DIAGNOSIS — I2510 Atherosclerotic heart disease of native coronary artery without angina pectoris: Secondary | ICD-10-CM | POA: Diagnosis not present

## 2024-07-06 DIAGNOSIS — N1831 Chronic kidney disease, stage 3a: Secondary | ICD-10-CM | POA: Diagnosis not present

## 2024-07-06 DIAGNOSIS — Z6833 Body mass index (BMI) 33.0-33.9, adult: Secondary | ICD-10-CM | POA: Diagnosis not present

## 2024-07-06 DIAGNOSIS — G63 Polyneuropathy in diseases classified elsewhere: Secondary | ICD-10-CM | POA: Diagnosis not present

## 2024-11-09 DIAGNOSIS — I10 Essential (primary) hypertension: Secondary | ICD-10-CM | POA: Diagnosis not present

## 2024-11-09 DIAGNOSIS — E1122 Type 2 diabetes mellitus with diabetic chronic kidney disease: Secondary | ICD-10-CM | POA: Diagnosis not present

## 2024-11-09 DIAGNOSIS — Z125 Encounter for screening for malignant neoplasm of prostate: Secondary | ICD-10-CM | POA: Diagnosis not present

## 2024-11-09 DIAGNOSIS — N1831 Chronic kidney disease, stage 3a: Secondary | ICD-10-CM | POA: Diagnosis not present

## 2024-11-09 DIAGNOSIS — Z6834 Body mass index (BMI) 34.0-34.9, adult: Secondary | ICD-10-CM | POA: Diagnosis not present

## 2024-11-09 DIAGNOSIS — I2510 Atherosclerotic heart disease of native coronary artery without angina pectoris: Secondary | ICD-10-CM | POA: Diagnosis not present

## 2024-11-09 DIAGNOSIS — G63 Polyneuropathy in diseases classified elsewhere: Secondary | ICD-10-CM | POA: Diagnosis not present

## 2024-11-09 DIAGNOSIS — E782 Mixed hyperlipidemia: Secondary | ICD-10-CM | POA: Diagnosis not present
